# Patient Record
Sex: Male | Born: 1950 | Race: Black or African American | Hispanic: No | Marital: Married | Smoking: Never smoker
Health system: Southern US, Community
[De-identification: ages and names within clinical notes are randomized; demographics above are authoritative.]

## PROBLEM LIST (undated history)

## (undated) DIAGNOSIS — M47812 Spondylosis without myelopathy or radiculopathy, cervical region: Secondary | ICD-10-CM

## (undated) DIAGNOSIS — K648 Other hemorrhoids: Secondary | ICD-10-CM

## (undated) DIAGNOSIS — G587 Mononeuritis multiplex: Secondary | ICD-10-CM

## (undated) DIAGNOSIS — M5417 Radiculopathy, lumbosacral region: Secondary | ICD-10-CM

## (undated) DIAGNOSIS — M1990 Unspecified osteoarthritis, unspecified site: Secondary | ICD-10-CM

## (undated) DIAGNOSIS — N189 Chronic kidney disease, unspecified: Secondary | ICD-10-CM

## (undated) DIAGNOSIS — M961 Postlaminectomy syndrome, not elsewhere classified: Secondary | ICD-10-CM

## (undated) DIAGNOSIS — F329 Major depressive disorder, single episode, unspecified: Secondary | ICD-10-CM

## (undated) DIAGNOSIS — T7840XA Allergy, unspecified, initial encounter: Secondary | ICD-10-CM

## (undated) DIAGNOSIS — F419 Anxiety disorder, unspecified: Secondary | ICD-10-CM

## (undated) DIAGNOSIS — F32A Depression, unspecified: Secondary | ICD-10-CM

## (undated) DIAGNOSIS — M4802 Spinal stenosis, cervical region: Secondary | ICD-10-CM

## (undated) DIAGNOSIS — J189 Pneumonia, unspecified organism: Secondary | ICD-10-CM

## (undated) DIAGNOSIS — I10 Essential (primary) hypertension: Secondary | ICD-10-CM

## (undated) HISTORY — DX: Unspecified osteoarthritis, unspecified site: M19.90

## (undated) HISTORY — DX: Essential (primary) hypertension: I10

## (undated) HISTORY — DX: Depression, unspecified: F32.A

## (undated) HISTORY — DX: Spondylosis without myelopathy or radiculopathy, cervical region: M47.812

## (undated) HISTORY — DX: Postlaminectomy syndrome, not elsewhere classified: M96.1

## (undated) HISTORY — PX: LUMBAR FUSION: SHX111

## (undated) HISTORY — DX: Anxiety disorder, unspecified: F41.9

## (undated) HISTORY — DX: Mononeuritis multiplex: G58.7

## (undated) HISTORY — DX: Major depressive disorder, single episode, unspecified: F32.9

## (undated) HISTORY — DX: Other hemorrhoids: K64.8

## (undated) HISTORY — DX: Spinal stenosis, cervical region: M48.02

## (undated) HISTORY — DX: Chronic kidney disease, unspecified: N18.9

## (undated) HISTORY — DX: Radiculopathy, lumbosacral region: M54.17

## (undated) HISTORY — DX: Allergy, unspecified, initial encounter: T78.40XA

---

## 1998-04-27 ENCOUNTER — Other Ambulatory Visit: Admission: RE | Admit: 1998-04-27 | Discharge: 1998-04-27 | Payer: Self-pay | Admitting: Gastroenterology

## 1999-03-10 ENCOUNTER — Ambulatory Visit (HOSPITAL_COMMUNITY): Admission: RE | Admit: 1999-03-10 | Discharge: 1999-03-10 | Payer: Self-pay | Admitting: Neurosurgery

## 1999-03-24 ENCOUNTER — Ambulatory Visit (HOSPITAL_COMMUNITY): Admission: RE | Admit: 1999-03-24 | Discharge: 1999-03-24 | Payer: Self-pay | Admitting: Neurosurgery

## 1999-04-07 ENCOUNTER — Ambulatory Visit (HOSPITAL_COMMUNITY): Admission: RE | Admit: 1999-04-07 | Discharge: 1999-04-07 | Payer: Self-pay | Admitting: Neurosurgery

## 1999-08-03 ENCOUNTER — Inpatient Hospital Stay (HOSPITAL_COMMUNITY): Admission: RE | Admit: 1999-08-03 | Discharge: 1999-08-10 | Payer: Self-pay | Admitting: Neurosurgery

## 1999-08-04 ENCOUNTER — Encounter: Payer: Self-pay | Admitting: Neurosurgery

## 1999-09-06 ENCOUNTER — Encounter: Admission: RE | Admit: 1999-09-06 | Discharge: 1999-09-06 | Payer: Self-pay | Admitting: Neurosurgery

## 1999-11-28 ENCOUNTER — Encounter: Admission: RE | Admit: 1999-11-28 | Discharge: 1999-11-28 | Payer: Self-pay | Admitting: Neurosurgery

## 1999-12-04 ENCOUNTER — Encounter: Admission: RE | Admit: 1999-12-04 | Discharge: 2000-02-08 | Payer: Self-pay | Admitting: Neurosurgery

## 2000-02-06 ENCOUNTER — Encounter: Admission: RE | Admit: 2000-02-06 | Discharge: 2000-02-06 | Payer: Self-pay | Admitting: Neurosurgery

## 2000-07-19 ENCOUNTER — Encounter: Admission: RE | Admit: 2000-07-19 | Discharge: 2000-07-19 | Payer: Self-pay | Admitting: Neurosurgery

## 2000-07-30 ENCOUNTER — Ambulatory Visit (HOSPITAL_COMMUNITY): Admission: RE | Admit: 2000-07-30 | Discharge: 2000-07-30 | Payer: Self-pay | Admitting: Neurosurgery

## 2008-01-21 ENCOUNTER — Emergency Department (HOSPITAL_COMMUNITY): Admission: EM | Admit: 2008-01-21 | Discharge: 2008-01-21 | Payer: Self-pay | Admitting: *Deleted

## 2008-04-15 ENCOUNTER — Encounter: Admission: RE | Admit: 2008-04-15 | Discharge: 2008-04-15 | Payer: Self-pay | Admitting: Nephrology

## 2008-08-04 ENCOUNTER — Encounter: Admission: RE | Admit: 2008-08-04 | Discharge: 2008-08-04 | Payer: Self-pay | Admitting: Neurosurgery

## 2008-09-30 ENCOUNTER — Encounter: Admission: RE | Admit: 2008-09-30 | Discharge: 2008-09-30 | Payer: Self-pay | Admitting: Neurosurgery

## 2008-10-28 ENCOUNTER — Encounter: Admission: RE | Admit: 2008-10-28 | Discharge: 2008-12-14 | Payer: Self-pay | Admitting: Neurosurgery

## 2009-01-25 ENCOUNTER — Encounter
Admission: RE | Admit: 2009-01-25 | Discharge: 2009-04-25 | Payer: Self-pay | Admitting: Physical Medicine & Rehabilitation

## 2009-01-31 ENCOUNTER — Ambulatory Visit: Payer: Self-pay | Admitting: Physical Medicine & Rehabilitation

## 2009-02-28 ENCOUNTER — Ambulatory Visit: Payer: Self-pay | Admitting: Physical Medicine & Rehabilitation

## 2009-03-15 ENCOUNTER — Ambulatory Visit: Payer: Self-pay | Admitting: Physical Medicine & Rehabilitation

## 2009-04-12 ENCOUNTER — Ambulatory Visit: Payer: Self-pay | Admitting: Physical Medicine & Rehabilitation

## 2009-04-12 ENCOUNTER — Ambulatory Visit (HOSPITAL_COMMUNITY)
Admission: RE | Admit: 2009-04-12 | Discharge: 2009-04-12 | Payer: Self-pay | Admitting: Physical Medicine & Rehabilitation

## 2009-04-27 ENCOUNTER — Encounter: Admission: RE | Admit: 2009-04-27 | Discharge: 2009-04-27 | Payer: Self-pay | Admitting: Nephrology

## 2009-05-09 ENCOUNTER — Encounter
Admission: RE | Admit: 2009-05-09 | Discharge: 2009-06-09 | Payer: Self-pay | Admitting: Physical Medicine & Rehabilitation

## 2009-05-10 ENCOUNTER — Ambulatory Visit: Payer: Self-pay | Admitting: Physical Medicine & Rehabilitation

## 2009-06-09 ENCOUNTER — Ambulatory Visit: Payer: Self-pay | Admitting: Physical Medicine & Rehabilitation

## 2009-06-15 ENCOUNTER — Ambulatory Visit: Payer: Self-pay | Admitting: Vascular Surgery

## 2009-07-06 ENCOUNTER — Encounter
Admission: RE | Admit: 2009-07-06 | Discharge: 2009-10-04 | Payer: Self-pay | Admitting: Physical Medicine & Rehabilitation

## 2009-07-07 ENCOUNTER — Ambulatory Visit: Payer: Self-pay | Admitting: Physical Medicine & Rehabilitation

## 2009-08-03 ENCOUNTER — Ambulatory Visit: Payer: Self-pay | Admitting: Physical Medicine & Rehabilitation

## 2009-09-05 ENCOUNTER — Ambulatory Visit: Payer: Self-pay | Admitting: Physical Medicine & Rehabilitation

## 2009-09-27 ENCOUNTER — Encounter
Admission: RE | Admit: 2009-09-27 | Discharge: 2009-12-26 | Payer: Self-pay | Admitting: Physical Medicine & Rehabilitation

## 2009-10-04 ENCOUNTER — Ambulatory Visit: Payer: Self-pay | Admitting: Physical Medicine & Rehabilitation

## 2009-11-02 ENCOUNTER — Ambulatory Visit: Payer: Self-pay | Admitting: Physical Medicine & Rehabilitation

## 2009-11-30 ENCOUNTER — Ambulatory Visit: Payer: Self-pay | Admitting: Physical Medicine & Rehabilitation

## 2009-12-19 ENCOUNTER — Encounter
Admission: RE | Admit: 2009-12-19 | Discharge: 2010-03-19 | Payer: Self-pay | Admitting: Physical Medicine & Rehabilitation

## 2009-12-27 ENCOUNTER — Ambulatory Visit: Payer: Self-pay | Admitting: Physical Medicine & Rehabilitation

## 2010-01-03 ENCOUNTER — Ambulatory Visit (HOSPITAL_COMMUNITY)
Admission: RE | Admit: 2010-01-03 | Discharge: 2010-01-03 | Payer: Self-pay | Admitting: Physical Medicine & Rehabilitation

## 2010-01-27 ENCOUNTER — Ambulatory Visit: Payer: Self-pay | Admitting: Physical Medicine & Rehabilitation

## 2010-02-28 ENCOUNTER — Ambulatory Visit: Payer: Self-pay | Admitting: Physical Medicine & Rehabilitation

## 2010-03-22 ENCOUNTER — Encounter
Admission: RE | Admit: 2010-03-22 | Discharge: 2010-06-20 | Payer: Self-pay | Source: Home / Self Care | Attending: Physical Medicine & Rehabilitation | Admitting: Physical Medicine & Rehabilitation

## 2010-03-28 ENCOUNTER — Ambulatory Visit: Payer: Self-pay | Admitting: Physical Medicine & Rehabilitation

## 2010-04-28 ENCOUNTER — Ambulatory Visit: Payer: Self-pay | Admitting: Physical Medicine & Rehabilitation

## 2010-05-31 ENCOUNTER — Ambulatory Visit: Payer: Self-pay | Admitting: Physical Medicine & Rehabilitation

## 2010-06-28 ENCOUNTER — Encounter
Admission: RE | Admit: 2010-06-28 | Discharge: 2010-07-11 | Payer: Self-pay | Source: Home / Self Care | Attending: Physical Medicine & Rehabilitation | Admitting: Physical Medicine & Rehabilitation

## 2010-07-04 ENCOUNTER — Ambulatory Visit
Admission: RE | Admit: 2010-07-04 | Discharge: 2010-07-04 | Payer: Self-pay | Source: Home / Self Care | Attending: Physical Medicine & Rehabilitation | Admitting: Physical Medicine & Rehabilitation

## 2010-08-02 ENCOUNTER — Encounter: Payer: Medicaid Other | Attending: Physical Medicine & Rehabilitation

## 2010-08-02 ENCOUNTER — Ambulatory Visit (HOSPITAL_BASED_OUTPATIENT_CLINIC_OR_DEPARTMENT_OTHER): Payer: Medicaid Other

## 2010-08-02 DIAGNOSIS — M47812 Spondylosis without myelopathy or radiculopathy, cervical region: Secondary | ICD-10-CM

## 2010-08-02 DIAGNOSIS — M961 Postlaminectomy syndrome, not elsewhere classified: Secondary | ICD-10-CM

## 2010-08-02 DIAGNOSIS — E1149 Type 2 diabetes mellitus with other diabetic neurological complication: Secondary | ICD-10-CM | POA: Insufficient documentation

## 2010-08-02 DIAGNOSIS — IMO0002 Reserved for concepts with insufficient information to code with codable children: Secondary | ICD-10-CM

## 2010-08-02 DIAGNOSIS — M129 Arthropathy, unspecified: Secondary | ICD-10-CM | POA: Insufficient documentation

## 2010-08-02 DIAGNOSIS — M4802 Spinal stenosis, cervical region: Secondary | ICD-10-CM

## 2010-08-02 DIAGNOSIS — I10 Essential (primary) hypertension: Secondary | ICD-10-CM | POA: Insufficient documentation

## 2010-08-02 DIAGNOSIS — M359 Systemic involvement of connective tissue, unspecified: Secondary | ICD-10-CM | POA: Insufficient documentation

## 2010-09-01 ENCOUNTER — Encounter: Payer: Medicaid Other | Attending: Physical Medicine & Rehabilitation

## 2010-09-01 ENCOUNTER — Ambulatory Visit (HOSPITAL_BASED_OUTPATIENT_CLINIC_OR_DEPARTMENT_OTHER): Payer: Medicaid Other | Admitting: Physical Medicine & Rehabilitation

## 2010-09-01 DIAGNOSIS — I10 Essential (primary) hypertension: Secondary | ICD-10-CM | POA: Insufficient documentation

## 2010-09-01 DIAGNOSIS — M961 Postlaminectomy syndrome, not elsewhere classified: Secondary | ICD-10-CM

## 2010-09-01 DIAGNOSIS — R209 Unspecified disturbances of skin sensation: Secondary | ICD-10-CM

## 2010-09-01 DIAGNOSIS — E1149 Type 2 diabetes mellitus with other diabetic neurological complication: Secondary | ICD-10-CM | POA: Insufficient documentation

## 2010-09-01 DIAGNOSIS — M359 Systemic involvement of connective tissue, unspecified: Secondary | ICD-10-CM | POA: Insufficient documentation

## 2010-09-01 DIAGNOSIS — M47812 Spondylosis without myelopathy or radiculopathy, cervical region: Secondary | ICD-10-CM | POA: Insufficient documentation

## 2010-09-01 DIAGNOSIS — G587 Mononeuritis multiplex: Secondary | ICD-10-CM

## 2010-09-01 DIAGNOSIS — M129 Arthropathy, unspecified: Secondary | ICD-10-CM | POA: Insufficient documentation

## 2010-09-29 ENCOUNTER — Encounter: Payer: Medicaid Other | Attending: Neurosurgery | Admitting: Neurosurgery

## 2010-09-29 DIAGNOSIS — G8929 Other chronic pain: Secondary | ICD-10-CM | POA: Insufficient documentation

## 2010-09-29 DIAGNOSIS — M48061 Spinal stenosis, lumbar region without neurogenic claudication: Secondary | ICD-10-CM

## 2010-09-29 DIAGNOSIS — E119 Type 2 diabetes mellitus without complications: Secondary | ICD-10-CM | POA: Insufficient documentation

## 2010-09-29 DIAGNOSIS — M5430 Sciatica, unspecified side: Secondary | ICD-10-CM

## 2010-09-29 DIAGNOSIS — M961 Postlaminectomy syndrome, not elsewhere classified: Secondary | ICD-10-CM | POA: Insufficient documentation

## 2010-09-29 DIAGNOSIS — G5790 Unspecified mononeuropathy of unspecified lower limb: Secondary | ICD-10-CM | POA: Insufficient documentation

## 2010-09-29 DIAGNOSIS — M79609 Pain in unspecified limb: Secondary | ICD-10-CM | POA: Insufficient documentation

## 2010-09-29 DIAGNOSIS — M545 Low back pain, unspecified: Secondary | ICD-10-CM | POA: Insufficient documentation

## 2010-09-30 NOTE — Assessment & Plan Note (Signed)
The patient follows up today with chronic low back pain, lower extremity pain which is probably neuropathic.  CHIEF COMPLAINT:  Pain in lower extremities with numbness.  HISTORY:  This is a 60 year old man with diabetes.  He has been followed here for sometime for the above diagnoses.  He has a mononeuropathy multiplex pattern.  His rates his pain today at about 8.  General activity level is 3-4. His pain is worse in the morning.  He sleeps poor.  Walking, bending, and most activities increases pain.  Heat, rest, and medications help. With mobility, he walks without assistance.  He does have a slightly limp.  He does not climb steps and does not drive.  He transfers alone. He is not employed and he does most of his own ADLs and shopping. Neuropsych, he does complain of numbness, tingling, trouble walking, spasms, dizziness, confusion, depression, anxiety, and loss of taste and smell and some weakness.  REVIEW OF SYSTEMS:  Notable for some weight loss, high and low blood sugar fluctuations, constipation, poor appetite, sleep apnea, and wheezing.  PAST MEDICAL HISTORY:  Unchanged.  SOCIAL HISTORY:  Unchanged.  Pill counts today were correct.  No signs of aberrant behavior.  PHYSICAL EXAMINATION:  Vital Signs:  His blood pressure is 90/60, pulse 62, respirations 18, and O2 sats 100 on room air.  Neurologic:  Motor strength is about 4+/5 in the left lower extremity.  Right lower extremity is 5/5.  He can only flex to 45 degrees and extend about 5 degrees.  Loss of motion score today was 70.  He has mild tenderness to palpation across the lumbar spine, paraspinous musculature. Sensation appears to be diminished in lower extremities.  He has no new complaints.  He is stable as he is.  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  He will continue his Cymbalta and Neurontin as scheduled.  They did gave him refills today  of oxycodone 7.5/325 one p.o. t.i.d. 90 with no refill, and Opana ER 20 mg day 1 p.o. b.i.d. 60 with no refill.  We will see him back in a month.     Anthony Moran    RLW/MedQ D:  09/29/2010 14:03:16  T:  09/30/2010 03:31:49  Job #:  [id number]

## 2010-10-24 NOTE — Assessment & Plan Note (Signed)
OFFICE VISIT   Anthony Moran, Anthony Moran  DOB:  1950/10/10                                       06/15/2009  BJYNW#:[id number]   CHIEF COMPLAINT:  Bilateral lower extremity pain.   HISTORY OF PRESENT ILLNESS:  The patient is a 60 year old male referred  by Dr. Wynn Banker for bilateral lower extremity pain and occasional  swelling.  Chronic problems include chronic back pain, hypertension and  diabetes.  These are all currently controlled.  The patient denies  history of smoking.  The patient states that both legs hurt, with pain  starting in his back and then radiating down the back of his legs all  the way into his feet.  He has intermittent to constant numbness and  tingling in his right foot.  He also states that the muscles of his  right foot tightened up.  He states that this occurs to the point that  he cannot walk.  It can occur sometimes at rest, sometimes with walking.  The problems in both lower extremities have become slowly worse over the  last several years.  He has been followed for this by Dr. Wynn Banker  since October.  He had a lumbar fusion 10 years ago.  This was done by  Dr. Lovell Sheehan.  He apparently saw Dr. Lovell Sheehan recently, and they are  considering whether to do a redo lumbar operation.  He has now been  diagnosed with diabetes for 1 year.  He denies tobacco abuse.  He denies  any prior trauma to his lower extremities or prior history of DVT.  He  does not really complain of considerable swelling at this point.  He  does not describe classic claudication or rest pain type symptoms.   PAST MEDICAL HISTORY:  As mentioned above.   PAST SURGICAL HISTORY:  As mentioned above.   FAMILY HISTORY:  Negative.   SOCIAL HISTORY:  He is married and has 2 children.  He is not working  because of his overall disability from his back and legs.  He is a  nonsmoker, nonconsumer of alcohol.   REVIEW OF SYSTEMS:  A full 12-point review of systems was performed  with  the patient.  Please see intake referral form for details regarding  this.   MEDICATIONS:  Gabapentin, Opana, oxycodone, metformin, Hyzaar,  clonidine, Flomax, amlodipine and sulindac.   LABORATORY DATA:  He has no known drug allergies.   PHYSICAL EXAM:  Today, blood pressure is 140/78 in the left arm,  temperature is 97.6, heart rate 70 and regular.  HEENT:  Unremarkable.  Neck:  Has 2+ carotid pulses without lymphadenopathy or bruit.  He has  no JVD.  Chest:  Clear to auscultation.  Cardiac:  Regular rate rhythm  without murmur.  Abdomen:  Soft, nontender, nondistended.  No masses.  Extremities:  He has 2+ radial, femoral and dorsalis pedis pulses  bilaterally.  Neurologic:  Upper extremity and lower extremity motor  strength is 4/5 and symmetric bilaterally.  He has slight decreased  sensation in the right foot, and this extends up to the midportion of  the right leg.  He does have some decreased sensation in the left but  the right is worse.  Skin:  Has no ulcers or rashes.   He had bilateral ABIs performed today which had triphasic waveforms and  an ABI greater than  1 bilaterally, 1.14 on the left and 1.24 on the  right.   I had a lengthy discussion with the patient today.  I believe that the  problems in his lower extremities are probably multifactorial in nature  but most likely related to his back.  He has a normal arterial physical  exam and normal ABIs.  I do not believe this is related to a venous or  arterial problem.  He may have some mild neuropathy from his diabetes as  well.  This was diagnosed a year ago but could have been present for  some time prior to that, as the patient states he had not seen a doctor  in several years before this.  I believe the best option is tight  control of his diabetes, and he will consider whether or not he wishes  further intervention per Dr. Lovell Sheehan on his back.  As far as his blood  vessels are concerned, he has a normal  circulatory exam at this point  and he would be able to follow up with me at any time in the future if  you thought necessary.  Otherwise, I have not scheduled him for a  followup appointment.     Janetta Hora. Fields, MD  Electronically Signed   CEF/MEDQ  D:  06/15/2009  T:  06/16/2009  Job:  [id number]   cc:   [name], M.D.  [name], M.D.  Jarome Matin, M.D.

## 2010-10-24 NOTE — Group Therapy Note (Signed)
Anthony Moran is a 60 year old male with back pain greater than neck pain.  He has history of lumbar fusion at L3-4 and L4-5.  He has pain in the  buttocks area but in lesser amounts in the mid back area.  He also has  neck pain and has had MRI of the cervical spine for further evaluation  and has undergone neurosurgical evaluation per Dr. Lovell Sheehan.  He was  found to have moderate spondylosis but also has moderate foraminal  stenosis on the left and severe foraminal stenosis on the right at C5  and severe right C6 foraminal stenosis on the right side, and moderate  foraminal stenosis C6-7 bilaterally and mild right C8 foraminal  stenosis.  He has elected not to undergo surgery for his cervical  spondylosis.   The patient is looking for nonsurgical alternatives to his pain  management.  He has seen his primary physician, Dr. Leretha Dykes, and has been  treated with oxycodone 5/500 t.i.d., sulindac 200 mg b.i.d., and  gabapentin 100 mg a day.   His pain level is 9-10/10, interferes with activity at 10/10, sleep is  poor.  He has some lower extremity pain as well as right hand and left  wrist pain.  He has some problems doing certain dressing, bathing,  household duties, and shopping.   His review of systems, weakness, numbness, tremor, tingling, trouble  walking, spasms, dizziness, confusion, depression, anxiety,  constipation, urine retention, respiratory problems, wheezing.  He does  see Dr. Leretha Dykes for most of these issues and has seen Neurosurgery as  well.   PAST MEDICAL HISTORY:  Diabetes, high blood pressure, arthritis.   SOCIAL HISTORY:  Married, lives with his wife.  He is applying for  disability.   FAMILY HISTORY:  Diabetes and high blood pressure.   PHYSICAL EXAMINATION:  VITAL SIGNS:  Blood pressure 137/77, pulse 68,  respirations 18, sat 100% on room air.  GENERAL:  No acute distress.  Orientation x3.  Affect is alert.  Gait is  normal.  EXTREMITIES:  Without edema.   Coordination normal.  Sensation is reduced  right S1 dermatome.  He has a decreased right S1 reflex, other reflexes  are normal.  Strength is normal in bilateral lower extremities with the  exception of plantar flexor strength on the right side.  He has more  difficulty doing toe raises with the right lower extremity.  Range of  motion in the hips, knees, and ankles as well as shoulders, elbows,  wrists are normal.  MUSCULOSKELETAL:  Motor strength is full in the upper extremities.  Neck  range of motion is about 50% forward flexion, extension, lateral  rotation, and bending.  Lumbar range of motion 25%, range of motion for  flexion and extension.   IMPRESSION:  1. 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  2. Cervical spondylosis with cervical stenosis.  Some radicular      symptoms in the upper extremities as well.   PLAN:  1. We will recommend increasing his gabapentin to 300 mg t.i.d.  2. Trial of tramadol, his pain is not as bad in the mid day.  He      should take the OxyContin in the morning, take the tramadol at      noon, and oxycodone in p.m., I will see him back.  3. Right S1 transforaminal injection under fluoroscopic guidance.  I      will see him back in about 2-3  weeks with injection.  Follow up on      how he is doing with his other medications.  We will likely need to      start some physical therapy after injection.   We also discussed his goals.  He would like to get back to driving and  really has not driven.  I do not see why he could not drive based on his  impairments.      [name], M.D.  Electronically Signed     AEK/MedQ  D:  01/31/2009 15:28:23  T:  02/01/2009 06:35:28  Job #:  [id number]   cc:   [name], M.D.  Fax: [phone]   Dr. Leretha Dykes

## 2010-10-25 ENCOUNTER — Encounter: Payer: Medicaid Other | Attending: Neurosurgery | Admitting: Neurosurgery

## 2010-10-25 DIAGNOSIS — G8929 Other chronic pain: Secondary | ICD-10-CM | POA: Insufficient documentation

## 2010-10-25 DIAGNOSIS — M961 Postlaminectomy syndrome, not elsewhere classified: Secondary | ICD-10-CM | POA: Insufficient documentation

## 2010-10-25 DIAGNOSIS — G894 Chronic pain syndrome: Secondary | ICD-10-CM

## 2010-10-25 DIAGNOSIS — M79609 Pain in unspecified limb: Secondary | ICD-10-CM | POA: Insufficient documentation

## 2010-10-25 DIAGNOSIS — E119 Type 2 diabetes mellitus without complications: Secondary | ICD-10-CM | POA: Insufficient documentation

## 2010-10-25 DIAGNOSIS — M545 Low back pain, unspecified: Secondary | ICD-10-CM | POA: Insufficient documentation

## 2010-10-25 DIAGNOSIS — G5790 Unspecified mononeuropathy of unspecified lower limb: Secondary | ICD-10-CM | POA: Insufficient documentation

## 2010-10-25 DIAGNOSIS — M542 Cervicalgia: Secondary | ICD-10-CM

## 2010-10-26 NOTE — Assessment & Plan Note (Signed)
This is a patient who sees Dr. Wynn Banker for chronic low back pain and lower extremity pain which he considers to be neuropathic.  He rates his pain as 8 or 9 sharp, stabbing, tingling and aching and constant. Activity level is 8-10.  Pain is worse in the morning.  Sleep patterns are poor.  All activities are aggravated at rest and heat, medication tend to help.  He walks without assistance, only can walk 1-3 minutes. He does not climb steps or drive.  REVIEW OF SYSTEMS:  Notable for those difficulties as well as weight loss or gain, night sweats, high and low blood sugar, diarrhea, constipation, limb swelling, wheezing, trouble with ambulation, confusion, depression, anxiety, loss of taste, smell, numbness, tingling and paresthesias.  Review of systems is otherwise unchanged.  PAST MEDICAL HISTORY:  Unchanged.  SOCIAL HISTORY:  Unchanged.  There are no signs of aberrant behavior. His pill counts are correct.  PHYSICAL EXAMINATION:  Shows his blood pressure to be 148/83, pulse 80, respirations 20 and O2 sats 100 on room air.  His motor strength is 5/5 in lower extremities.  Positive sensation.  He is alert and oriented x3. Constitutionally, he was within normal limits.  DIAGNOSES: 1. Post laminectomy syndrome. 2. Chronic pain with neuropathy in lower extremities.  PLAN:  We will continue his Cymbalta.  He got a refill on that today 60 mg one p.o. q.a.m. 30 with 3 refills, oxycodone 7.5/325 one p.o. t.i.d. 90 with no refill, Opana ER 20 mg one p.o. b.i.d. 60 with no refill. His plan will be followed up here in the office in 1 month.  His questions were encouraged and answered.  He did mention by the way that Dr. Lovell Sheehan and Delma Officer did want to operate on cervical spine and lumbar spine, but he is declining at this time.     Tamekia Rotter L. Blima Dessert Electronically Signed    RLW/MedQ D:  10/25/2010 15:33:07  T:  10/26/2010 02:06:38  Job #:  [id number]

## 2010-10-27 NOTE — H&P (Signed)
[place]. [facility]  Patient:    Anthony Moran, Anthony Moran                      MRN: [id number] Adm. Date:  [id number] Attending:  Tressie Stalker D                         History and Physical  CHIEF COMPLAINT:  Back pain.  HISTORY OF PRESENT ILLNESS:  The patient is a 60 year old black male who was in his usual state of good health until May 26, 1998. At that time, he was at work; and he was moving a desk through a door; and he felt a pop in his back. He has een incapacitated by back pain since then. He has been seen by several physicians for this, and the injury was turned into workmans compensation. He has been treated  with multiple modalities, including physical therapy, rest, work restrictions, steroid injections, etc. Eventually, he failed all modalities and went on to have a lumbar MRI. It demonstrated degenerative disease, disk herniation, etc. at L4-5 and L5-S1.  I first saw the patient September 2000. I have treated him extensively with medications. He has not improved significantly; and I, therefore, discussed the  surgical options, including a two-level lumbar decompression, stabilization, and fusion. The patient weighed the risks, benefits, alternatives to surgery and decided to proceed with the operation.  PAST MEDICAL HISTORY:  Negative.  PAST SURGICAL HISTORY:  None.  ALLERGIES:  No known drug allergies.  MEDICATIONS PRIOR TO ADMISSION:  Vioxx p.r.n., Flexeril t.i.d., p.r.n. analgesics.  FAMILY MEDICAL HISTORY:  The patients mother is age 56. She has diabetes mellitus and high blood pressure. The patients father is 18 and in good health.  SOCIAL HISTORY:  Lives in [place]. The patient is married. He has two children. Denies tobacco, ethanol, drug use. He is employed at [employer].  REVIEW OF SYSTEMS:  Negative except as above.  PHYSICAL EXAMINATION:  GENERAL:  A pleasant, moderately obese 60 year old black male in no  apparent distress.  VITAL SIGNS:  Height 5 feet 10 inches, weight 225 pounds.  HEENT:  Normocephalic, atraumatic. Pupils are equal, round, and reactive to light. Extraocular muscles intact. Sclerae white. Conjunctivae pink. Oropharynx benign. Uvula midline.  NECK:  Supple. There are no masses, meningismus, deformities, tracheal deviation, jugular venous distension, carotid bruits. He has normal cervical range of motion. His Spurling test is negative. Lhermitte sign was not present.  THORAX:  Symmetric.  LUNGS:  Clear to auscultation without rales, rhonchi, or wheezes.  HEART:  Regular rate and rhythm.  ABDOMEN:  Soft and nontender. No guarding or rebound.  BACK:  No point tenderness. He has some mild tenderness to palpation in the right paraspinous muscular region. There are deformities. Straight-leg raise test and  fabere testing are negative bilaterally.  EXTREMITIES:  No obvious deformities.  NEUROLOGICAL:  The patient is alert and oriented x 3. Cranial nerves II through XII are grossly intact bilaterally. Vision and hearing grossly normal bilaterally.  MOTOR AND SENSORY:  Motor strength is 5/5 in bilateral deltoid, bicep, tricep, and grip, wrist extensor, ______, iliopsoas, quadriceps, gastrocnemius, extensor hallucis longus. Cerebellar exam is intact throughout to rapid alternating movements of the upper extremities bilaterally. Sensory exam is normal to light  touch and pinprick sensation. All toes are downgoing bilaterally. Deep tendon reflexes are 2/4 in his bilateral biceps, triceps, brachioradialis, quadriceps, gastrocnemius. He has bilateral flexor plantar reflexes. No ankle clonus.  LABORATORY DATA:  I have reviewed the patients lumbar MRI performed at [facility] on September 20, 1998. The sagittal image demonstrates no fractures or subluxation. He has degenerative disk disease and central disk herniation at L4-5 and L5-S1.  ASSESSMENT AND PLAN:  L4-5  and L5-S1 degenerative disk disease. Spondylosis. Herniated nucleus pulposus. I have discussed the situation extensively with the  patient and his wife. I have discussed the various treatment options, including  doing nothing, continuing medical management, and I have discussed surgical options. I discussed a simple microdiskectomy versus a L4-5 and L5-S1 posterior  lumbar interbody fusion with pedicle screw stabilization and interbody cages. I  showed him surgical models and discussed the risks of surgery extensively. The patient has failed medical management thus far and has, therefore, weighed the risks, benefits, alternatives to surgery and wants to proceed with the lumbar decompression, stabilization, and fusion on August 03, 1999. I will make the necessary arrangements. DD:  08/03/99 TD:  08/03/99 Job: [id number]
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
## 2010-10-27 NOTE — Procedures (Signed)
NAME:  Anthony Moran, Anthony Moran             ACCOUNT NO.:  [id number]   MEDICAL RECORD NO.:  [id number]          PATIENT TYPE:  REC   LOCATION:  [facility]                          FACILITY:  [facility]   PHYSICIAN:  [name], M.D.DATE OF BIRTH:  [birth year]   DATE OF PROCEDURE:  DATE OF DISCHARGE:                               OPERATIVE REPORT   A 59 year old male with back pain, neck pain, history of lumbar fusion  at L3-4, L4-5, persistent right S1 radiculitis.   PROCEDURE:  Right S1 transforaminal lumbar epidural steroid injection  under fluoroscopic guidance.   INDICATIONS:  Right lower extremity radiculitis interfering with  mobility and only partially responsive to medication management.  Informed consent was obtained after describing the risks and benefits of  the procedure with the patient.  These include bleeding, bruising,  infection.  He elects to proceed and has given written consent.  The  patient was placed prone on fluoroscopy table.  Betadine prep, sterile  draped.  A 25-gauge 1-1/2-inch needle was used to anesthetize the skin  and subcutaneous tissue with 1% lidocaine x2 mL.  Then, a 22-gauge 3-1/2-  inch spinal needle was inserted to the right S1 foramen under AP,  lateral and oblique imaging.  Omnipaque 180 under live fluoro  demonstrated good epidural spread followed by injection of 1 mL of 1%  MPF lidocaine, 1 mL of 10 mg/mL dexamethasone.  The patient tolerated  the procedure well.  Pre- and Post-injection vitals stable.  Post-  injection instructions given.  UDS reviewed consistent.  No relief from  tramadol.  We will trial hydrocodone.  Discussed with the patient,  agrees with plan.      [name], M.D.  Electronically Signed     AEK/MEDQ  D:  02/28/2009 12:10:26  T:  03/01/2009 04:39:27  Job:  [id number]

## 2010-10-27 NOTE — Op Note (Signed)
. [facility]  Patient:    Anthony Moran, Anthony Moran                      MRN: [id number] Proc. Date: 08/03/99 Adm. Date:  [id number] Attending:  Tressie Stalker D                           Operative Report  ADDENDUM:  DESCRIPTION OF PROCEDURE:  I then used the Midas Rex high speed drill to perform a L4/5 laminotomy and foraminotomy, i.e. I drilled the caudal edge of L4 and L5 until I encountered associated ligamentum flavum.  I then removed the ligamentum flavum and with Kerrison punch widened the laminotomy at L4 and L5.  I then removed the medial aspect of the L4/5 and L5-S1 facet joint.  I hit the inferior L4 and L5 facet and part of the superior facet of L5 and S1.  I saved his bone for later morcellized and use in the fusion.  I got a good exposure of the  thecal sac, L5, and extraforaminal L4 nerve root at L4/5 and the thecal sac, S1  nerve root, and the extraforaminal L5 nerve root at L5-S1.  I did this bilaterally. I repeated this procedure on the right side.  At this point, the thecal sac and nerve roots were well decompressed.  I then carefully retracted the thecal sac at L4/5 on the left medially and incised it o the left L4/5 intervertebral disc and performed an aggressive discectomy using he forceps, Ebstein, and Scoville curets.  I repeated this procedure on the right ide scraping the vertebral endplates with the Scoville curet.  I then repeated this procedure at L5-S1.  Of note, this was quite collapsed.  I  could barely even get a pencil of #4 into the disc space but I was able to incise into the disc and then remove the disc partially with pituitary forceps.  I then was able to get a vertebral body spreader into this space and distract L5-S1 and get a better disc on the left side and then a more aggressive discectomy on the  right side.  I then removed the vertebral body spreader and put on the right side and performed an  aggressive discectomy on the left side.  At this point, I had the thecal sac, the bilateral L4, L5, and S1 nerve roots were well decompressed.  I now turned my attention to the posterior lumbar interbody  fusion.  I brought the fluoroscope into the field and under its guidance I placed the instrumentation.  I carefully retracted thecal sac and the left S1 nerve root medially at L5-S1 and the L5 nerve root laterally and then inserted the 12 mm tang retractor under fluoroscopic guidance.  Of note, in order to get a tang retractor in there, I did have to draw off some of the L5 pedicle and the upper part of the S1 pedicle.  I then drilled out the interspace, tapped it, inserted a 12 by 26 mm interbody cage.  I removed the tang retractor and then inspected the thecal sac and the left S1 and L5 nerve root for any damage and there was none.  I then repeated this procedure identically on the right side.  I placed a 12 by 26 mm cage on the right.  I then repeated this procedure at L4/5.  I retracted the left L4 thecal sac and L5 nerve root medially,  the L4 nerve root laterally, inserted the 16 mm tang retractor into the left L4/5 interspace under fluoroscopic guidance.  I then drilled out he interspace, tapped it, and inserted a 16 by 26 mm interbody cage on the left and then removed the tang retractor and inspected the thecal sac and the left L5 and L4 nerve root for any damage.  There was none.  I then repeated this procedure at 4/5 on the right, i.e. I inserted a 16 by 26 mm cage on the right side.  Having completed the insertion of the titanium interbody cages, I now completed the posterior lumbar interbody fusion by filling the cages with the local morcellized autograft bone which was mixed with allograft bone and packed the cages tightly  with them and then placed the plastic caps over the cages.  I now turned by attention to the posterior segmental spinal  instrumentation.  I  used electrocautery to expose the bilateral L4, L5 transverse processes.  I then used the Midas Rex high speed drill to decorticate just posterior to the bilateral L4, L5, and S1 pedicles.  I then cannulated the pedicles with the pedicle finder under fluoroscopic guidance.  I then tapped the pedicles and then felt about the tap site and made sure I was well within cortical bone and then inserted 6.75 by 50 mm pedicle screws bilaterally at L4, L5, and 6.75 by 40 mm pedicle screws at S1. Once again, this was done under fluoroscopic guidance.  I then palpated about the pedicles with the long nerve hooks and noted that the  screws were well within the bone and there was no compression on the nerves.  I then obtained a 7 cm rod and then connected the left screws and secured them ith the locking caps and then connected the right screws with a another 7 cm rod and secured them with locking caps.  I then incised the L4/5 interspinous ligament and removed the caudal edge of the L4 spinous process so that I could place a cross connector.  I then placed the cross connector to connect the left and right rods and secured them appropriately.  I then copiously irrigated the wound with bacitracin solution, removed the solution, and then stringent hemostasis with bipolar electrocautery.  I then used the Midas Rex high speed drill to decorticate some of the remaining lateral masses and the S1, L5, and L4 lamina.  I then laid a combination of local morcellized autograft bone and cancellous allograft bone and Osteoblast along the lateral recess, i.e. I performed a posterolateral arthrodesis.  I then removed reinspected the L4/5 thecal sac and the bilateral L5 and extraforaminal L4 nerve roots as well as the bilateral L5 and S1 and bilateral 5 and S1 nerve roots and noted they were well decompressed.  I then removed the retractor and reapproximated the bases of the  thoracolumbar fascia with interrupted #1 Vicryl, subcutaneous tissue with interrupted 2-0 Vicryl, skin with Steri-Strips and benzoin.  The wound was then coated with bacitracin ointment.  Sterile dressing was applied.  The drains were removed.   The patient was subsequently extubated by the anesthesia team and transported to the postanesthesia care unit in stable condition.  All sponge, needle, and instrument counts were correct at the end of this case. DD:  08/03/99 TD:  08/04/99 Job: [id number]

## 2010-11-22 ENCOUNTER — Ambulatory Visit: Payer: Medicaid Other

## 2010-11-22 ENCOUNTER — Encounter: Payer: Medicaid Other | Attending: Neurosurgery | Admitting: Neurosurgery

## 2010-11-22 DIAGNOSIS — M545 Low back pain, unspecified: Secondary | ICD-10-CM | POA: Insufficient documentation

## 2010-11-22 DIAGNOSIS — G8929 Other chronic pain: Secondary | ICD-10-CM | POA: Insufficient documentation

## 2010-11-22 DIAGNOSIS — M79609 Pain in unspecified limb: Secondary | ICD-10-CM | POA: Insufficient documentation

## 2010-11-22 DIAGNOSIS — M961 Postlaminectomy syndrome, not elsewhere classified: Secondary | ICD-10-CM

## 2010-11-22 DIAGNOSIS — E1142 Type 2 diabetes mellitus with diabetic polyneuropathy: Secondary | ICD-10-CM

## 2010-11-22 DIAGNOSIS — G5790 Unspecified mononeuropathy of unspecified lower limb: Secondary | ICD-10-CM | POA: Insufficient documentation

## 2010-11-22 DIAGNOSIS — E119 Type 2 diabetes mellitus without complications: Secondary | ICD-10-CM | POA: Insufficient documentation

## 2010-11-23 NOTE — Assessment & Plan Note (Signed)
HISTORY OF PRESENT ILLNESS:  [name] follows up here today for his chronic low back pain and lower extremity pain which is probably neuropathic due to his diabetes.  He rates his pain today at about an 8 to sharp, stabbing, tingling, and aching type pain.  His general activity level is about 3.  His pain is worse in the morning.  Sleep patterns are poor.  All activities aggravate this pain.  Rest and heat tend to help.  REVIEW OF SYSTEMS:  Notable for those difficulties as well as some weight loss fluctuations, night sweats, blood sugar fluctuations, nausea, vomiting, constipation, abdominal pain, poor appetite, respiratory infection, and sleep apnea.  He has trouble paresthesias, confusion, depression, and anxiety.  No suicidal thoughts and no apparent aberrant behaviors.  PAST MEDICAL HISTORY:  Unchanged.  SOCIAL HISTORY:  He is married, lives with his wife.  FAMILY HISTORY:  Unchanged.  PHYSICAL EXAMINATION:  VITAL SIGNS:  Blood pressure is 103/59, pulse 50, respirations 18, and O2 sats 98 on room air. CONSTITUTIONAL:  He is within normal limits.  He is alert and oriented x3 and appears somewhat depressed. NEUROLOGIC:  Gait is normal.  Sensation is somewhat diminished in the soles and arches of his feet, otherwise intact.  Motor strength is good in lower extremities.  ASSESSMENT: 1. Post-laminectomy syndrome. 2. Chronic pain. 3. 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  PLAN: 1. He will continue his current medication regimen as scheduled. 2. We will refill his oxycodone 7.5/325 one p.o. t.i.d. 90 with no     refill. 3. Opana ER 20 mg 1 p.o. b.i.d., 60 with no refill. 4. He will follow up here in the clinic in 1 month with Dr. Wynn Banker.     His questions were encouraged and answered.     Augustine Leverette L. Blima Dessert Electronically Signed    RLW/MedQ D:  11/22/2010 09:42:02  T:  11/23/2010 01:47:06  Job #:  [id number]

## 2010-12-21 ENCOUNTER — Ambulatory Visit (HOSPITAL_BASED_OUTPATIENT_CLINIC_OR_DEPARTMENT_OTHER): Payer: Medicaid Other | Admitting: Physical Medicine & Rehabilitation

## 2010-12-21 ENCOUNTER — Encounter: Payer: Medicaid Other | Attending: Neurosurgery

## 2010-12-21 DIAGNOSIS — M961 Postlaminectomy syndrome, not elsewhere classified: Secondary | ICD-10-CM

## 2010-12-21 DIAGNOSIS — G5790 Unspecified mononeuropathy of unspecified lower limb: Secondary | ICD-10-CM | POA: Insufficient documentation

## 2010-12-21 DIAGNOSIS — M545 Low back pain, unspecified: Secondary | ICD-10-CM | POA: Insufficient documentation

## 2010-12-21 DIAGNOSIS — M4802 Spinal stenosis, cervical region: Secondary | ICD-10-CM

## 2010-12-21 DIAGNOSIS — M79609 Pain in unspecified limb: Secondary | ICD-10-CM | POA: Insufficient documentation

## 2010-12-21 DIAGNOSIS — E119 Type 2 diabetes mellitus without complications: Secondary | ICD-10-CM | POA: Insufficient documentation

## 2010-12-21 DIAGNOSIS — G8929 Other chronic pain: Secondary | ICD-10-CM | POA: Insufficient documentation

## 2010-12-21 NOTE — Assessment & Plan Note (Signed)
CHIEF COMPLAINT:  Bilateral foot pain.  A 60 year old male with history of lumbar post laminectomy syndrome.  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 Lovell Sheehan, back a couple of months ago who recommended surgery.  Once again, the patient did not want to undergo surgery.  He does have diabetic foot pain as well.  He has numbness and pain in his feet which is worse in the morning.  Pain is 8-9, this is mainly in his feet.  He does have also some pain in his back, but overall pretty good results with his Opana pain and oxycodone.  He needs assist with dressing, bathing, meal prep, household duties, shopping.  REVIEW OF SYSTEMS:  Positive for weakness, numbness, tingling, trouble walking, spasms, depression, confusion, anxiety, poor appetite, constipation.  PHYSICAL EXAMINATION:  VITAL SIGNS:  Blood pressure 129/80, pulse 73, respirations 18, O2 sat 100% on room air. GENERAL:  No acute distress.  Mood and affect appropriate. MUSCULOSKELETAL:  His back has mild tenderness to palpation in lumbosacral junction.  His feet have decreased sensation in toes compared to the knees, but he is able to identify sharp touch in bilateral lower extremities with his eyes closed.  Deep tendon reflex reduced at the ankles, normal at the left knee, and reduced at the right knee.  Lower extremity strength is normal.  IMPRESSION: 1. Lumbar post laminectomy syndrome. 2. 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  PLAN: 1. Continue oxycodone 7.5/325 t.i.d. 2. Opana ER 1 p.o. b.i.d. 3. Increase Lyrica to 50 t.i.d.  Discussed with patient, agrees with plan, [facility]-Level in 1 month.  I will see him back in 5 months.     [name], M.D. Electronically Signed    AEK/MedQ D:  12/21/2010 13:59:12  T:  12/21/2010 21:53:59  Job #:  [id number]

## 2011-01-18 ENCOUNTER — Encounter: Payer: Medicaid Other | Attending: Neurosurgery | Admitting: Neurosurgery

## 2011-01-18 DIAGNOSIS — G894 Chronic pain syndrome: Secondary | ICD-10-CM

## 2011-01-18 DIAGNOSIS — M961 Postlaminectomy syndrome, not elsewhere classified: Secondary | ICD-10-CM

## 2011-01-18 DIAGNOSIS — M79609 Pain in unspecified limb: Secondary | ICD-10-CM | POA: Insufficient documentation

## 2011-01-18 DIAGNOSIS — E119 Type 2 diabetes mellitus without complications: Secondary | ICD-10-CM | POA: Insufficient documentation

## 2011-01-18 DIAGNOSIS — G5790 Unspecified mononeuropathy of unspecified lower limb: Secondary | ICD-10-CM | POA: Insufficient documentation

## 2011-01-18 DIAGNOSIS — M545 Low back pain, unspecified: Secondary | ICD-10-CM | POA: Insufficient documentation

## 2011-01-18 DIAGNOSIS — G8929 Other chronic pain: Secondary | ICD-10-CM | POA: Insufficient documentation

## 2011-01-19 NOTE — Assessment & Plan Note (Signed)
Patient of Dr. Wynn Banker, seen for post laminectomy syndrome.  He has been operated by Dr. Lovell Sheehan at [facility] who recommended to repeat surgery with the patient tells me today that as he told Dr. Wynn Banker, last month, he does not want any further surgery.  He also has diabetic foot pain neuropathy which seems to be worse.  He complaints today that his back is worse and his radicular symptoms worse bilaterally, but again he will not have surgery and rates his average pain at  9.  It is a sharp pain that is stabbing with paresthesias. General activity level is 1-3.  Pain is worse in the morning, it is worse with walking, bending, sitting, inactivity, and standing.  The patient uses rest, ice, medication for relief.  He walks without assistance.  He can only walk about 3 minutes at a time.  He does not climb steps or drive.  He is unemployed.  REVIEW OF SYSTEMS:  Notable for those difficulties as described above as well as some weakness, dizziness, spasms, confusion, depression, anxiety, loss of taste, smell, fever, chills, night sweats, high and low blood sugar fluctuations, constipation, poor appetite, limb swelling, sleep apnea, and wheezing.  He has no suicidal thoughts or aberrant behaviors.  His Oswestry score is 74.  PAST MEDICAL HISTORY:  Unchanged.  SOCIAL HISTORY:  Married, lives with his wife.  FAMILY HISTORY:  Unchanged.  PHYSICAL EXAMINATION:  VITAL SIGNS:  His blood pressure is 99/67, pulse 60, respirations 18, O2 sats 100 on room air. MUSCULOSKELETAL:  His iliopsoas strength is 5/5 bilaterally with quads. Sensation is intact. GENERAL:  He is constitutionally within normal limits.  He is alert and oriented x3.  Gait normal.  IMPRESSION: 1. Post laminectomy syndrome. 2. Lower extremity neuropathy, diabetic.  PLAN:  Refill oxycodone 7.5/325 one p.o. t.i.d. 90 with no refill, Opana ER 20 mg one p.o. b.i.d. 60 with no refill.  He will follow up in  the clinic in 1 month.     Seneca Gadbois L. Blima Dessert Electronically Signed    RLW/MedQ D:  01/18/2011 14:15:48  T:  01/19/2011 00:05:03  Job #:  [id number]

## 2011-02-15 ENCOUNTER — Encounter: Payer: Medicaid Other | Attending: Neurosurgery | Admitting: Neurosurgery

## 2011-02-15 DIAGNOSIS — M79609 Pain in unspecified limb: Secondary | ICD-10-CM | POA: Insufficient documentation

## 2011-02-15 DIAGNOSIS — G609 Hereditary and idiopathic neuropathy, unspecified: Secondary | ICD-10-CM

## 2011-02-15 DIAGNOSIS — M542 Cervicalgia: Secondary | ICD-10-CM | POA: Insufficient documentation

## 2011-02-15 DIAGNOSIS — E1149 Type 2 diabetes mellitus with other diabetic neurological complication: Secondary | ICD-10-CM | POA: Insufficient documentation

## 2011-02-15 DIAGNOSIS — E1142 Type 2 diabetes mellitus with diabetic polyneuropathy: Secondary | ICD-10-CM | POA: Insufficient documentation

## 2011-02-15 DIAGNOSIS — Z79899 Other long term (current) drug therapy: Secondary | ICD-10-CM | POA: Insufficient documentation

## 2011-02-15 DIAGNOSIS — M961 Postlaminectomy syndrome, not elsewhere classified: Secondary | ICD-10-CM | POA: Insufficient documentation

## 2011-02-15 DIAGNOSIS — G8929 Other chronic pain: Secondary | ICD-10-CM | POA: Insufficient documentation

## 2011-02-15 DIAGNOSIS — M25559 Pain in unspecified hip: Secondary | ICD-10-CM | POA: Insufficient documentation

## 2011-02-17 NOTE — Assessment & Plan Note (Signed)
Account [id number].  This is a patient who is followed by Dr. Wynn Banker for post laminectomy syndrome.  The patient does have some occasional neck pain, greater trochanter, sciatic pain as well as bilateral lower extremity pain.  He has peripheral neuropathy that is related to his diabetes.  He does state he feels like his feet are swelling from the Lyrica.  I asked him how he determined Lyrica and he states that he feels like none of the other medicines have ever done that even though he has been on the Lyrica for sometime.  He is on 50 mg t.i.d.  He rates his average pain at 8-9.  It is a sharp burning, stabbing, tingling pain.  General activity level is 4-10.  The pain is worse in the morning.  Sleep patterns are poor.  All activities aggravate.  Rest, ice, medication, tend to help.  He walks without assistance.  He can only walk about 5 minutes at a time.  He does not drive or climb steps.  He is not employed.  REVIEW OF SYSTEMS:  Notable for the difficulties described above as well as some weakness, paresthesias, spasms, dizziness, trouble walking, confusion, anxiety, loss of taste or smell, night sweats, high and low blood sugar fluctuations, nausea, constipation, abdominal pain, poor appetite, respiratory infections, limb swelling, shortness of breath. Sleep apnea and wheezing.  No suicidal thoughts or aberrant behaviors. Oswestry score is 74.  PAST MEDICAL HISTORY:  Unchanged.  SOCIAL HISTORY:  He is married, lives with his wife.  FAMILY HISTORY:  Unchanged.  PHYSICAL EXAMINATION:  VITAL SIGNS:  His blood pressure is 144/70, pulse 74, respirations 18, O2 sats 100 on room air.  His motor strength 5/5 in the lower extremities to confrontational testing.  His sensation is diminished in the lower extremities. Constitutionally, he is within normal limits.  He is alert and oriented x3.  ASSESSMENT: 1. Post laminectomy syndrome. 2. Chronic pain. 3. Lower extremity neuropathy due  to his diabetes.  PLAN: 1. Refill his Opana ER 20 mg one p.o. b.i.d. #60 with no refill. 2. Oxycodone 7.5/325 one p.o. t.i.d., #90 with no refill. 3. He has refills on his Lyrica.  We talked about going down to b.i.d.     He states he really does not want to do that because he thinks the     t.i.d. dosing helps, but if he continues to swell he may drop it     down to b.i.d. and see if that helps.  Otherwise, his questions     were encouraged and answered.  I will see him back here in 1 month.     Adell Panek L. Blima Dessert Electronically Signed    RLW/MedQ D:  02/16/2011 15:06:29  T:  02/17/2011 00:35:37  Job #:  [id number]

## 2011-03-23 ENCOUNTER — Ambulatory Visit (HOSPITAL_BASED_OUTPATIENT_CLINIC_OR_DEPARTMENT_OTHER): Payer: Medicaid Other | Admitting: Physical Medicine & Rehabilitation

## 2011-03-23 ENCOUNTER — Encounter: Payer: Medicaid Other | Attending: Neurosurgery

## 2011-03-23 DIAGNOSIS — E119 Type 2 diabetes mellitus without complications: Secondary | ICD-10-CM | POA: Insufficient documentation

## 2011-03-23 DIAGNOSIS — M961 Postlaminectomy syndrome, not elsewhere classified: Secondary | ICD-10-CM | POA: Insufficient documentation

## 2011-03-23 DIAGNOSIS — M79609 Pain in unspecified limb: Secondary | ICD-10-CM | POA: Insufficient documentation

## 2011-03-23 DIAGNOSIS — R209 Unspecified disturbances of skin sensation: Secondary | ICD-10-CM

## 2011-03-23 DIAGNOSIS — M545 Low back pain, unspecified: Secondary | ICD-10-CM | POA: Insufficient documentation

## 2011-03-23 DIAGNOSIS — G8929 Other chronic pain: Secondary | ICD-10-CM | POA: Insufficient documentation

## 2011-03-23 DIAGNOSIS — G5790 Unspecified mononeuropathy of unspecified lower limb: Secondary | ICD-10-CM | POA: Insufficient documentation

## 2011-03-23 DIAGNOSIS — M25549 Pain in joints of unspecified hand: Secondary | ICD-10-CM

## 2011-03-23 NOTE — Assessment & Plan Note (Signed)
HISTORY:  A 60 year old male who has had a history of chronic post laminectomy syndrome causing low back pain radiating into the lower extremities.  In addition, he has diabetic neuropathy and has had chronically numb feet.  This was diagnosed by EMG.  More recently, he has been complaining of right hand pain.  We trialed him on some Voltaren gel, but this unfortunately has not been very helpful for him.  He plans to follow up with Dr. Lovell Sheehan in regards to his low back.  His pain is described as sharp, dull, stabbing, burning, tingling and aching 8-9/10.  Sleep is poor.  Pain is worse with walking, bending, sitting, inactivity and standing; improves with rest, ice and medication. Relief from meds is poor.  He can walk 1-3 minutes at a time.  He does not drive or climb steps.  His walking tolerance is 1-3 minutes.  He can feed himself, toilet himself, has some difficulty with certain dressing, bathing, meal prep, household duties and shopping. His household duties are mainly like cooking.  His last Oswestry score was 74%.  REVIEW OF SYSTEMS:  Basically positive on the health and history form. Please refer to form.  Pertinent negatives include negative for bowel or bladder dysfunction other than some constipation.  PAST HISTORY:  Significant for hypertension, arthritis and diabetes.  SOCIAL HISTORY:  Married, lives with his wife.  Nonsmoker, nondrinker.  FAMILY HISTORY:  Diabetes and hypertension.  PHYSICAL EXAMINATION:  VITAL SIGNS:  Blood pressure 136/85, pulse 88, respiratory rate 16 and O2 sat 100% on room air.  GENERAL:  No acute distress.  Mood and affect appropriate. BACK:  His back has tenderness around L3-4 area as well as the right PSIS area.  Some residual tenderness around the surgical site L4-5, L5- S1 as well. EXTREMITIES:  His lower extremity strength is normal.  Lower extremity range of motion is normal.  He has decreased right L3 sensory as well as right S1  sensory and bilateral L4-5 decreased sensory. Gait shows no evidence of toe drag or knee instability.  IMPRESSION: 1. Lumbar postlaminectomy syndrome, status post fusion L4-5, L5-S1.     He does have some adjacent level degeneration L3-4. 2. Right hip and buttock pain.  He has some exam findings consistent     with sacroiliac disorder and certainly this can happen after     fusion.  We discussed injection.  He would like to think about this 3. Hand pain.  Examination today shows no evidence of wasting.  He has     good strength.  No clear-cut sensory     deficits.  However, symptoms do suggest carpal tunnel versus some     extension of his diabetic neuropathy to his hands.  We will check     EMG of the right upper.  Discussed with the patient, agrees with     plan.     [name], M.D. Electronically Signed    AEK/MedQ D:  03/23/2011 13:53:15  T:  03/23/2011 14:17:29  Job #:  [id number]  cc:   Tanya D. Daphine Deutscher, M.D. Fax: [phone]  [name], M.D. Fax: [phone]

## 2011-04-24 ENCOUNTER — Ambulatory Visit (HOSPITAL_BASED_OUTPATIENT_CLINIC_OR_DEPARTMENT_OTHER): Payer: Medicaid Other | Admitting: Physical Medicine & Rehabilitation

## 2011-04-24 ENCOUNTER — Encounter: Payer: Medicaid Other | Attending: Neurosurgery

## 2011-04-24 DIAGNOSIS — G609 Hereditary and idiopathic neuropathy, unspecified: Secondary | ICD-10-CM

## 2011-04-24 DIAGNOSIS — M79609 Pain in unspecified limb: Secondary | ICD-10-CM | POA: Insufficient documentation

## 2011-04-24 DIAGNOSIS — G8929 Other chronic pain: Secondary | ICD-10-CM | POA: Insufficient documentation

## 2011-04-24 DIAGNOSIS — M545 Low back pain, unspecified: Secondary | ICD-10-CM | POA: Insufficient documentation

## 2011-04-24 DIAGNOSIS — G5790 Unspecified mononeuropathy of unspecified lower limb: Secondary | ICD-10-CM | POA: Insufficient documentation

## 2011-04-24 DIAGNOSIS — M25549 Pain in joints of unspecified hand: Secondary | ICD-10-CM

## 2011-04-24 DIAGNOSIS — E119 Type 2 diabetes mellitus without complications: Secondary | ICD-10-CM | POA: Insufficient documentation

## 2011-04-24 DIAGNOSIS — M961 Postlaminectomy syndrome, not elsewhere classified: Secondary | ICD-10-CM | POA: Insufficient documentation

## 2011-05-22 ENCOUNTER — Ambulatory Visit (HOSPITAL_BASED_OUTPATIENT_CLINIC_OR_DEPARTMENT_OTHER): Payer: Medicaid Other | Admitting: Physical Medicine & Rehabilitation

## 2011-05-22 ENCOUNTER — Encounter: Payer: Medicaid Other | Attending: Neurosurgery

## 2011-05-22 DIAGNOSIS — M545 Low back pain, unspecified: Secondary | ICD-10-CM | POA: Insufficient documentation

## 2011-05-22 DIAGNOSIS — E119 Type 2 diabetes mellitus without complications: Secondary | ICD-10-CM | POA: Insufficient documentation

## 2011-05-22 DIAGNOSIS — G8929 Other chronic pain: Secondary | ICD-10-CM | POA: Insufficient documentation

## 2011-05-22 DIAGNOSIS — M961 Postlaminectomy syndrome, not elsewhere classified: Secondary | ICD-10-CM | POA: Insufficient documentation

## 2011-05-22 DIAGNOSIS — G5790 Unspecified mononeuropathy of unspecified lower limb: Secondary | ICD-10-CM | POA: Insufficient documentation

## 2011-05-22 DIAGNOSIS — M79609 Pain in unspecified limb: Secondary | ICD-10-CM | POA: Insufficient documentation

## 2011-05-22 NOTE — Procedures (Signed)
NAME:  Anthony Moran, Anthony Moran             ACCOUNT NO.:  [id number]  MEDICAL RECORD NO.:  [id number]           PATIENT TYPE:  O  LOCATION:  [facility]                          FACILITY:  [facility]  PHYSICIAN:  [name], M.D.DATE OF BIRTH:  [birth year]  DATE OF PROCEDURE:  05/22/2011 DATE OF DISCHARGE:                              OPERATIVE REPORT  This is a first carpometacarpal ultrasound-guided injection.  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  Pain is only partially responsive to medication management including narcotic analgesics.  Did not respond to Voltaren gel.  Area was pre-scanned with 12 Hz transducer, joint irregularity noted, final palpation positive over the carpometacarpal joint.  Area was marked and prepped with Betadine under sterile conditions draped using a sterile probe cover, sterile technique, 27-gauge 5 ACE inch needle was introduced to numb the skin and subcu tissue 1 mL of 1% lidocaine, followed by insertion of a 40-mm, 22-gauge echo block needle inserted under direct ultrasound visualization into the right carpometacarpal joint, capsule penetrated with 1 mL of 1% lidocaine plus 1.5 mL of 40 mg/mL Depo-Medrol injected.  The patient tolerated procedure well.  Postprocedure instructions given.  Prescriptions for the month written. He will see me in 1 month.     [name], M.D. Electronically Signed    AEK/MEDQ  D:  05/22/2011 14:19:55  T:  05/22/2011 22:26:00  Job:  [id number]

## 2011-05-22 NOTE — Assessment & Plan Note (Signed)
This is a limit ultrasound of the wrist to evaluate carpal tunnel-type symptoms.  The patient states that he continues to have some pain radiating into his first his thumb and index finger of the right hand. He did have EMG which showed slowing of ulnar sensory conduction as well as mild slowing of the both ulnar and median forearm segments.  It was borderline at the median sensory now.  He does have diabetes.  Diagnostic ultrasound performed to evaluate for carpal tunnel syndrome.  The patient placed in a seated position, 12 Hz transducer utilized.  His cross-sectional diameter of the median nerve was measured at 0.17-cm squared at the distal wrist crease, 10-cm proximal to this in the distal forearm area was 0.14 cm squared.  The ratio was 1.2.  IMPRESSION:  No ultrasonic evidence of median neuropathy at the wrist.     [name], M.D. Electronically Signed    AEK/MedQ D:  05/22/2011 15:14:34  T:  05/22/2011 23:01:27  Job #:  [id number]

## 2011-05-30 ENCOUNTER — Encounter: Payer: Self-pay | Admitting: Gastroenterology

## 2011-06-12 DIAGNOSIS — K602 Anal fissure, unspecified: Secondary | ICD-10-CM

## 2011-06-12 HISTORY — DX: Anal fissure, unspecified: K60.2

## 2011-06-22 ENCOUNTER — Encounter: Payer: Self-pay | Admitting: Gastroenterology

## 2011-06-22 ENCOUNTER — Other Ambulatory Visit (INDEPENDENT_AMBULATORY_CARE_PROVIDER_SITE_OTHER): Payer: Medicaid Other

## 2011-06-22 ENCOUNTER — Ambulatory Visit (INDEPENDENT_AMBULATORY_CARE_PROVIDER_SITE_OTHER): Payer: Medicaid Other | Admitting: Gastroenterology

## 2011-06-22 VITALS — BP 112/78 | HR 69 | Ht 70.0 in | Wt 244.8 lb

## 2011-06-22 DIAGNOSIS — E1159 Type 2 diabetes mellitus with other circulatory complications: Secondary | ICD-10-CM | POA: Insufficient documentation

## 2011-06-22 DIAGNOSIS — K5900 Constipation, unspecified: Secondary | ICD-10-CM

## 2011-06-22 DIAGNOSIS — K602 Anal fissure, unspecified: Secondary | ICD-10-CM

## 2011-06-22 DIAGNOSIS — I10 Essential (primary) hypertension: Secondary | ICD-10-CM

## 2011-06-22 LAB — CBC WITH DIFFERENTIAL/PLATELET
Basophils Absolute: 0 10*3/uL (ref 0.0–0.1)
Basophils Relative: 0.5 % (ref 0.0–3.0)
Eosinophils Absolute: 0.1 10*3/uL (ref 0.0–0.7)
HCT: 26.6 % — ABNORMAL LOW (ref 39.0–52.0)
Lymphs Abs: 1.8 10*3/uL (ref 0.7–4.0)
MCHC: 33.4 g/dL (ref 30.0–36.0)
Monocytes Relative: 6.4 % (ref 3.0–12.0)
Neutro Abs: 2.6 10*3/uL (ref 1.4–7.7)
Neutrophils Relative %: 52.6 % (ref 43.0–77.0)
Platelets: 171 10*3/uL (ref 150.0–400.0)
RBC: 3.34 Mil/uL — ABNORMAL LOW (ref 4.22–5.81)
WBC: 4.9 10*3/uL (ref 4.5–10.5)

## 2011-06-22 MED ORDER — PEG-KCL-NACL-NASULF-NA ASC-C 100 G PO SOLR: 1.0000 | ORAL | Status: DC

## 2011-06-22 NOTE — Patient Instructions (Signed)
Please start taking citrucel (orange flavored) powder fiber supplement.  This may cause some bloating at first but that usually goes away. Begin with a small spoonful and work your way up to a large, heaping spoonful daily over a week. You will be set up for a colonoscopy. You will have labs checked today in the basement lab.  Please head down after you check out with the front desk  ( [facility])

## 2011-06-22 NOTE — Progress Notes (Signed)
HPI: This is a   very pleasant 61 year old man whom I am meeting for the first time today.  He had a colonoscopy more than 10 years ago.  Polyps were found.h .   He has noticed rectal bleeding after new consitpation and a lot of straining (began gabapentin).  Has been going on for 6 months at least.  Can even drip into the toilette.   Anus can get irritated, uses prep h occastionally.  Feels a tear at anus sometimes.    Overall weight gain lately  Takes miralax about once daily usuasly, this helps his bowels move easier pretty well.    Review of systems: Pertinent positive and negative review of systems were noted in the above HPI section. Complete review of systems was performed and was otherwise normal.    Past Medical History  Diagnosis Date  . Hypertension   . Diabetes mellitus   . Anxiety   . Depression   . Arthritis   . Asthma     Past Surgical History  Procedure Date  . Lumbar fusion     L 4-5, L5 si    Current Outpatient Prescriptions  Medication Sig Dispense Refill  . amLODipine (NORVASC) 10 MG tablet Take 10 mg by mouth daily.        . cetirizine (ZYRTEC) 10 MG tablet Take 10 mg by mouth daily.        . cloNIDine (CATAPRES - DOSED IN MG/24 HR) 0.2 mg/24hr patch Place 1 patch onto the skin once a week.        . losartan-hydrochlorothiazide (HYZAAR) 100-25 MG per tablet Take 1 tablet by mouth daily.        . metFORMIN (GLUCOPHAGE) 500 MG tablet Take 500 mg by mouth daily with breakfast.        . oxyCODONE-acetaminophen (PERCOCET) 7.5-325 MG per tablet Take 1 tablet by mouth every 6 (six) hours as needed.        . Pseudoeph-Chlorphen-Hydrocod (ZUTRIPRO) 60-4-5 MG/5ML SOLN Take 5 mLs by mouth every 6 (six) hours.          Allergies as of 06/22/2011  . (Not on File)    Family History  Problem Relation Age of Onset  . Breast cancer Maternal Aunt   . Prostate cancer Father   . Hypertension Sister     History   Social History  . Marital Status: Married   Spouse Name: N/A    Number of Children: 3  . Years of Education: N/A   Occupational History  .     Social History Main Topics  . Smoking status: Never Smoker   . Smokeless tobacco: Never Used  . Alcohol Use: No     rare  . Drug Use: No  . Sexually Active: Not on file   Other Topics Concern  . Not on file   Social History Narrative  . No narrative on file       Physical Exam: BP 112/78  Pulse 69  Ht 5\' 10"  (1.778 m)  Wt 244 lb 12.8 oz (111.041 kg)  BMI 35.13 kg/m2  SpO2 97% Constitutional: generally well-appearing Psychiatric: alert and oriented x3 Eyes: extraocular movements intact Mouth: oral pharynx moist, no lesions Neck: supple no lymphadenopathy Cardiovascular: heart regular rate and rhythm Lungs: clear to auscultation bilaterally Abdomen: soft, nontender, nondistended, no obvious ascites, no peritoneal signs, normal bowel sounds Extremities: no lower extremity edema bilaterally Skin: no lesions on visible extremities Rectal examination found shallow, midline posterior anal fissure otherwise was  normal   Assessment and plan: 61 y.o. male with  typical Anal fissure, intermittent constipation  He will add fiber supplements to his daily MiraLax to see if this will help him keep from straining, help resolve the anal fissure. CBC today to see if he is anemic. Colonoscopy in his soonest convenience. He has not had one in at least 10 years he tells.

## 2011-06-25 ENCOUNTER — Telehealth: Payer: Self-pay

## 2011-06-25 DIAGNOSIS — D649 Anemia, unspecified: Secondary | ICD-10-CM

## 2011-06-25 NOTE — Telephone Encounter (Signed)
[phone]  Pt aware and will have labs done this week  Waiting on records from Dr Daphine Deutscher regarding labs

## 2011-06-26 ENCOUNTER — Ambulatory Visit (HOSPITAL_BASED_OUTPATIENT_CLINIC_OR_DEPARTMENT_OTHER): Payer: Medicaid Other | Admitting: Physical Medicine & Rehabilitation

## 2011-06-26 ENCOUNTER — Other Ambulatory Visit (INDEPENDENT_AMBULATORY_CARE_PROVIDER_SITE_OTHER): Payer: Medicaid Other

## 2011-06-26 ENCOUNTER — Encounter: Payer: Medicaid Other | Attending: Neurosurgery

## 2011-06-26 DIAGNOSIS — D649 Anemia, unspecified: Secondary | ICD-10-CM

## 2011-06-26 DIAGNOSIS — M79609 Pain in unspecified limb: Secondary | ICD-10-CM | POA: Insufficient documentation

## 2011-06-26 DIAGNOSIS — M961 Postlaminectomy syndrome, not elsewhere classified: Secondary | ICD-10-CM | POA: Insufficient documentation

## 2011-06-26 DIAGNOSIS — E119 Type 2 diabetes mellitus without complications: Secondary | ICD-10-CM | POA: Insufficient documentation

## 2011-06-26 DIAGNOSIS — E1142 Type 2 diabetes mellitus with diabetic polyneuropathy: Secondary | ICD-10-CM

## 2011-06-26 DIAGNOSIS — G8929 Other chronic pain: Secondary | ICD-10-CM | POA: Insufficient documentation

## 2011-06-26 DIAGNOSIS — M545 Low back pain, unspecified: Secondary | ICD-10-CM | POA: Insufficient documentation

## 2011-06-26 DIAGNOSIS — G5790 Unspecified mononeuropathy of unspecified lower limb: Secondary | ICD-10-CM | POA: Insufficient documentation

## 2011-06-26 LAB — IBC PANEL
Iron: 50 ug/dL (ref 42–165)
Saturation Ratios: 16.5 % — ABNORMAL LOW (ref 20.0–50.0)

## 2011-06-26 LAB — VITAMIN B12: Vitamin B-12: 627 pg/mL (ref 211–911)

## 2011-06-26 LAB — FOLATE: Folate: 12.7 ng/mL (ref >5.9)

## 2011-06-26 NOTE — Assessment & Plan Note (Signed)
HISTORY:  A 61 year old male with lumbar post laminectomy syndrome, chronic lumbar radiculitis.  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  He has a history of cervical stenosis.  CURRENT MEDICATIONS: 1. Voltaren gel to his right hand q.i.d. p.r.n. 2. Q.i.d. Cymbalta 60 mg per day. 3. Lyrica 50 t.i.d. was b.i.d. before in October. 4. Oxycodone 7.5/325 t.i.d. 5. Opana ER 20 mg b.i.d.  Pain level 8-9/10.  He is stable compared to prior.  He has pain both in his back and in his legs.  His feet feel numb, but are not particularly painful.  Pain is worse with walking, bending, sitting, inactivity, standing; improves with rest and medication.  He can walk for 3 minutes at a time.  He does not climb steps.  He does not drive.  He needs help with dressing, bathing, meal prep, household duties and shopping.  He can toilet himself and feed himself.  With this numbness, tremor, tingling, trouble walking, spasms, dizziness, confusion, depression, anxiety, constipation, appetite, poor abdominal pain, limb swelling, shortness of breath, sleep apnea and wheezing are all positive.  PRIMARY CARE PHYSICIAN:  Cephas Darby. Daphine Deutscher, M.D. who follows him medically and his neurosurgeon, [name], M.D.  PAST MEDICAL HISTORY:  Diabetes, hypertension and arthritis.  SOCIAL HISTORY:  Married, lives with his wife.  FAMILY HISTORY:  Diabetes and hypertension.  PHYSICAL EXAMINATION:  VITAL SIGNS:  Weight 240 pounds, height 5 feet 10 inches, blood pressure 133/75, pulse 76, respiratory rate is 18 and O2 sat 94% on room air. GENERAL:  No acute distress.  Mood and affect appropriate. EXTREMITIES:  His lower extremity strength is normal.  His sensation is reduced in his feet to pinprick.  Deep tendon reflex 1+ bilateral knees and ankles.  Gait slightly widened base support.  Small step length. Slow gait pattern.  Forward flexed at hips using walker.  IMPRESSION: 1. Lumbar post laminectomy  syndrome.  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  He is status post L4-5, L5-S1 fusion. 2. Diabetes with neuropathy.  Continue Cymbalta.  We increased his     Lyrica.  This resulted in some twitching as well as some increased     swelling in his feet only trace pedal.  We will reduce back to 50     b.i.d.  Discussed with the patient, agrees with plan.  Nursing     visit in 1 month, and I will see him back in 2 months.     [name], M.D. Electronically Signed    AEK/MedQ D:  06/26/2011 16:21:58  T:  06/26/2011 21:42:40  Job #:  [id number]

## 2011-07-03 ENCOUNTER — Encounter: Payer: Self-pay | Admitting: Gastroenterology

## 2011-07-03 ENCOUNTER — Ambulatory Visit (AMBULATORY_SURGERY_CENTER): Payer: Medicaid Other | Admitting: Gastroenterology

## 2011-07-03 DIAGNOSIS — K5730 Diverticulosis of large intestine without perforation or abscess without bleeding: Secondary | ICD-10-CM

## 2011-07-03 DIAGNOSIS — K602 Anal fissure, unspecified: Secondary | ICD-10-CM

## 2011-07-03 DIAGNOSIS — D126 Benign neoplasm of colon, unspecified: Secondary | ICD-10-CM

## 2011-07-03 DIAGNOSIS — K5900 Constipation, unspecified: Secondary | ICD-10-CM

## 2011-07-03 LAB — GLUCOSE, CAPILLARY

## 2011-07-03 MED ORDER — SODIUM CHLORIDE 0.9 % IV SOLN: 500.0000 mL | INTRAVENOUS | Status: DC

## 2011-07-03 NOTE — Patient Instructions (Signed)
Discharge instructions given with verbal understanding.  Handouts on polyps and diverticulosis given.  Resume previous medications. 

## 2011-07-03 NOTE — Op Note (Signed)
[facility] [address] [place], [place]  [address]  COLONOSCOPY PROCEDURE REPORT  PATIENT:  Anthony, Moran  MR#:  [id number] BIRTHDATE:  May 26, 1951, 60 yrs. old  GENDER:  male ENDOSCOPIST:  Rachael Fee, MD REF. BY:  Alwyn Pea, M.D. PROCEDURE DATE:  07/03/2011 PROCEDURE:  Colonoscopy with snare polypectomy ASA CLASS:  Class II INDICATIONS:  minor rectal bleeding, known anal fissure MEDICATIONS:   Fentanyl 75 mcg IV, These medications were titrated to patient response per physician's verbal order, Versed 7 mg IV DESCRIPTION OF PROCEDURE:   After the risks benefits and alternatives of the procedure were thoroughly explained, informed consent was obtained.  Digital rectal exam was performed and revealed no rectal masses.   The LB 180AL [id number] endoscope was introduced through the anus and advanced to the cecum, which was identified by both the appendix and ileocecal valve, without limitations.  The quality of the prep was good..  The instrument was then slowly withdrawn as the colon was fully examined. <<PROCEDUREIMAGES>> FINDINGS:  Mild diverticulosis was found in the sigmoid to descending colon segments (see image1).  A diminutive polyp was found in the sigmoid colon. This was removed with cold snare and sent to pathology (jar 1) (see image5).  This was otherwise a normal examination of the colon (see image7, image4, and image3). Retroflexed views in the rectum revealed no abnormalities. COMPLICATIONS:  None  ENDOSCOPIC IMPRESSION: 1) Mild diverticulosis in the sigmoid to descending colon segments 2) Diminutive polyp in the sigmoid colon, this was removed and sent to pathology 3) Otherwise normal examination  RECOMMENDATIONS: 1) If the polyp(s) removed today are proven to be adenomatous (pre-cancerous) polyps, you will need a repeat colonoscopy in 5 years. Otherwise you should continue to follow colorectal cancer screening guidelines for "routine risk"  patients with colonoscopy in 10 years. 2) You will receive a letter within 1-2 weeks with the results of your biopsy as well as final recommendations. Please call my office if you have not received a letter after 3 weeks. 3) Please resume daily fiber and miralax (both, every day) for your constipation. This should help your anal fissure heal.  ______________________________ Rachael Fee, MD  n. eSIGNED:   Rachael Fee at 07/03/2011 10:52 AM  Latina Craver, [phone]

## 2011-07-03 NOTE — Progress Notes (Signed)
Patient did not experience any of the following events: a burn prior to discharge; a fall within the facility; wrong site/side/patient/procedure/implant event; or a hospital transfer or hospital admission upon discharge from the facility. (G8907) Patient did not have preoperative order for IV antibiotic SSI prophylaxis. (G8918)  

## 2011-07-04 ENCOUNTER — Telehealth: Payer: Self-pay | Admitting: *Deleted

## 2011-07-04 NOTE — Telephone Encounter (Signed)

## 2011-07-10 ENCOUNTER — Encounter: Payer: Self-pay | Admitting: Gastroenterology

## 2011-07-26 ENCOUNTER — Ambulatory Visit: Payer: Medicaid Other

## 2011-07-30 ENCOUNTER — Encounter: Payer: Medicaid Other | Attending: Neurosurgery

## 2011-07-30 DIAGNOSIS — IMO0002 Reserved for concepts with insufficient information to code with codable children: Secondary | ICD-10-CM | POA: Insufficient documentation

## 2011-07-30 DIAGNOSIS — E1149 Type 2 diabetes mellitus with other diabetic neurological complication: Secondary | ICD-10-CM | POA: Insufficient documentation

## 2011-07-30 DIAGNOSIS — G587 Mononeuritis multiplex: Secondary | ICD-10-CM

## 2011-07-30 DIAGNOSIS — E1142 Type 2 diabetes mellitus with diabetic polyneuropathy: Secondary | ICD-10-CM | POA: Insufficient documentation

## 2011-07-30 DIAGNOSIS — I10 Essential (primary) hypertension: Secondary | ICD-10-CM | POA: Insufficient documentation

## 2011-07-30 DIAGNOSIS — Z79899 Other long term (current) drug therapy: Secondary | ICD-10-CM | POA: Insufficient documentation

## 2011-07-30 DIAGNOSIS — M4802 Spinal stenosis, cervical region: Secondary | ICD-10-CM | POA: Insufficient documentation

## 2011-07-30 DIAGNOSIS — M47812 Spondylosis without myelopathy or radiculopathy, cervical region: Secondary | ICD-10-CM

## 2011-07-30 DIAGNOSIS — M129 Arthropathy, unspecified: Secondary | ICD-10-CM | POA: Insufficient documentation

## 2011-07-30 DIAGNOSIS — M961 Postlaminectomy syndrome, not elsewhere classified: Secondary | ICD-10-CM

## 2011-08-27 ENCOUNTER — Encounter: Payer: Medicaid Other | Attending: Physical Medicine & Rehabilitation | Admitting: *Deleted

## 2011-08-27 ENCOUNTER — Other Ambulatory Visit: Payer: Self-pay | Admitting: *Deleted

## 2011-08-27 ENCOUNTER — Encounter: Payer: Self-pay | Admitting: *Deleted

## 2011-08-27 VITALS — BP 143/79 | HR 83 | Resp 18 | Ht 70.0 in | Wt 244.0 lb

## 2011-08-27 DIAGNOSIS — I10 Essential (primary) hypertension: Secondary | ICD-10-CM | POA: Insufficient documentation

## 2011-08-27 DIAGNOSIS — E1142 Type 2 diabetes mellitus with diabetic polyneuropathy: Secondary | ICD-10-CM | POA: Insufficient documentation

## 2011-08-27 DIAGNOSIS — Z79899 Other long term (current) drug therapy: Secondary | ICD-10-CM | POA: Insufficient documentation

## 2011-08-27 DIAGNOSIS — G587 Mononeuritis multiplex: Secondary | ICD-10-CM

## 2011-08-27 DIAGNOSIS — M129 Arthropathy, unspecified: Secondary | ICD-10-CM | POA: Insufficient documentation

## 2011-08-27 DIAGNOSIS — T4275XA Adverse effect of unspecified antiepileptic and sedative-hypnotic drugs, initial encounter: Secondary | ICD-10-CM | POA: Insufficient documentation

## 2011-08-27 DIAGNOSIS — R259 Unspecified abnormal involuntary movements: Secondary | ICD-10-CM | POA: Insufficient documentation

## 2011-08-27 DIAGNOSIS — M47812 Spondylosis without myelopathy or radiculopathy, cervical region: Secondary | ICD-10-CM

## 2011-08-27 DIAGNOSIS — IMO0002 Reserved for concepts with insufficient information to code with codable children: Secondary | ICD-10-CM

## 2011-08-27 DIAGNOSIS — E1149 Type 2 diabetes mellitus with other diabetic neurological complication: Secondary | ICD-10-CM | POA: Insufficient documentation

## 2011-08-27 DIAGNOSIS — M7989 Other specified soft tissue disorders: Secondary | ICD-10-CM | POA: Insufficient documentation

## 2011-08-27 DIAGNOSIS — M961 Postlaminectomy syndrome, not elsewhere classified: Secondary | ICD-10-CM | POA: Insufficient documentation

## 2011-08-27 MED ORDER — OXYCODONE-ACETAMINOPHEN 7.5-325 MG PO TABS: 1.0000 | ORAL_TABLET | Freq: Three times a day (TID) | ORAL | Status: DC

## 2011-08-27 MED ORDER — OXYMORPHONE HCL ER 20 MG PO TB12: 20.0000 mg | ORAL_TABLET | Freq: Two times a day (BID) | ORAL | Status: DC

## 2011-08-27 NOTE — Telephone Encounter (Signed)
Lyrica dose 75mg  tid was reduced to bid on 06-27-11, but due to an entry error it was transcribed as 50mg . Anthony Moran can tell the difference and would prefer 75mg  bid. Please advise or order lyrica 75mg  bid for him. Thanks

## 2011-08-27 NOTE — Progress Notes (Signed)
Reports having tripped at home and having increased left hip pain. Using ice to area. Discussed again his lyrica dose which was brought up last month but not resolved. Will check w/ Dr Wynn Banker about this. UDS collected today.

## 2011-08-28 MED ORDER — PREGABALIN 75 MG PO CAPS: 75.0000 mg | ORAL_CAPSULE | Freq: Two times a day (BID) | ORAL | Status: DC

## 2011-08-28 NOTE — Telephone Encounter (Signed)
Okay for 75 mg Lyrica twice a day

## 2011-09-14 ENCOUNTER — Other Ambulatory Visit: Payer: Self-pay | Admitting: *Deleted

## 2011-09-14 MED ORDER — DULOXETINE HCL 60 MG PO CPEP: 60.0000 mg | ORAL_CAPSULE | Freq: Every day | ORAL | Status: DC

## 2011-09-24 ENCOUNTER — Encounter: Payer: Medicaid Other | Attending: Physical Medicine & Rehabilitation | Admitting: *Deleted

## 2011-09-24 ENCOUNTER — Encounter: Payer: Self-pay | Admitting: *Deleted

## 2011-09-24 VITALS — BP 155/73 | HR 74 | Ht 70.5 in | Wt 242.0 lb

## 2011-09-24 DIAGNOSIS — M129 Arthropathy, unspecified: Secondary | ICD-10-CM | POA: Insufficient documentation

## 2011-09-24 DIAGNOSIS — M4802 Spinal stenosis, cervical region: Secondary | ICD-10-CM

## 2011-09-24 DIAGNOSIS — M47812 Spondylosis without myelopathy or radiculopathy, cervical region: Secondary | ICD-10-CM

## 2011-09-24 DIAGNOSIS — Z79899 Other long term (current) drug therapy: Secondary | ICD-10-CM | POA: Insufficient documentation

## 2011-09-24 DIAGNOSIS — I10 Essential (primary) hypertension: Secondary | ICD-10-CM | POA: Insufficient documentation

## 2011-09-24 DIAGNOSIS — IMO0002 Reserved for concepts with insufficient information to code with codable children: Secondary | ICD-10-CM

## 2011-09-24 DIAGNOSIS — E1149 Type 2 diabetes mellitus with other diabetic neurological complication: Secondary | ICD-10-CM | POA: Insufficient documentation

## 2011-09-24 DIAGNOSIS — M961 Postlaminectomy syndrome, not elsewhere classified: Secondary | ICD-10-CM | POA: Insufficient documentation

## 2011-09-24 DIAGNOSIS — G587 Mononeuritis multiplex: Secondary | ICD-10-CM | POA: Insufficient documentation

## 2011-09-24 MED ORDER — OXYCODONE-ACETAMINOPHEN 7.5-325 MG PO TABS: 1.0000 | ORAL_TABLET | Freq: Three times a day (TID) | ORAL | Status: DC

## 2011-09-24 MED ORDER — OXYMORPHONE HCL ER 20 MG PO TB12: 20.0000 mg | ORAL_TABLET | Freq: Two times a day (BID) | ORAL | Status: DC

## 2011-09-24 NOTE — Progress Notes (Signed)
No changes noted. Advised to keep medication in a safe place.

## 2011-09-25 ENCOUNTER — Ambulatory Visit: Payer: Medicaid Other | Admitting: Physical Medicine & Rehabilitation

## 2011-09-29 ENCOUNTER — Encounter (HOSPITAL_COMMUNITY): Payer: Self-pay | Admitting: *Deleted

## 2011-09-29 ENCOUNTER — Emergency Department (HOSPITAL_COMMUNITY)
Admission: EM | Admit: 2011-09-29 | Discharge: 2011-09-29 | Disposition: A | Discharge status: Home / Self Care | Payer: Medicaid Other | Source: Home / Self Care

## 2011-09-29 DIAGNOSIS — K089 Disorder of teeth and supporting structures, unspecified: Secondary | ICD-10-CM

## 2011-09-29 DIAGNOSIS — K0889 Other specified disorders of teeth and supporting structures: Secondary | ICD-10-CM

## 2011-09-29 MED ORDER — PENICILLIN V POTASSIUM 500 MG PO TABS: 500.0000 mg | ORAL_TABLET | Freq: Three times a day (TID) | ORAL | Status: AC

## 2011-09-29 NOTE — Discharge Instructions (Signed)
Continue your current pain medications. You may use cool compresses or ice packs to your face to help with swelling and discomfort. Teething gel, such as Oragel as needed. Keep your appt with your dentist next week as planned. If your symptoms change or worsen, go to the ER for further evaluation.    Dental Pain Toothache is pain in or around a tooth. It may get worse with chewing or with cold or heat.  HOME CARE  Your dentist may use a numbing medicine during treatment. If so, you may need to avoid eating until the medicine wears off. Ask your dentist about this.   Only take medicine as told by your dentist or doctor.   Avoid chewing food near the painful tooth until after all treatment is done. Ask your dentist about this.  GET HELP RIGHT AWAY IF:   The problem gets worse or new problems appear.   You have a fever.   There is redness and puffiness (swelling) of the face, jaw, or neck.   You cannot open your mouth.   There is pain in the jaw.   There is very bad pain that is not helped by medicine.  MAKE SURE YOU:   Understand these instructions.   Will watch your condition.   Will get help right away if you are not doing well or get worse.  Document Released: 11/14/2007 Document Revised: 05/17/2011 Document Reviewed: 11/14/2007 [facility] Patient Information 2012 [place], [place].

## 2011-09-29 NOTE — ED Provider Notes (Signed)
History     CSN: [id number]  Arrival date & time 09/29/11  1033   None     Chief Complaint  Patient presents with  . Dental Pain    (Consider location/radiation/quality/duration/timing/severity/associated sxs/prior treatment) HPI Comments: Patient presents today with complaints of pain Rt upper central incisor, onset 3 days ago. He reports pain and swelling extending up into his upper lip greater on the right side. He has had no dental injury. He has scheduled an appointment with his dentist for Tuesday of this coming week. He reports that he also has pain in bilateral lower gum "at the corners" of his teeth.    Past Medical History  Diagnosis Date  . Hypertension   . Diabetes mellitus   . Anxiety   . Depression   . Arthritis   . Asthma   . Cervical spondylosis without myelopathy   . Spinal stenosis in cervical region   . Lumbar post-laminectomy syndrome   . Lumbosacral neuritis   . Mononeuritis multiplex     Past Surgical History  Procedure Date  . Lumbar fusion     L 4-5, L5 si    Family History  Problem Relation Age of Onset  . Breast cancer Maternal Aunt   . Prostate cancer Father   . Hypertension Sister     History  Substance Use Topics  . Smoking status: Never Smoker   . Smokeless tobacco: Never Used  . Alcohol Use: No     rare      Review of Systems  Constitutional: Negative for fever and chills.  HENT: Negative for ear pain, congestion, sore throat, mouth sores and sinus pressure.     Allergies  Bidil  Home Medications   Current Outpatient Rx  Name Route Sig Dispense Refill  . AMLODIPINE BESYLATE 10 MG PO TABS Oral Take 10 mg by mouth daily.      Marland Kitchen CETIRIZINE HCL 10 MG PO TABS Oral Take 10 mg by mouth daily.      Marland Kitchen CLONIDINE HCL 0.3 MG/24HR TD PTWK Transdermal Place 1 patch onto the skin once a week.    . DULOXETINE HCL 60 MG PO CPEP Oral Take 1 capsule (60 mg total) by mouth daily. 30 capsule 3  . LOSARTAN POTASSIUM-HCTZ 100-12.5 MG PO  TABS Oral Take 1 tablet by mouth daily.    Marland Kitchen METFORMIN HCL 500 MG PO TABS Oral Take 500 mg by mouth daily with breakfast.      . METHYLCELLULOSE (LAXATIVE) PO PACK Oral Take 1 each by mouth at bedtime.    . OXYCODONE-ACETAMINOPHEN 7.5-325 MG PO TABS Oral Take 1 tablet by mouth 3 (three) times daily. 90 tablet 0  . OXYMORPHONE HCL ER 20 MG PO TB12 Oral Take 1 tablet (20 mg total) by mouth every 12 (twelve) hours. 69 tablet 0  . POLYETHYLENE GLYCOL 3350 PO PACK Oral Take 17 g by mouth daily as needed.    Marland Kitchen PREGABALIN 75 MG PO CAPS Oral Take 1 capsule (75 mg total) by mouth 2 (two) times daily. 60 capsule 5  . SULINDAC 200 MG PO TABS Oral Take 200 mg by mouth 2 (two) times daily.    Marland Kitchen PENICILLIN V POTASSIUM 500 MG PO TABS Oral Take 1 tablet (500 mg total) by mouth 3 (three) times daily. 30 tablet 0  . TAMSULOSIN HCL 0.4 MG PO CAPS Oral Take 0.4 mg by mouth at bedtime.      BP 144/89  Pulse 86  Temp(Src) 99.5 F (37.5  C) (Oral)  Resp 18  SpO2 96%  Physical Exam  Nursing note and vitals reviewed. Constitutional: He appears well-developed and well-nourished. No distress.  HENT:  Head: Normocephalic and atraumatic.  Right Ear: Tympanic membrane, external ear and ear canal normal.  Left Ear: Tympanic membrane, external ear and ear canal normal.  Nose: Nose normal.  Mouth/Throat: Uvula is midline, oropharynx is clear and moist and mucous membranes are normal. No oral lesions. Abnormal dentition (bilat upper & lower molars and premolars are absent). No dental abscesses. No oropharyngeal exudate, posterior oropharyngeal edema or posterior oropharyngeal erythema.  Neck: Neck supple.  Cardiovascular: Normal rate, regular rhythm and normal heart sounds.   Pulmonary/Chest: Effort normal and breath sounds normal. No respiratory distress.  Lymphadenopathy:    He has no cervical adenopathy.  Neurological: He is alert.  Skin: Skin is warm and dry.  Psychiatric: He has a normal mood and affect.     ED Course  Procedures (including critical care time)  Labs Reviewed - No data to display No results found.   1. Pain, dental       MDM  Dental pain. No visible sign of abscess. Pt on chronic pain meds. Rx today of Pen VK. To f/u with dentist as planned.         Melody Comas, [place] 09/29/11 1145

## 2011-09-29 NOTE — ED Provider Notes (Signed)
Medical screening examination/treatment/procedure(s) were performed by non-physician practitioner and as supervising physician I was immediately available for consultation/collaboration.  Leslee Home, M.D.   Reuben Likes, MD 09/29/11 [phone]

## 2011-09-29 NOTE — ED Notes (Signed)
Right upper tooth pain onset Wednesday now spreading across upper gums - pt also with c/o pain bilateral lower gums

## 2011-10-25 ENCOUNTER — Ambulatory Visit: Payer: Medicaid Other | Admitting: Physical Medicine & Rehabilitation

## 2011-10-29 ENCOUNTER — Ambulatory Visit (HOSPITAL_BASED_OUTPATIENT_CLINIC_OR_DEPARTMENT_OTHER): Payer: Medicaid Other | Admitting: Physical Medicine & Rehabilitation

## 2011-10-29 ENCOUNTER — Encounter: Payer: Medicaid Other | Attending: Neurosurgery

## 2011-10-29 ENCOUNTER — Encounter: Payer: Self-pay | Admitting: Physical Medicine & Rehabilitation

## 2011-10-29 VITALS — BP 131/81 | HR 83 | Resp 16 | Ht 70.5 in | Wt 244.2 lb

## 2011-10-29 DIAGNOSIS — I10 Essential (primary) hypertension: Secondary | ICD-10-CM | POA: Insufficient documentation

## 2011-10-29 DIAGNOSIS — M4802 Spinal stenosis, cervical region: Secondary | ICD-10-CM | POA: Insufficient documentation

## 2011-10-29 DIAGNOSIS — M129 Arthropathy, unspecified: Secondary | ICD-10-CM | POA: Insufficient documentation

## 2011-10-29 DIAGNOSIS — E1141 Type 2 diabetes mellitus with diabetic mononeuropathy: Secondary | ICD-10-CM

## 2011-10-29 DIAGNOSIS — M961 Postlaminectomy syndrome, not elsewhere classified: Secondary | ICD-10-CM | POA: Insufficient documentation

## 2011-10-29 DIAGNOSIS — E1142 Type 2 diabetes mellitus with diabetic polyneuropathy: Secondary | ICD-10-CM

## 2011-10-29 DIAGNOSIS — IMO0002 Reserved for concepts with insufficient information to code with codable children: Secondary | ICD-10-CM | POA: Insufficient documentation

## 2011-10-29 DIAGNOSIS — G587 Mononeuritis multiplex: Secondary | ICD-10-CM

## 2011-10-29 DIAGNOSIS — E1149 Type 2 diabetes mellitus with other diabetic neurological complication: Secondary | ICD-10-CM

## 2011-10-29 DIAGNOSIS — Z79899 Other long term (current) drug therapy: Secondary | ICD-10-CM | POA: Insufficient documentation

## 2011-10-29 HISTORY — DX: Type 2 diabetes mellitus with diabetic mononeuropathy: E11.41

## 2011-10-29 MED ORDER — OXYCODONE-ACETAMINOPHEN 7.5-325 MG PO TABS: 1.0000 | ORAL_TABLET | Freq: Three times a day (TID) | ORAL | Status: DC

## 2011-10-29 MED ORDER — OXYMORPHONE HCL ER 20 MG PO TB12: 20.0000 mg | ORAL_TABLET | Freq: Two times a day (BID) | ORAL | Status: DC

## 2011-10-29 NOTE — Progress Notes (Signed)
Subjective:    Patient ID: Anthony Moran, male    DOB: [birth year], 61 y.o.   MRN: [id number]  HPI  A 61 year old male with lumbar post laminectomy syndrome,  chronic lumbar radiculitis. He also has mononeuropathy multiplex with  his diabetes. He has a history of cervical stenosis.  The patient is reluctant to have any type of further operative procedures even though he feels like his pain is not getting better and maybe slowly is getting worse. He has no falls. He's had no new weakness. CURRENT MEDICATIONS:  1. Voltaren gel to his right hand q.i.d. p.r.n.  2. Q.i.d. Cymbalta 60 mg per day.  3. Lyrica 75 t.i.d. was b.i.d. before in October.  4. Oxycodone 7.5/325 t.i.d.  5. Opana ER 20 mg b.i.d.  Pain Inventory Average Pain 9 Pain Right Now 8 My pain is intermittent, sharp, burning, stabbing, tingling and aching  In the last 24 hours, has pain interfered with the following? General activity 2 Relation with others 4 Enjoyment of life 1 What TIME of day is your pain at its worst? morning Sleep (in general) Poor  Pain is worse with: walking, bending, sitting, inactivity and standing Pain improves with: rest, heat/ice and medication Relief from Meds: 3  Mobility walk without assistance how many minutes can you walk? 3-4 ability to climb steps?  no do you drive?  no  Function disabled: date disabled - I need assistance with the following:  dressing, bathing, meal prep, household duties and shopping  Neuro/Psych weakness numbness tremor tingling trouble walking spasms dizziness confusion depression anxiety loss of taste or smell  Prior Studies Any changes since last visit?  no  Physicians involved in your care Any changes since last visit?  no     Review of Systems  Constitutional: Positive for diaphoresis and unexpected weight change.       Night sweats, weight gain/loss, high blood sugar, poor appetite, loss of sense of taste or  smell  Respiratory:  Positive for apnea and wheezing.        Sleep apnea  Cardiovascular: Positive for leg swelling.  Gastrointestinal: Positive for nausea, abdominal pain and constipation.  All other systems reviewed and are negative.       Objective:   Physical Exam  Constitutional: He is oriented to person, place, and time. He appears well-developed and well-nourished.  Neurological: He is alert and oriented to person, place, and time. He has normal strength. A sensory deficit is present.  Reflex Scores:      Tricep reflexes are 2+ on the right side and 2+ on the left side.      Bicep reflexes are 2+ on the right side and 2+ on the left side.      Brachioradialis reflexes are 2+ on the right side and 2+ on the left side.      Patellar reflexes are 2+ on the right side and 2+ on the left side.      Achilles reflexes are 1+ on the right side and 1+ on the left side.      Decreased sensation in bilateral feet to pinprick  Skin: Skin is warm and dry.       Feet have no signs of any type of lesions. Skin is well maintained  Psychiatric: He has a normal mood and affect. His behavior is normal. Judgment and thought content normal.          Assessment & Plan:  1. Lumbar postlaminectomy syndrome with L4-5 and L5-S1 fusion. He  has moderate to severe adjacent level disease at L3-L4 and chronic pain as a result.  2. Cervical stenosis with right C6 foraminal stenosis and chronic intermittent right upper extremity radiculopathy.  3. Mononeuritis multiplex associated with diabetes he has pain associated with this condition as well. Continue Lyrica at the current dosage. He does not tolerate higher dosages.  4. Chronic pain syndrome functioning reasonably well on oxymorphone 20 mg extended release twice a day oxycodone 7.5 mg 3 times per day in addition to Cymbalta 60 mg a day and Lyrica 75 mg twice a day                                       PA visit in one month, M.D. Visit as needed.

## 2011-10-29 NOTE — Patient Instructions (Signed)
Pa visit next time Cont current meds Walk as much as possible

## 2011-11-23 ENCOUNTER — Encounter: Payer: Self-pay | Admitting: Physical Medicine and Rehabilitation

## 2011-11-23 ENCOUNTER — Encounter: Payer: Medicaid Other | Attending: Physical Medicine & Rehabilitation | Admitting: Physical Medicine and Rehabilitation

## 2011-11-23 VITALS — BP 130/82 | HR 76 | Resp 16 | Ht 70.5 in | Wt 240.0 lb

## 2011-11-23 DIAGNOSIS — G8929 Other chronic pain: Secondary | ICD-10-CM | POA: Insufficient documentation

## 2011-11-23 DIAGNOSIS — Z981 Arthrodesis status: Secondary | ICD-10-CM | POA: Insufficient documentation

## 2011-11-23 DIAGNOSIS — M542 Cervicalgia: Secondary | ICD-10-CM

## 2011-11-23 DIAGNOSIS — M79609 Pain in unspecified limb: Secondary | ICD-10-CM | POA: Insufficient documentation

## 2011-11-23 DIAGNOSIS — M79671 Pain in right foot: Secondary | ICD-10-CM

## 2011-11-23 DIAGNOSIS — M545 Low back pain, unspecified: Secondary | ICD-10-CM | POA: Insufficient documentation

## 2011-11-23 DIAGNOSIS — I10 Essential (primary) hypertension: Secondary | ICD-10-CM | POA: Insufficient documentation

## 2011-11-23 DIAGNOSIS — E1149 Type 2 diabetes mellitus with other diabetic neurological complication: Secondary | ICD-10-CM | POA: Insufficient documentation

## 2011-11-23 DIAGNOSIS — G894 Chronic pain syndrome: Secondary | ICD-10-CM | POA: Insufficient documentation

## 2011-11-23 DIAGNOSIS — M961 Postlaminectomy syndrome, not elsewhere classified: Secondary | ICD-10-CM | POA: Insufficient documentation

## 2011-11-23 DIAGNOSIS — G587 Mononeuritis multiplex: Secondary | ICD-10-CM | POA: Insufficient documentation

## 2011-11-23 DIAGNOSIS — M4802 Spinal stenosis, cervical region: Secondary | ICD-10-CM | POA: Insufficient documentation

## 2011-11-23 MED ORDER — OXYMORPHONE HCL ER 20 MG PO TB12: 20.0000 mg | ORAL_TABLET | Freq: Two times a day (BID) | ORAL | Status: DC

## 2011-11-23 MED ORDER — OXYCODONE-ACETAMINOPHEN 7.5-325 MG PO TABS: 1.0000 | ORAL_TABLET | Freq: Three times a day (TID) | ORAL | Status: DC

## 2011-11-23 NOTE — Progress Notes (Signed)
Subjective:    Patient ID: Anthony Moran, male    DOB: 1950-06-22, 61 y.o.   MRN: [id number]  HPI The patient complains about chronic pain in his feet bilateral. The patient also complains about numbness and tingling in feet and lower legs bilateral.He also complains about LBP, radiating into his LE bilateral. The problem has been stable.  Pain Inventory Average Pain 9 Pain Right Now 7 My pain is sharp, burning, stabbing, tingling and aching  In the last 24 hours, has pain interfered with the following? General activity 8 Relation with others 4 Enjoyment of life 9 What TIME of day is your pain at its worst? morning Sleep (in general) Poor  Pain is worse with: walking, bending, sitting, inactivity and standing Pain improves with: heat/ice and medication Relief from Meds: 3  Mobility walk without assistance how many minutes can you walk? 3-4 ability to climb steps?  no do you drive?  no transfers alone  Function not employed: date last employed  I need assistance with the following:  dressing, bathing, meal prep, household duties and shopping  Neuro/Psych weakness numbness tremor tingling trouble walking spasms dizziness confusion depression anxiety loss of taste or smell  Prior Studies Any changes since last visit?  no  Physicians involved in your care Any changes since last visit?  no   Family History  Problem Relation Age of Onset  . Breast cancer Maternal Aunt   . Prostate cancer Father   . Hypertension Sister    History   Social History  . Marital Status: Married    Spouse Name: N/A    Number of Children: 3  . Years of Education: N/A   Occupational History  .     Social History Main Topics  . Smoking status: Never Smoker   . Smokeless tobacco: Never Used  . Alcohol Use: No     rare  . Drug Use: No  . Sexually Active: None   Other Topics Concern  . None   Social History Narrative  . None   Past Surgical History  Procedure Date    . Lumbar fusion     L 4-5, L5 si   Past Medical History  Diagnosis Date  . Hypertension   . Diabetes mellitus   . Anxiety   . Depression   . Arthritis   . Asthma   . Cervical spondylosis without myelopathy   . Spinal stenosis in cervical region   . Lumbar post-laminectomy syndrome   . Lumbosacral neuritis   . Mononeuritis multiplex    BP 130/82  Pulse 76  Resp 16  Ht 5' 10.5" (1.791 m)  Wt 240 lb (108.863 kg)  BMI 33.95 kg/m2  SpO2 95%      Review of Systems  Constitutional: Positive for diaphoresis, appetite change and unexpected weight change.  HENT: Negative.   Eyes: Negative.   Respiratory: Negative.   Cardiovascular: Positive for leg swelling.  Gastrointestinal: Positive for nausea, abdominal pain and constipation.  Genitourinary: Negative.   Musculoskeletal: Positive for back pain.  Skin: Negative.   Neurological: Positive for tremors, weakness and numbness.  Hematological: Negative.   Psychiatric/Behavioral: Negative.        Objective:   Physical Exam  Symmetric normal motor tone is noted throughout. Normal muscle bulk. Muscle testing reveals 5/5 muscle strength of the upper extremity, and 5/5 of the lower extremity. Full range of motion in upper and lower extremities. ROM of spine is not restricted. Fine motor movements are normal in both  hands. Sensory is decreased  to light touch and pinprick in both feet, right worse than left,  Proprioception intact. DTR in the upper and lower extremity are present and symmetric 2+. No clonus is noted.  Patient arises from chair without difficulty. Narrow based gait with normal arm swing bilateral .      Assessment & Plan:  1. Lumbar postlaminectomy syndrome with L4-5 and L5-S1 fusion. He has moderate to severe adjacent level disease at L3-L4 and chronic pain as a result.  2. Cervical stenosis with right C6 foraminal stenosis and chronic intermittent right upper extremity radiculopathy.  3. Mononeuritis  multiplex associated with diabetes he has pain associated with this condition as well. Continue Lyrica at the current dosage. He does not tolerate higher dosages.  4. Chronic pain syndrome functioning reasonably well on oxymorphone 20 mg extended release twice a day oxycodone 7.5 mg 3 times per day in addition to Cymbalta 60 mg a day. He had some increased pain and went back to Lyrica 75 mg 3 times a day . Plan Patient to continue with walking program and try to do some exercises he learned from PT.

## 2011-11-23 NOTE — Patient Instructions (Signed)
Continue with medication, increase Lyrica to tid, try to get back into exercising.

## 2011-11-26 ENCOUNTER — Ambulatory Visit: Payer: Medicaid Other | Admitting: Physical Medicine and Rehabilitation

## 2011-12-20 ENCOUNTER — Encounter: Payer: Medicaid Other | Attending: Physical Medicine & Rehabilitation | Admitting: Physical Medicine and Rehabilitation

## 2011-12-20 ENCOUNTER — Encounter: Payer: Self-pay | Admitting: Physical Medicine and Rehabilitation

## 2011-12-20 VITALS — BP 129/78 | HR 91 | Resp 16 | Ht 70.5 in | Wt 239.4 lb

## 2011-12-20 DIAGNOSIS — M545 Low back pain, unspecified: Secondary | ICD-10-CM | POA: Insufficient documentation

## 2011-12-20 DIAGNOSIS — G894 Chronic pain syndrome: Secondary | ICD-10-CM | POA: Insufficient documentation

## 2011-12-20 DIAGNOSIS — M79609 Pain in unspecified limb: Secondary | ICD-10-CM | POA: Insufficient documentation

## 2011-12-20 DIAGNOSIS — R209 Unspecified disturbances of skin sensation: Secondary | ICD-10-CM | POA: Insufficient documentation

## 2011-12-20 DIAGNOSIS — M961 Postlaminectomy syndrome, not elsewhere classified: Secondary | ICD-10-CM | POA: Insufficient documentation

## 2011-12-20 DIAGNOSIS — I10 Essential (primary) hypertension: Secondary | ICD-10-CM | POA: Insufficient documentation

## 2011-12-20 DIAGNOSIS — M4802 Spinal stenosis, cervical region: Secondary | ICD-10-CM | POA: Insufficient documentation

## 2011-12-20 DIAGNOSIS — G587 Mononeuritis multiplex: Secondary | ICD-10-CM | POA: Insufficient documentation

## 2011-12-20 DIAGNOSIS — E119 Type 2 diabetes mellitus without complications: Secondary | ICD-10-CM | POA: Insufficient documentation

## 2011-12-20 MED ORDER — PREGABALIN 75 MG PO CAPS: 75.0000 mg | ORAL_CAPSULE | Freq: Three times a day (TID) | ORAL | Status: DC

## 2011-12-20 NOTE — Patient Instructions (Signed)
Try to stay as active as possible 

## 2011-12-20 NOTE — Progress Notes (Signed)
Subjective:    Patient ID: Anthony Moran, male    DOB: 27-Jun-1950, 61 y.o.   MRN: [id number]  HPI The patient complains about chronic pain in his feet bilateral. The patient also complains about numbness and tingling in feet and lower legs bilateral.He also complains about LBP, radiating into his LE bilateral.  The problem has gotten worse, since he had a stomach virus and could not keep his pain medication down. Because of that he states, that he does not need a refill of his pain medication today.   Pain Inventory Average Pain 7 Pain Right Now 7 My pain is constant, sharp, burning, stabbing, tingling and aching  In the last 24 hours, has pain interfered with the following? General activity 2 Relation with others 3 Enjoyment of life 2 What TIME of day is your pain at its worst? morning Sleep (in general) Poor  Pain is worse with: walking, bending, sitting, inactivity and standing Pain improves with: heat/ice and medication Relief from Meds: 4  Mobility walk without assistance how many minutes can you walk? 3-4 ability to climb steps?  no do you drive?  no  Function disabled: date disabled  I need assistance with the following:  dressing, meal prep, household duties and shopping  Neuro/Psych weakness numbness tremor tingling trouble walking spasms dizziness confusion depression anxiety loss of taste or smell  Prior Studies Any changes since last visit?  no  Physicians involved in your care Any changes since last visit?  no   Family History  Problem Relation Age of Onset  . Breast cancer Maternal Aunt   . Prostate cancer Father   . Hypertension Sister    History   Social History  . Marital Status: Married    Spouse Name: N/A    Number of Children: 3  . Years of Education: N/A   Occupational History  .     Social History Main Topics  . Smoking status: Never Smoker   . Smokeless tobacco: Never Used  . Alcohol Use: No     rare  . Drug Use: No    . Sexually Active: None   Other Topics Concern  . None   Social History Narrative  . None   Past Surgical History  Procedure Date  . Lumbar fusion     L 4-5, L5 si   Past Medical History  Diagnosis Date  . Hypertension   . Diabetes mellitus   . Anxiety   . Depression   . Arthritis   . Asthma   . Cervical spondylosis without myelopathy   . Spinal stenosis in cervical region   . Lumbar post-laminectomy syndrome   . Lumbosacral neuritis   . Mononeuritis multiplex    BP 129/78  Pulse 91  Resp 16  Ht 5' 10.5" (1.791 m)  Wt 239 lb 6.4 oz (108.591 kg)  BMI 33.86 kg/m2  SpO2 96%    Review of Systems  Constitutional: Positive for diaphoresis, appetite change and unexpected weight change.       Poor appetite, night sweats, weight gain, loss of sense taste at times  Respiratory: Positive for apnea, shortness of breath and wheezing.   Cardiovascular: Positive for leg swelling.  Gastrointestinal: Positive for nausea, vomiting, abdominal pain and constipation.  Musculoskeletal: Positive for back pain.       Neck pain  Neurological: Positive for dizziness and numbness.  Psychiatric/Behavioral: Positive for confusion and dysphoric mood. The patient is nervous/anxious.   All other systems reviewed and are negative.  Objective:   Physical Exam Symmetric normal motor tone is noted throughout. Normal muscle bulk. Muscle testing reveals 5/5 muscle strength of the upper extremity, and 5/5 of the lower extremity. Full range of motion in upper and lower extremities. ROM of spine is not restricted. Fine motor movements are normal in both hands.  Sensory is decreased to light touch and pinprick in both feet, right worse than left, Proprioception intact.  DTR in the upper and lower extremity are present and symmetric 2+. No clonus is noted.  Patient arises from chair without difficulty. Narrow based gait with normal arm swing bilateral .        Assessment & Plan:  1.  Lumbar postlaminectomy syndrome with L4-5 and L5-S1 fusion. He has moderate to severe adjacent level disease at L3-L4 and chronic pain as a result.  2. Cervical stenosis with right C6 foraminal stenosis and chronic intermittent right upper extremity radiculopathy.  3. Mononeuritis multiplex associated with diabetes he has pain associated with this condition as well.  4. Chronic pain syndrome functioning reasonably well on oxymorphone 20 mg extended release twice a day oxycodone 7.5 mg 3 times per day in addition to Cymbalta 60 mg a day. He had some increased pain and went back to Lyrica 75 mg 3 times a day, refilled his Lyrica .  Plan  Patient to continue with walking program and try to do some exercises he learned from PT, as soon as he is able to restart.Might consider ordering aquatic PT at next visit, which has helped him in the past.

## 2011-12-28 ENCOUNTER — Telehealth: Payer: Self-pay | Admitting: Physical Medicine & Rehabilitation

## 2011-12-28 NOTE — Telephone Encounter (Signed)
Having problems getting refill

## 2011-12-28 NOTE — Telephone Encounter (Signed)
Pt aware that we had to do a prior auth on Lyrica. I advised him that if he can't get his medication by 01/01/12 to give [place] a call.

## 2012-01-01 ENCOUNTER — Telehealth: Payer: Self-pay | Admitting: *Deleted

## 2012-01-01 NOTE — Telephone Encounter (Signed)
Status of Lyrica being filled. Please call

## 2012-01-01 NOTE — Telephone Encounter (Signed)
Refaxed prior auth. Pt aware.

## 2012-01-08 ENCOUNTER — Telehealth: Payer: Self-pay | Admitting: Physical Medicine & Rehabilitation

## 2012-01-08 MED ORDER — GABAPENTIN 300 MG PO CAPS: 300.0000 mg | ORAL_CAPSULE | Freq: Three times a day (TID) | ORAL | Status: DC

## 2012-01-08 NOTE — Telephone Encounter (Signed)
Lyrica still not filled.

## 2012-01-08 NOTE — Telephone Encounter (Signed)
We have done several prior auths on this and haven't heard anything back. Per Dr. Wynn Banker for now he can take Gabapentin 300mg  TID. Pt is aware that this has been sent in for him.

## 2012-01-10 ENCOUNTER — Other Ambulatory Visit: Payer: Self-pay | Admitting: Physical Medicine & Rehabilitation

## 2012-01-18 ENCOUNTER — Encounter: Payer: Self-pay | Admitting: Physical Medicine and Rehabilitation

## 2012-01-18 ENCOUNTER — Encounter
Payer: Medicaid Other | Attending: Physical Medicine and Rehabilitation | Admitting: Physical Medicine and Rehabilitation

## 2012-01-18 VITALS — BP 155/86 | HR 90 | Resp 14 | Ht 70.0 in | Wt 237.0 lb

## 2012-01-18 DIAGNOSIS — M5412 Radiculopathy, cervical region: Secondary | ICD-10-CM | POA: Insufficient documentation

## 2012-01-18 DIAGNOSIS — M542 Cervicalgia: Secondary | ICD-10-CM

## 2012-01-18 DIAGNOSIS — G587 Mononeuritis multiplex: Secondary | ICD-10-CM | POA: Insufficient documentation

## 2012-01-18 DIAGNOSIS — M545 Low back pain, unspecified: Secondary | ICD-10-CM | POA: Insufficient documentation

## 2012-01-18 DIAGNOSIS — M4802 Spinal stenosis, cervical region: Secondary | ICD-10-CM | POA: Insufficient documentation

## 2012-01-18 DIAGNOSIS — R209 Unspecified disturbances of skin sensation: Secondary | ICD-10-CM | POA: Insufficient documentation

## 2012-01-18 DIAGNOSIS — M79609 Pain in unspecified limb: Secondary | ICD-10-CM | POA: Insufficient documentation

## 2012-01-18 DIAGNOSIS — G894 Chronic pain syndrome: Secondary | ICD-10-CM | POA: Insufficient documentation

## 2012-01-18 DIAGNOSIS — M961 Postlaminectomy syndrome, not elsewhere classified: Secondary | ICD-10-CM | POA: Insufficient documentation

## 2012-01-18 MED ORDER — OXYCODONE-ACETAMINOPHEN 7.5-325 MG PO TABS: 1.0000 | ORAL_TABLET | Freq: Three times a day (TID) | ORAL | Status: DC

## 2012-01-18 MED ORDER — OXYMORPHONE HCL ER 20 MG PO TB12: 20.0000 mg | ORAL_TABLET | Freq: Two times a day (BID) | ORAL | Status: DC

## 2012-01-18 NOTE — Patient Instructions (Signed)
Discuss with your wife whether you would like to talk to a pain psychologist. Try to walk as much as possible.

## 2012-01-18 NOTE — Progress Notes (Signed)
Subjective:    Patient ID: Anthony Moran, male    DOB: 16-Jul-1950, 61 y.o.   MRN: [id number]  HPI The patient complains about chronic pain in his feet bilateral. The patient also complains about numbness and tingling in feet and lower legs bilateral.He also complains about LBP, radiating into his right LE.  Pain Inventory Average Pain 8 Pain Right Now 8 My pain is sharp, burning, dull, stabbing, tingling and aching  In the last 24 hours, has pain interfered with the following? General activity 9 Relation with others 5 Enjoyment of life 7 What TIME of day is your pain at its worst? morning Sleep (in general) Poor  Pain is worse with: walking, bending, sitting, inactivity and standing Pain improves with: rest and heat/ice Relief from Meds: 3  Mobility walk without assistance how many minutes can you walk? 3 ability to climb steps?  no do you drive?  no transfers alone Do you have any goals in this area?  yes  Function disabled: date disabled  I need assistance with the following:  dressing, bathing, meal prep, household duties and shopping Do you have any goals in this area?  yes  Neuro/Psych weakness numbness tremor tingling trouble walking spasms dizziness confusion depression anxiety loss of taste or smell  Prior Studies Any changes since last visit?  no  Physicians involved in your care Any changes since last visit?  no   Family History  Problem Relation Age of Onset  . Breast cancer Maternal Aunt   . Prostate cancer Father   . Hypertension Sister    History   Social History  . Marital Status: Married    Spouse Name: N/A    Number of Children: 3  . Years of Education: N/A   Occupational History  .     Social History Main Topics  . Smoking status: Never Smoker   . Smokeless tobacco: Never Used  . Alcohol Use: No     rare  . Drug Use: No  . Sexually Active: None   Other Topics Concern  . None   Social History Narrative  . None    Past Surgical History  Procedure Date  . Lumbar fusion     L 4-5, L5 si   Past Medical History  Diagnosis Date  . Hypertension   . Diabetes mellitus   . Anxiety   . Depression   . Arthritis   . Asthma   . Cervical spondylosis without myelopathy   . Spinal stenosis in cervical region   . Lumbar post-laminectomy syndrome   . Lumbosacral neuritis   . Mononeuritis multiplex    BP 155/86  Pulse 90  Resp 14  Ht 5\' 10"  (1.778 m)  Wt 237 lb (107.502 kg)  BMI 34.01 kg/m2  SpO2 100%     Review of Systems  Constitutional: Positive for diaphoresis and unexpected weight change.  HENT: Positive for neck pain.   Respiratory: Positive for apnea, shortness of breath and wheezing.   Gastrointestinal: Positive for nausea, vomiting, abdominal pain and constipation.  Musculoskeletal: Positive for myalgias, back pain, joint swelling, arthralgias and gait problem.  Neurological: Positive for dizziness, weakness and numbness.  Psychiatric/Behavioral: Positive for confusion and dysphoric mood. The patient is nervous/anxious.   All other systems reviewed and are negative.       Objective:   Physical Exam Symmetric normal motor tone is noted throughout. Normal muscle bulk. Muscle testing reveals 5/5 muscle strength of the upper extremity, and 5/5 of the lower extremity.  Full range of motion in upper and lower extremities. ROM of spine is not restricted. Fine motor movements are normal in both hands.  Sensory is decreased to light touch and pinprick in both feet, right worse than left, Proprioception intact.  DTR in the upper and lower extremity are present and symmetric 2+. No clonus is noted.  Patient arises from chair without difficulty. Narrow based gait with normal arm swing bilateral .        Assessment & Plan:  1. Lumbar postlaminectomy syndrome with L4-5 and L5-S1 fusion. He has moderate to severe adjacent level disease at L3-L4 and chronic pain as a result.  2. Cervical  stenosis with right C6 foraminal stenosis and chronic intermittent right upper extremity radiculopathy.  3. Mononeuritis multiplex associated with diabetes he has pain associated with this condition as well.  4. Chronic pain syndrome functioning reasonably well on oxymorphone 20 mg extended release twice a day oxycodone 7.5 mg 3 times per day in addition to Cymbalta 60 mg a day. He had some increased pain and went back to Lyrica 75 mg 3 times a day .  Plan  Consider referring to pain psychologist , because patient gets very irritable because of his chronic pain. Patient does not want to do aquatic therapy at this point. Advised patient to walk as much as tolerated. Follow up in 1 month.

## 2012-02-15 ENCOUNTER — Encounter: Payer: Self-pay | Admitting: Physical Medicine and Rehabilitation

## 2012-02-15 ENCOUNTER — Encounter
Payer: Medicaid Other | Attending: Physical Medicine and Rehabilitation | Admitting: Physical Medicine and Rehabilitation

## 2012-02-15 ENCOUNTER — Other Ambulatory Visit: Payer: Self-pay | Admitting: Physical Medicine and Rehabilitation

## 2012-02-15 VITALS — BP 157/80 | HR 76 | Resp 16 | Ht 70.0 in | Wt 241.0 lb

## 2012-02-15 DIAGNOSIS — G587 Mononeuritis multiplex: Secondary | ICD-10-CM | POA: Insufficient documentation

## 2012-02-15 DIAGNOSIS — M4802 Spinal stenosis, cervical region: Secondary | ICD-10-CM | POA: Insufficient documentation

## 2012-02-15 DIAGNOSIS — E119 Type 2 diabetes mellitus without complications: Secondary | ICD-10-CM | POA: Insufficient documentation

## 2012-02-15 DIAGNOSIS — R209 Unspecified disturbances of skin sensation: Secondary | ICD-10-CM | POA: Insufficient documentation

## 2012-02-15 DIAGNOSIS — M549 Dorsalgia, unspecified: Secondary | ICD-10-CM

## 2012-02-15 DIAGNOSIS — M545 Low back pain, unspecified: Secondary | ICD-10-CM | POA: Insufficient documentation

## 2012-02-15 DIAGNOSIS — M542 Cervicalgia: Secondary | ICD-10-CM

## 2012-02-15 DIAGNOSIS — Z5181 Encounter for therapeutic drug level monitoring: Secondary | ICD-10-CM

## 2012-02-15 DIAGNOSIS — I10 Essential (primary) hypertension: Secondary | ICD-10-CM | POA: Insufficient documentation

## 2012-02-15 DIAGNOSIS — G894 Chronic pain syndrome: Secondary | ICD-10-CM | POA: Insufficient documentation

## 2012-02-15 DIAGNOSIS — M79609 Pain in unspecified limb: Secondary | ICD-10-CM | POA: Insufficient documentation

## 2012-02-15 DIAGNOSIS — M961 Postlaminectomy syndrome, not elsewhere classified: Secondary | ICD-10-CM | POA: Insufficient documentation

## 2012-02-15 MED ORDER — OXYCODONE-ACETAMINOPHEN 7.5-325 MG PO TABS: 1.0000 | ORAL_TABLET | Freq: Three times a day (TID) | ORAL | Status: DC

## 2012-02-15 MED ORDER — OXYMORPHONE HCL ER 20 MG PO TB12: 20.0000 mg | ORAL_TABLET | Freq: Two times a day (BID) | ORAL | Status: DC

## 2012-02-15 NOTE — Patient Instructions (Signed)
Continue with your walking program. 

## 2012-02-15 NOTE — Progress Notes (Signed)
Subjective:    Patient ID: Anthony Moran, male    DOB: 21-Mar-1951, 61 y.o.   MRN: [id number]  HPI The patient complains about chronic pain in his feet bilateral. The patient also complains about numbness and tingling in feet and lower legs bilateral.He also complains about LBP, radiating into his right LE. Patient has a Hx of PSF L4-5, L5-S1 in early 2000s. The patient states, that he talked to his wife , whether she thinks he should see somebody for his irritability, but she stated, that he usually is coping well with his pain. He also states, that at the last visit he was out of his Cymbalta for several days, and that this could also have been the reason why he was a little more irritable at his last visit.  Pain Inventory Average Pain 9 Pain Right Now 7 My pain is sharp, burning, dull, stabbing, tingling and aching  In the last 24 hours, has pain interfered with the following? General activity 7 Relation with others 8 Enjoyment of life 6 What TIME of day is your pain at its worst? morning Sleep (in general) Fair  Pain is worse with: walking, bending, sitting, inactivity and standing Pain improves with: rest and heat/ice Relief from Meds: 3  Mobility walk without assistance ability to climb steps?  no do you drive?  no  Function disabled: date disabled  I need assistance with the following:  dressing, bathing, meal prep and shopping  Neuro/Psych weakness numbness tremor tingling trouble walking spasms dizziness confusion depression anxiety loss of taste or smell  Prior Studies Any changes since last visit?  no  Physicians involved in your care Any changes since last visit?  no   Family History  Problem Relation Age of Onset  . Breast cancer Maternal Aunt   . Prostate cancer Father   . Hypertension Sister    History   Social History  . Marital Status: Married    Spouse Name: N/A    Number of Children: 3  . Years of Education: N/A   Occupational  History  .     Social History Main Topics  . Smoking status: Never Smoker   . Smokeless tobacco: Never Used  . Alcohol Use: No     rare  . Drug Use: No  . Sexually Active: None   Other Topics Concern  . None   Social History Narrative  . None   Past Surgical History  Procedure Date  . Lumbar fusion     L 4-5, L5 si   Past Medical History  Diagnosis Date  . Hypertension   . Diabetes mellitus   . Anxiety   . Depression   . Arthritis   . Asthma   . Cervical spondylosis without myelopathy   . Spinal stenosis in cervical region   . Lumbar post-laminectomy syndrome   . Lumbosacral neuritis   . Mononeuritis multiplex    BP 157/80  Pulse 76  Resp 16  Ht 5\' 10"  (1.778 m)  Wt 241 lb (109.317 kg)  BMI 34.58 kg/m2  SpO2 94%      Review of Systems  Constitutional: Positive for diaphoresis and unexpected weight change.  HENT: Negative.   Eyes: Negative.   Respiratory: Positive for apnea and wheezing.   Cardiovascular: Positive for leg swelling.  Gastrointestinal: Positive for nausea, vomiting and constipation.  Genitourinary: Negative.   Musculoskeletal: Positive for back pain.  Skin: Negative.   Neurological: Negative.   Hematological: Negative.   Psychiatric/Behavioral: Negative.  Objective:   Physical Exam  Constitutional: He is oriented to person, place, and time. He appears well-developed and well-nourished.  HENT:  Head: Normocephalic.  Musculoskeletal: He exhibits tenderness.  Neurological: He is alert and oriented to person, place, and time.  Skin: Skin is warm and dry.  Psychiatric: He has a normal mood and affect.    Symmetric normal motor tone is noted throughout. Normal muscle bulk. Muscle testing reveals 5/5 muscle strength of the upper extremity, and 5/5 of the lower extremity. Full range of motion in upper and lower extremities. ROM of spine is not restricted. Fine motor movements are normal in both hands.  Sensory is decreased to  light touch and pinprick in both feet, right worse than left, Proprioception intact.  DTR in the upper and lower extremity are present and symmetric 2+. No clonus is noted.  Patient arises from chair without difficulty. Narrow based gait with normal arm swing bilateral .        Assessment & Plan:  1. Lumbar postlaminectomy syndrome with L4-5 and L5-S1 fusion. He has moderate to severe adjacent level disease at L3-L4 and chronic pain as a result.  2. Cervical stenosis with right C6 foraminal stenosis and chronic intermittent right upper extremity radiculopathy.  3. Mononeuritis multiplex associated with diabetes he has pain associated with this condition as well.  4. Chronic pain syndrome functioning reasonably well on oxymorphone 20 mg extended release twice a day oxycodone 7.5 mg 3 times per day in addition to Cymbalta 60 mg a day. He had some increased pain and went back to Lyrica 75 mg 3 times a day .  Plan   Advised patient to walk as much as tolerated.  Follow up in 1 month.

## 2012-03-13 ENCOUNTER — Telehealth: Payer: Self-pay | Admitting: *Deleted

## 2012-03-13 NOTE — Telephone Encounter (Signed)
[facility] prior authorization. He is completely out of medication and he is in a lot of pain. Please call.

## 2012-03-13 NOTE — Telephone Encounter (Signed)
Pt aware that the prior Berkley Harvey has been started and we are waiting on his insurance company.

## 2012-03-17 ENCOUNTER — Encounter: Payer: Self-pay | Admitting: Physical Medicine and Rehabilitation

## 2012-03-17 ENCOUNTER — Encounter
Payer: Medicaid Other | Attending: Physical Medicine and Rehabilitation | Admitting: Physical Medicine and Rehabilitation

## 2012-03-17 VITALS — BP 131/58 | HR 78 | Resp 14 | Ht 70.0 in | Wt 236.0 lb

## 2012-03-17 DIAGNOSIS — M47812 Spondylosis without myelopathy or radiculopathy, cervical region: Secondary | ICD-10-CM

## 2012-03-17 DIAGNOSIS — E1142 Type 2 diabetes mellitus with diabetic polyneuropathy: Secondary | ICD-10-CM | POA: Insufficient documentation

## 2012-03-17 DIAGNOSIS — G8929 Other chronic pain: Secondary | ICD-10-CM | POA: Insufficient documentation

## 2012-03-17 DIAGNOSIS — I10 Essential (primary) hypertension: Secondary | ICD-10-CM | POA: Insufficient documentation

## 2012-03-17 DIAGNOSIS — E1149 Type 2 diabetes mellitus with other diabetic neurological complication: Secondary | ICD-10-CM | POA: Insufficient documentation

## 2012-03-17 DIAGNOSIS — M79609 Pain in unspecified limb: Secondary | ICD-10-CM | POA: Insufficient documentation

## 2012-03-17 DIAGNOSIS — M961 Postlaminectomy syndrome, not elsewhere classified: Secondary | ICD-10-CM | POA: Insufficient documentation

## 2012-03-17 DIAGNOSIS — Z981 Arthrodesis status: Secondary | ICD-10-CM | POA: Insufficient documentation

## 2012-03-17 DIAGNOSIS — R209 Unspecified disturbances of skin sensation: Secondary | ICD-10-CM | POA: Insufficient documentation

## 2012-03-17 DIAGNOSIS — M4802 Spinal stenosis, cervical region: Secondary | ICD-10-CM | POA: Insufficient documentation

## 2012-03-17 DIAGNOSIS — G894 Chronic pain syndrome: Secondary | ICD-10-CM | POA: Insufficient documentation

## 2012-03-17 MED ORDER — OXYMORPHONE HCL ER 20 MG PO TB12: 20.0000 mg | ORAL_TABLET | Freq: Two times a day (BID) | ORAL | Status: DC

## 2012-03-17 MED ORDER — OXYCODONE-ACETAMINOPHEN 7.5-325 MG PO TABS: 1.0000 | ORAL_TABLET | Freq: Three times a day (TID) | ORAL | Status: DC

## 2012-03-17 NOTE — Progress Notes (Signed)
Subjective:    Patient ID: Anthony Moran, male    DOB: 12-22-1950, 61 y.o.   MRN: [id number]  HPI The patient complains about chronic pain in his feet bilateral. The patient also complains about numbness and tingling in feet and lower legs bilateral.He also complains about LBP, radiating into his right LE. Patient has a Hx of PSF L4-5, L5-S1 in early 2000s. The patient states, that he talked to his wife , whether she thinks he should see somebody for his irritability, but she stated, that he usually is coping well with his pain. He also states, that at the last visit he was out of his Cymbalta for several days, and that this could also have been the reason why he was a little more irritable at his last visit. The patient reports, that his insurance is not paying for the Opana, and that he is out of it for 3 weeks now, he states, that he is in constant pain, and was not able to exercise because of this pain.  Pain Inventory Average Pain 8 Pain Right Now 9 My pain is constant  In the last 24 hours, has pain interfered with the following? General activity 9 Relation with others 7 Enjoyment of life 9 What TIME of day is your pain at its worst? morning Sleep (in general) Poor  Pain is worse with: walking, bending, sitting, inactivity and standing Pain improves with: rest, heat/ice and medication Relief from Meds: 2  Mobility walk without assistance how many minutes can you walk? 1-3 ability to climb steps?  no do you drive?  no transfers alone Do you have any goals in this area?  yes  Function disabled: date disabled  I need assistance with the following:  dressing, bathing, meal prep, household duties and shopping Do you have any goals in this area?  yes  Neuro/Psych weakness numbness tremor tingling trouble walking spasms dizziness confusion depression anxiety loss of taste or smell  Prior Studies Any changes since last visit?  no  Physicians involved in your  care Any changes since last visit?  no   Family History  Problem Relation Age of Onset  . Breast cancer Maternal Aunt   . Prostate cancer Father   . Hypertension Sister    History   Social History  . Marital Status: Married    Spouse Name: N/A    Number of Children: 3  . Years of Education: N/A   Occupational History  .     Social History Main Topics  . Smoking status: Never Smoker   . Smokeless tobacco: Never Used  . Alcohol Use: No     rare  . Drug Use: No  . Sexually Active: None   Other Topics Concern  . None   Social History Narrative  . None   Past Surgical History  Procedure Date  . Lumbar fusion     L 4-5, L5 si   Past Medical History  Diagnosis Date  . Hypertension   . Diabetes mellitus   . Anxiety   . Depression   . Arthritis   . Asthma   . Cervical spondylosis without myelopathy   . Spinal stenosis in cervical region   . Lumbar post-laminectomy syndrome   . Lumbosacral neuritis   . Mononeuritis multiplex    BP 131/58  Pulse 78  Resp 14  Ht 5\' 10"  (1.778 m)  Wt 236 lb (107.049 kg)  BMI 33.86 kg/m2  SpO2 99%     Review of Systems  Constitutional: Positive for fever and unexpected weight change.  Respiratory: Positive for apnea and wheezing.   Gastrointestinal: Positive for nausea and constipation.  Musculoskeletal: Positive for myalgias, back pain, joint swelling and arthralgias.  All other systems reviewed and are negative.       Objective:   Physical Exam Constitutional: He is oriented to person, place, and time. He appears well-developed and well-nourished.  HENT:  Head: Normocephalic.  Musculoskeletal: He exhibits tenderness.  Neurological: He is alert and oriented to person, place, and time.  Skin: Skin is warm and dry.  Psychiatric: He has a normal mood and affect.   Symmetric normal motor tone is noted throughout. Normal muscle bulk. Muscle testing reveals 5/5 muscle strength of the upper extremity, and 5/5 of the  lower extremity. Full range of motion in upper and lower extremities. ROM of spine is not restricted. Fine motor movements are normal in both hands.  Sensory is decreased to light touch and pinprick in both feet, right worse than left, Proprioception intact.  DTR in the upper and lower extremity are present and symmetric 2+. No clonus is noted.  Patient arises from chair without difficulty. Narrow based gait with normal arm swing bilateral .        Assessment & Plan:  1. Lumbar postlaminectomy syndrome with L4-5 and L5-S1 fusion. He has moderate to severe adjacent level disease at L3-L4 and chronic pain as a result.  2. Cervical stenosis with right C6 foraminal stenosis and chronic intermittent right upper extremity radiculopathy.  3. Mononeuritis multiplex associated with diabetes he has pain associated with this condition as well.  4. Chronic pain syndrome functioning reasonably well on oxymorphone 20 mg extended release twice a day , prescribed Opana ER, as written today, because this is preferred from his insurance, hopefully he will not have a problem to get this filled.Also refilled oxycodone 7.5 mg 3 times per day in addition to Cymbalta 60 mg a day. He had some increased pain and went back to Lyrica 75 mg 3 times a day .  Plan  Advised patient to walk as much as tolerated.  Follow up in 1 month.

## 2012-03-17 NOTE — Patient Instructions (Signed)
Restart your exercising and walking program, as pain permits

## 2012-03-27 ENCOUNTER — Telehealth: Payer: Self-pay | Admitting: *Deleted

## 2012-03-27 NOTE — Telephone Encounter (Signed)
Left message for Mr Edu to call back and let me know if he ever got his Opana ER approved.

## 2012-04-15 ENCOUNTER — Encounter
Payer: Medicaid Other | Attending: Physical Medicine and Rehabilitation | Admitting: Physical Medicine and Rehabilitation

## 2012-04-15 ENCOUNTER — Encounter: Payer: Self-pay | Admitting: Physical Medicine and Rehabilitation

## 2012-04-15 VITALS — BP 159/97 | HR 93 | Resp 14 | Ht 70.0 in | Wt 238.0 lb

## 2012-04-15 DIAGNOSIS — G587 Mononeuritis multiplex: Secondary | ICD-10-CM | POA: Insufficient documentation

## 2012-04-15 DIAGNOSIS — R209 Unspecified disturbances of skin sensation: Secondary | ICD-10-CM | POA: Insufficient documentation

## 2012-04-15 DIAGNOSIS — M4802 Spinal stenosis, cervical region: Secondary | ICD-10-CM | POA: Insufficient documentation

## 2012-04-15 DIAGNOSIS — G8929 Other chronic pain: Secondary | ICD-10-CM | POA: Insufficient documentation

## 2012-04-15 DIAGNOSIS — E119 Type 2 diabetes mellitus without complications: Secondary | ICD-10-CM | POA: Insufficient documentation

## 2012-04-15 DIAGNOSIS — M5412 Radiculopathy, cervical region: Secondary | ICD-10-CM | POA: Insufficient documentation

## 2012-04-15 DIAGNOSIS — M961 Postlaminectomy syndrome, not elsewhere classified: Secondary | ICD-10-CM | POA: Insufficient documentation

## 2012-04-15 DIAGNOSIS — M79609 Pain in unspecified limb: Secondary | ICD-10-CM | POA: Insufficient documentation

## 2012-04-15 DIAGNOSIS — M545 Low back pain: Secondary | ICD-10-CM

## 2012-04-15 MED ORDER — OXYCODONE-ACETAMINOPHEN 10-325 MG PO TABS: 1.0000 | ORAL_TABLET | Freq: Four times a day (QID) | ORAL | Status: DC | PRN

## 2012-04-15 NOTE — Patient Instructions (Signed)
Try to stay as active as tolerated, try to exercise as pain allows

## 2012-04-15 NOTE — Progress Notes (Signed)
Subjective:    Patient ID: Anthony Moran, male    DOB: 04/06/1951, 61 y.o.   MRN: [id number]  HPI The patient complains about chronic pain in his feet bilateral. The patient also complains about numbness and tingling in feet and lower legs bilateral.He also complains about LBP, radiating into his right LE. Patient has a Hx of PSF L4-5, L5-S1 in early 2000s. The patient states, that he talked to his wife , whether she thinks he should see somebody for his irritability, but she stated, that he usually is coping well with his pain. He also states, that at the last visit he was out of his Cymbalta for several days, and that this could also have been the reason why he was a little more irritable at his last visit. The patient reports, that his insurance is not paying for the Opana, and that he is out of it for 6 weeks now, he states, that he is in constant pain, and was not able to exercise because of this pain.  Pain Inventory Average Pain 9 Pain Right Now 8 My pain is constant, sharp, burning, dull, stabbing, tingling and aching  In the last 24 hours, has pain interfered with the following? General activity 6 Relation with others 6 Enjoyment of life 6 What TIME of day is your pain at its worst? morning Sleep (in general) Fair  Pain is worse with: walking, bending and some activites Pain improves with: rest, pacing activities and medication Relief from Meds: 3  Mobility walk without assistance how many minutes can you walk? 10 ability to climb steps?  yes do you drive?  no Do you have any goals in this area?  yes  Function disabled: date disabled 1999 Do you have any goals in this area?  yes  Neuro/Psych weakness numbness tremor tingling trouble walking spasms dizziness confusion depression anxiety  Prior Studies Any changes since last visit?  no  Physicians involved in your care Any changes since last visit?  no   Family History  Problem Relation Age of Onset  .  Breast cancer Maternal Aunt   . Prostate cancer Father   . Hypertension Sister    History   Social History  . Marital Status: Married    Spouse Name: N/A    Number of Children: 3  . Years of Education: N/A   Occupational History  .     Social History Main Topics  . Smoking status: Never Smoker   . Smokeless tobacco: Never Used  . Alcohol Use: No     Comment: rare  . Drug Use: No  . Sexually Active: None   Other Topics Concern  . None   Social History Narrative  . None   Past Surgical History  Procedure Date  . Lumbar fusion     L 4-5, L5 si   Past Medical History  Diagnosis Date  . Hypertension   . Diabetes mellitus   . Anxiety   . Depression   . Arthritis   . Asthma   . Cervical spondylosis without myelopathy   . Spinal stenosis in cervical region   . Lumbar post-laminectomy syndrome   . Lumbosacral neuritis   . Mononeuritis multiplex    BP 159/97  Pulse 93  Resp 14  Ht 5\' 10"  (1.778 m)  Wt 238 lb (107.956 kg)  BMI 34.15 kg/m2  SpO2 97%     Review of Systems  Musculoskeletal: Positive for myalgias, back pain, arthralgias and gait problem.  Neurological: Positive  for dizziness, tremors, weakness and numbness.  Psychiatric/Behavioral: Positive for confusion and dysphoric mood. The patient is nervous/anxious.   All other systems reviewed and are negative.       Objective:   Physical Exam Constitutional: He is oriented to person, place, and time. He appears well-developed and well-nourished.  HENT:  Head: Normocephalic.  Musculoskeletal: He exhibits tenderness.  Neurological: He is alert and oriented to person, place, and time.  Skin: Skin is warm and dry.  Psychiatric: He has a normal mood and affect.  Symmetric normal motor tone is noted throughout. Normal muscle bulk. Muscle testing reveals 5/5 muscle strength of the upper extremity, and 5/5 of the lower extremity. Full range of motion in upper and lower extremities. ROM of spine is not  restricted. Fine motor movements are normal in both hands.  Sensory is decreased to light touch and pinprick in both feet, right worse than left, Proprioception intact.  DTR in the upper and lower extremity are present and symmetric 2+. No clonus is noted.  Patient arises from chair without difficulty. Narrow based gait with normal arm swing bilateral .        Assessment & Plan:  1. Lumbar postlaminectomy syndrome with L4-5 and L5-S1 fusion. He has moderate to severe adjacent level disease at L3-L4 and chronic pain as a result.  2. Cervical stenosis with right C6 foraminal stenosis and chronic intermittent right upper extremity radiculopathy.  3. Mononeuritis multiplex associated with diabetes he has pain associated with this condition as well.  4. Chronic pain syndrome, insurance is not paying for his opana, changed his meds to Oxycodone 10mg  qid, after speaking with Dr. Doroteo Bradford about the patient's situation. He had some increased pain and is  back on Lyrica 75 mg 3 times a day .  Plan  Advised patient to walk as much as tolerated.  Follow up in 1 month.

## 2012-04-16 ENCOUNTER — Telehealth: Payer: Self-pay | Admitting: Physical Medicine & Rehabilitation

## 2012-04-16 NOTE — Telephone Encounter (Signed)
L/m for patient to call back

## 2012-04-16 NOTE — Telephone Encounter (Signed)
Still having trouble filling Rx.

## 2012-04-17 NOTE — Telephone Encounter (Signed)
Lm returning patient call.

## 2012-04-23 ENCOUNTER — Telehealth: Payer: Self-pay

## 2012-04-23 MED ORDER — OXYCODONE-ACETAMINOPHEN 7.5-325 MG PO TABS: 1.0000 | ORAL_TABLET | Freq: Three times a day (TID) | ORAL | Status: DC

## 2012-04-23 NOTE — Telephone Encounter (Signed)
Patient got his opana filled 04/17/12.  Now he needs his oxycodone refilled.  Please call.

## 2012-04-23 NOTE — Telephone Encounter (Signed)
Patient no longer needs his percocet 10/325 since he got his opana filled.  New script for percocet 7.5/325 was ordered.  His wife will come to pick up and she will bring the old script with her to switch.

## 2012-05-14 ENCOUNTER — Other Ambulatory Visit: Payer: Self-pay | Admitting: Physical Medicine & Rehabilitation

## 2012-05-14 ENCOUNTER — Encounter: Payer: Self-pay | Admitting: Physical Medicine and Rehabilitation

## 2012-05-14 ENCOUNTER — Encounter
Payer: Medicaid Other | Attending: Physical Medicine and Rehabilitation | Admitting: Physical Medicine and Rehabilitation

## 2012-05-14 VITALS — BP 123/78 | HR 78 | Resp 14 | Ht 70.0 in | Wt 236.2 lb

## 2012-05-14 DIAGNOSIS — Z981 Arthrodesis status: Secondary | ICD-10-CM | POA: Insufficient documentation

## 2012-05-14 DIAGNOSIS — E119 Type 2 diabetes mellitus without complications: Secondary | ICD-10-CM | POA: Insufficient documentation

## 2012-05-14 DIAGNOSIS — G587 Mononeuritis multiplex: Secondary | ICD-10-CM | POA: Insufficient documentation

## 2012-05-14 DIAGNOSIS — R209 Unspecified disturbances of skin sensation: Secondary | ICD-10-CM | POA: Insufficient documentation

## 2012-05-14 DIAGNOSIS — M961 Postlaminectomy syndrome, not elsewhere classified: Secondary | ICD-10-CM | POA: Insufficient documentation

## 2012-05-14 DIAGNOSIS — M542 Cervicalgia: Secondary | ICD-10-CM

## 2012-05-14 DIAGNOSIS — M545 Low back pain, unspecified: Secondary | ICD-10-CM | POA: Insufficient documentation

## 2012-05-14 DIAGNOSIS — M4802 Spinal stenosis, cervical region: Secondary | ICD-10-CM | POA: Insufficient documentation

## 2012-05-14 DIAGNOSIS — I10 Essential (primary) hypertension: Secondary | ICD-10-CM | POA: Insufficient documentation

## 2012-05-14 DIAGNOSIS — M79609 Pain in unspecified limb: Secondary | ICD-10-CM | POA: Insufficient documentation

## 2012-05-14 DIAGNOSIS — G894 Chronic pain syndrome: Secondary | ICD-10-CM | POA: Insufficient documentation

## 2012-05-14 MED ORDER — OXYCODONE-ACETAMINOPHEN 7.5-325 MG PO TABS: 1.0000 | ORAL_TABLET | Freq: Three times a day (TID) | ORAL | Status: DC

## 2012-05-14 MED ORDER — OXYMORPHONE HCL ER 20 MG PO TB12: 20.0000 mg | ORAL_TABLET | Freq: Two times a day (BID) | ORAL | Status: DC

## 2012-05-14 NOTE — Progress Notes (Signed)
Subjective:    Patient ID: Anthony Moran, male    DOB: [birth year], 61 y.o.   MRN: [id number]  HPI The patient complains about chronic pain in his feet bilateral. The patient also complains about numbness and tingling in feet and lower legs bilateral.He also complains about LBP, radiating into his right LE. Patient has a Hx of PSF L4-5, L5-S1 in early 2000s. The patient states, that he talked to his wife , whether she thinks he should see somebody for his irritability, but she stated, that he usually is coping well with his pain. The patient reports, that his insurance is paying for the Opana again, and he has more relief since he is on  this medication again.   Pain Inventory Average Pain 8 Pain Right Now 7 My pain is sharp, dull, stabbing, tingling and aching  In the last 24 hours, has pain interfered with the following? General activity 3 Relation with others 6 Enjoyment of life 3 What TIME of day is your pain at its worst? morning Sleep (in general) Poor  Pain is worse with: walking, bending, sitting, inactivity and standing Pain improves with: rest, heat/ice and medication Relief from Meds: 5  Mobility walk without assistance how many minutes can you walk? 3-4 ability to climb steps?  no do you drive?  no transfers alone Do you have any goals in this area?  yes  Function disabled: date disabled  I need assistance with the following:  dressing, bathing, meal prep, household duties and shopping Do you have any goals in this area?  yes  Neuro/Psych weakness numbness tingling trouble walking spasms dizziness confusion depression loss of taste or smell  Prior Studies Any changes since last visit?  no  Physicians involved in your care Any changes since last visit?  no   Family History  Problem Relation Age of Onset  . Breast cancer Maternal Aunt   . Prostate cancer Father   . Hypertension Sister    History   Social History  . Marital Status: Married   Spouse Name: N/A    Number of Children: 3  . Years of Education: N/A   Occupational History  .     Social History Main Topics  . Smoking status: Never Smoker   . Smokeless tobacco: Never Used  . Alcohol Use: No     Comment: rare  . Drug Use: No  . Sexually Active: None   Other Topics Concern  . None   Social History Narrative  . None   Past Surgical History  Procedure Date  . Lumbar fusion     L 4-5, L5 si   Past Medical History  Diagnosis Date  . Hypertension   . Diabetes mellitus   . Anxiety   . Depression   . Arthritis   . Asthma   . Cervical spondylosis without myelopathy   . Spinal stenosis in cervical region   . Lumbar post-laminectomy syndrome   . Lumbosacral neuritis   . Mononeuritis multiplex    BP 123/78  Pulse 78  Resp 14  Ht 5\' 10"  (1.778 m)  Wt 236 lb 3.2 oz (107.14 kg)  BMI 33.89 kg/m2  SpO2 93%    Review of Systems  Constitutional: Positive for diaphoresis, appetite change and unexpected weight change.  HENT: Positive for neck pain.   Respiratory: Positive for apnea and wheezing.   Cardiovascular: Positive for leg swelling.  Gastrointestinal: Positive for constipation.  Musculoskeletal: Positive for back pain and gait problem.  Neurological: Positive  for dizziness, weakness and numbness.       Tingling, spasms   Psychiatric/Behavioral: Positive for confusion and dysphoric mood.  All other systems reviewed and are negative.       Objective:   Physical Exam Constitutional: He is oriented to person, place, and time. He appears well-developed and well-nourished.  HENT:  Head: Normocephalic.  Musculoskeletal: He exhibits tenderness.  Neurological: He is alert and oriented to person, place, and time.  Skin: Skin is warm and dry.  Psychiatric: He has a normal mood and affect.  Symmetric normal motor tone is noted throughout. Normal muscle bulk. Muscle testing reveals 5/5 muscle strength of the upper extremity, and 5/5 of the lower  extremity. Full range of motion in upper and lower extremities. ROM of spine is restricted. Fine motor movements are normal in both hands.  Sensory is decreased to light touch and pinprick in both feet, right worse than left, Proprioception intact.  DTR in the upper and lower extremity are present and symmetric 2+. No clonus is noted.  Patient arises from chair without difficulty. Narrow based gait with normal arm swing bilateral .        Assessment & Plan:  1. Lumbar postlaminectomy syndrome with L4-5 and L5-S1 fusion. He has moderate to severe adjacent level disease at L3-L4 and chronic pain as a result.  2. Cervical stenosis with right C6 foraminal stenosis and chronic intermittent right upper extremity radiculopathy.  3. Mononeuritis multiplex associated with diabetes he has pain associated with this condition as well.  4. Chronic pain syndrome, insurance is paying for his opana,again . He had some increased pain and is back on Lyrica 75 mg 3 times a day .  Plan  Advised patient to walk as much as tolerated.  Follow up in 1 month.

## 2012-05-14 NOTE — Patient Instructions (Signed)
Continue with your walking program, as pain permits.

## 2012-06-13 ENCOUNTER — Encounter: Payer: Self-pay | Admitting: Physical Medicine and Rehabilitation

## 2012-06-13 ENCOUNTER — Encounter
Payer: Medicaid Other | Attending: Physical Medicine and Rehabilitation | Admitting: Physical Medicine and Rehabilitation

## 2012-06-13 VITALS — BP 156/84 | HR 84 | Resp 14 | Ht 70.5 in | Wt 238.8 lb

## 2012-06-13 DIAGNOSIS — G587 Mononeuritis multiplex: Secondary | ICD-10-CM | POA: Insufficient documentation

## 2012-06-13 DIAGNOSIS — E119 Type 2 diabetes mellitus without complications: Secondary | ICD-10-CM | POA: Insufficient documentation

## 2012-06-13 DIAGNOSIS — M961 Postlaminectomy syndrome, not elsewhere classified: Secondary | ICD-10-CM

## 2012-06-13 DIAGNOSIS — M4802 Spinal stenosis, cervical region: Secondary | ICD-10-CM | POA: Insufficient documentation

## 2012-06-13 DIAGNOSIS — G894 Chronic pain syndrome: Secondary | ICD-10-CM | POA: Insufficient documentation

## 2012-06-13 MED ORDER — OXYMORPHONE HCL ER 20 MG PO TB12: 20.0000 mg | ORAL_TABLET | Freq: Two times a day (BID) | ORAL | Status: DC

## 2012-06-13 NOTE — Patient Instructions (Signed)
Try to stay as active as tolerated 

## 2012-06-13 NOTE — Progress Notes (Signed)
Subjective:    Patient ID: Anthony Moran, male    DOB: 05-20-1951, 62 y.o.   MRN: [id number]  HPI The patient complains about chronic pain in his feet bilateral. The patient also complains about numbness and tingling in feet and lower legs bilateral.He also complains about LBP, radiating into his right LE. Patient has a Hx of PSF L4-5, L5-S1 in early 2000s. The patient states, that he talked to his wife , whether she thinks he should see somebody for his irritability, but she stated, that he usually is coping well with his pain. The patient reports, that his insurance is paying for the Opana again, and he has more relief since he is on this medication again.    Pain Inventory Average Pain 7 Pain Right Now 6 My pain is sharp, stabbing, tingling and aching  In the last 24 hours, has pain interfered with the following? General activity 9 Relation with others 9 Enjoyment of life 8 What TIME of day is your pain at its worst? morning Sleep (in general) Poor  Pain is worse with: walking, bending, sitting, inactivity and standing Pain improves with: rest, heat/ice and medication Relief from Meds: 3  Mobility walk without assistance how many minutes can you walk? 3-5 min ability to climb steps?  no do you drive?  no  Function disabled: date disabled  I need assistance with the following:  dressing, bathing, meal prep, household duties and shopping  Neuro/Psych weakness numbness tremor tingling trouble walking spasms dizziness confusion depression anxiety  Prior Studies Any changes since last visit?  no  Physicians involved in your care Any changes since last visit?  no   Family History  Problem Relation Age of Onset  . Breast cancer Maternal Aunt   . Prostate cancer Father   . Hypertension Sister    History   Social History  . Marital Status: Married    Spouse Name: N/A    Number of Children: 3  . Years of Education: N/A   Occupational History  .      Social History Main Topics  . Smoking status: Never Smoker   . Smokeless tobacco: Never Used  . Alcohol Use: No     Comment: rare  . Drug Use: No  . Sexually Active: None   Other Topics Concern  . None   Social History Narrative  . None   Past Surgical History  Procedure Date  . Lumbar fusion     L 4-5, L5 si   Past Medical History  Diagnosis Date  . Hypertension   . Diabetes mellitus   . Anxiety   . Depression   . Arthritis   . Asthma   . Cervical spondylosis without myelopathy   . Spinal stenosis in cervical region   . Lumbar post-laminectomy syndrome   . Lumbosacral neuritis   . Mononeuritis multiplex    Resp 14  Ht 5' 10.5" (1.791 m)  Wt 238 lb 12.8 oz (108.319 kg)  BMI 33.78 kg/m2  SpO2 98% BP 156/84  P 84    Review of Systems  Constitutional: Positive for diaphoresis, appetite change and unexpected weight change.  Respiratory: Positive for shortness of breath and wheezing.   Cardiovascular: Positive for leg swelling.  Gastrointestinal: Positive for constipation.  Musculoskeletal: Positive for back pain and gait problem.       Spasms  Neurological: Positive for dizziness, tremors, weakness and numbness.       Tingling  Psychiatric/Behavioral: Positive for dysphoric mood. The patient is  nervous/anxious.   All other systems reviewed and are negative.       Objective:   Physical Exam Constitutional: He is oriented to person, place, and time. He appears well-developed and well-nourished.  HENT:  Head: Normocephalic.  Musculoskeletal: He exhibits tenderness.  Neurological: He is alert and oriented to person, place, and time.  Skin: Skin is warm and dry.  Psychiatric: He has a normal mood and affect.  Symmetric normal motor tone is noted throughout. Normal muscle bulk. Muscle testing reveals 5/5 muscle strength of the upper extremity, and 5/5 of the lower extremity. Full range of motion in upper and lower extremities. ROM of spine is restricted. Fine  motor movements are normal in both hands.  Sensory is decreased to light touch and pinprick in both feet, right worse than left, Proprioception intact.  DTR in the upper and lower extremity are present and symmetric 2+. No clonus is noted.  Patient arises from chair without difficulty. Narrow based gait with normal arm swing bilateral .        Assessment & Plan:  1. Lumbar postlaminectomy syndrome with L4-5 and L5-S1 fusion. He has moderate to severe adjacent level disease at L3-L4 and chronic pain as a result.  2. Cervical stenosis with right C6 foraminal stenosis and chronic intermittent right upper extremity radiculopathy.  3. Mononeuritis multiplex associated with diabetes he has pain associated with this condition as well.  4. Chronic pain syndrome, insurance is paying for his opana,again . He had some increased pain and is back on Lyrica 75 mg 3 times a day .  Plan  Advised patient to walk as much as tolerated.  Follow up in 1 month.

## 2012-06-16 ENCOUNTER — Ambulatory Visit: Payer: Medicaid Other | Admitting: Physical Medicine and Rehabilitation

## 2012-07-10 ENCOUNTER — Encounter
Payer: Medicaid Other | Attending: Physical Medicine and Rehabilitation | Admitting: Physical Medicine and Rehabilitation

## 2012-07-10 ENCOUNTER — Encounter: Payer: Self-pay | Admitting: Physical Medicine and Rehabilitation

## 2012-07-10 VITALS — BP 120/76 | HR 75 | Resp 14 | Ht 70.5 in | Wt 235.0 lb

## 2012-07-10 DIAGNOSIS — M4802 Spinal stenosis, cervical region: Secondary | ICD-10-CM | POA: Insufficient documentation

## 2012-07-10 DIAGNOSIS — G587 Mononeuritis multiplex: Secondary | ICD-10-CM | POA: Insufficient documentation

## 2012-07-10 DIAGNOSIS — M545 Low back pain, unspecified: Secondary | ICD-10-CM

## 2012-07-10 DIAGNOSIS — Z981 Arthrodesis status: Secondary | ICD-10-CM | POA: Insufficient documentation

## 2012-07-10 DIAGNOSIS — M79609 Pain in unspecified limb: Secondary | ICD-10-CM | POA: Insufficient documentation

## 2012-07-10 DIAGNOSIS — G8929 Other chronic pain: Secondary | ICD-10-CM

## 2012-07-10 DIAGNOSIS — M961 Postlaminectomy syndrome, not elsewhere classified: Secondary | ICD-10-CM

## 2012-07-10 DIAGNOSIS — G894 Chronic pain syndrome: Secondary | ICD-10-CM | POA: Insufficient documentation

## 2012-07-10 MED ORDER — MELOXICAM 15 MG PO TABS: 15.0000 mg | ORAL_TABLET | Freq: Every day | ORAL | Status: DC

## 2012-07-10 MED ORDER — OXYMORPHONE HCL ER 20 MG PO TB12: 20.0000 mg | ORAL_TABLET | Freq: Two times a day (BID) | ORAL | Status: DC

## 2012-07-10 MED ORDER — METHOCARBAMOL 500 MG PO TABS: 500.0000 mg | ORAL_TABLET | Freq: Three times a day (TID) | ORAL | Status: DC

## 2012-07-10 MED ORDER — OXYCODONE-ACETAMINOPHEN 7.5-325 MG PO TABS: 1.0000 | ORAL_TABLET | Freq: Three times a day (TID) | ORAL | Status: DC

## 2012-07-10 NOTE — Patient Instructions (Signed)
Try to restart your walking when the weather gets a little warmer, and as pain permits.

## 2012-07-10 NOTE — Progress Notes (Signed)
Subjective:    Patient ID: Anthony Moran, male    DOB: 09/16/1950, 62 y.o.   MRN: [id number]  HPI The patient complains about chronic pain in his feet bilateral. The patient also complains about numbness and tingling in feet and lower legs bilateral.He also complains about LBP, radiating into his right LE. Patient has a Hx of PSF L4-5, L5-S1 in early 2000s. The patient states, that he talked to his wife , whether she thinks he should see somebody for his irritability, but she stated, that he usually is coping well with his pain. The patient reports, that his insurance is paying for the Opana again.  He states, that his pain has increased some, he feels more achy in his spine and joints, he also complains about increased neck/muscle pain.  Pain Inventory Average Pain 7 Pain Right Now 6 My pain is burning, tingling and aching  In the last 24 hours, has pain interfered with the following? General activity 9 Relation with others 8 Enjoyment of life 9 What TIME of day is your pain at its worst? morning Sleep (in general) Poor  Pain is worse with: walking, bending, sitting, inactivity and standing Pain improves with: rest, heat/ice and medication Relief from Meds: 3  Mobility walk without assistance how many minutes can you walk? 3-5 ability to climb steps?  no do you drive?  no transfers alone Do you have any goals in this area?  yes  Function disabled: date disabled  I need assistance with the following:  bathing, meal prep, household duties and shopping Do you have any goals in this area?  yes  Neuro/Psych weakness numbness tingling trouble walking spasms dizziness confusion depression anxiety loss of taste or smell  Prior Studies Any changes since last visit?  no  Physicians involved in your care Any changes since last visit?  no   Family History  Problem Relation Age of Onset  . Breast cancer Maternal Aunt   . Prostate cancer Father   . Hypertension Sister     History   Social History  . Marital Status: Married    Spouse Name: N/A    Number of Children: 3  . Years of Education: N/A   Occupational History  .     Social History Main Topics  . Smoking status: Never Smoker   . Smokeless tobacco: Never Used  . Alcohol Use: No     Comment: rare  . Drug Use: No  . Sexually Active: None   Other Topics Concern  . None   Social History Narrative  . None   Past Surgical History  Procedure Date  . Lumbar fusion     L 4-5, L5 si   Past Medical History  Diagnosis Date  . Hypertension   . Diabetes mellitus   . Anxiety   . Depression   . Arthritis   . Asthma   . Cervical spondylosis without myelopathy   . Spinal stenosis in cervical region   . Lumbar post-laminectomy syndrome   . Lumbosacral neuritis   . Mononeuritis multiplex    BP 120/76  Pulse 75  Resp 14  Ht 5' 10.5" (1.791 m)  Wt 235 lb (106.595 kg)  BMI 33.24 kg/m2  SpO2 97%    Review of Systems  Constitutional: Positive for diaphoresis, appetite change and unexpected weight change.  Respiratory: Positive for apnea and wheezing.   Cardiovascular: Positive for leg swelling.  Gastrointestinal: Positive for abdominal pain and constipation.  Musculoskeletal: Positive for back pain and  gait problem.  Neurological: Positive for dizziness, weakness and numbness.       Spasms, tingling  Psychiatric/Behavioral: Positive for confusion and dysphoric mood. The patient is nervous/anxious.   All other systems reviewed and are negative.       Objective:   Physical Exam Constitutional: He is oriented to person, place, and time. He appears well-developed and well-nourished.  HENT:  Head: Normocephalic.  Musculoskeletal: He exhibits tenderness.  Neurological: He is alert and oriented to person, place, and time.  Skin: Skin is warm and dry.  Psychiatric: He has a normal mood and affect.  Symmetric normal motor tone is noted throughout. Normal muscle bulk. Muscle testing  reveals 5/5 muscle strength of the upper extremity, and 5/5 of the lower extremity. Full range of motion in upper and lower extremities. ROM of spine is restricted. Fine motor movements are normal in both hands.  Sensory is decreased to light touch and pinprick in both feet, right worse than left, Proprioception intact.  DTR in the upper and lower extremity are present and symmetric 2+. No clonus is noted.  Patient arises from chair without difficulty. Narrow based gait with normal arm swing bilateral .        Assessment & Plan:  1. Lumbar postlaminectomy syndrome with L4-5 and L5-S1 fusion. He has moderate to severe adjacent level disease at L3-L4 and chronic pain as a result. Pain has increased some because of the cold, he feels achy in all his joints, prescribed Mobic. Advised patient to measure his BP at home and d/c this medication if his BP increases. 2. Cervical stenosis with right C6 foraminal stenosis and chronic intermittent right upper extremity radiculopathy. More pain in his neck, with muscle spasms/pain. Prescribed Robaxin prn. 3. Mononeuritis multiplex associated with diabetes he has pain associated with this condition as well.  4. Chronic pain syndrome, insurance is paying for his opana,again . He had some increased pain and is back on Lyrica 75 mg 3 times a day .  Plan  Advised patient to walk as much as tolerated.  Follow up in 1 month.

## 2012-08-08 ENCOUNTER — Encounter
Payer: Medicaid Other | Attending: Physical Medicine and Rehabilitation | Admitting: Physical Medicine and Rehabilitation

## 2012-08-08 ENCOUNTER — Encounter: Payer: Self-pay | Admitting: Physical Medicine and Rehabilitation

## 2012-08-08 VITALS — BP 162/90 | HR 66 | Resp 14 | Ht 70.0 in | Wt 239.0 lb

## 2012-08-08 DIAGNOSIS — M5412 Radiculopathy, cervical region: Secondary | ICD-10-CM | POA: Insufficient documentation

## 2012-08-08 DIAGNOSIS — E1149 Type 2 diabetes mellitus with other diabetic neurological complication: Secondary | ICD-10-CM | POA: Insufficient documentation

## 2012-08-08 DIAGNOSIS — Z981 Arthrodesis status: Secondary | ICD-10-CM | POA: Insufficient documentation

## 2012-08-08 DIAGNOSIS — M62838 Other muscle spasm: Secondary | ICD-10-CM | POA: Insufficient documentation

## 2012-08-08 DIAGNOSIS — M4802 Spinal stenosis, cervical region: Secondary | ICD-10-CM | POA: Insufficient documentation

## 2012-08-08 DIAGNOSIS — Z5181 Encounter for therapeutic drug level monitoring: Secondary | ICD-10-CM

## 2012-08-08 DIAGNOSIS — G587 Mononeuritis multiplex: Secondary | ICD-10-CM | POA: Insufficient documentation

## 2012-08-08 DIAGNOSIS — G894 Chronic pain syndrome: Secondary | ICD-10-CM

## 2012-08-08 DIAGNOSIS — M961 Postlaminectomy syndrome, not elsewhere classified: Secondary | ICD-10-CM | POA: Insufficient documentation

## 2012-08-08 MED ORDER — MELOXICAM 15 MG PO TABS: 15.0000 mg | ORAL_TABLET | Freq: Every day | ORAL | Status: DC

## 2012-08-08 MED ORDER — OXYMORPHONE HCL ER 20 MG PO TB12: 20.0000 mg | ORAL_TABLET | Freq: Two times a day (BID) | ORAL | Status: DC

## 2012-08-08 MED ORDER — OXYCODONE-ACETAMINOPHEN 7.5-325 MG PO TABS: 1.0000 | ORAL_TABLET | Freq: Three times a day (TID) | ORAL | Status: DC

## 2012-08-08 NOTE — Progress Notes (Signed)
Subjective:    Patient ID: Anthony Moran, male    DOB: [birth year], 62 y.o.   MRN: [id number]  HPI The patient complains about chronic pain in his feet bilateral. The patient also complains about numbness and tingling in feet and lower legs bilateral.He also complains about LBP, radiating into his right LE. Patient has a Hx of PSF L4-5, L5-S1 in early 2000s. The patient states, that he talked to his wife , whether she thinks he should see somebody for his irritability, but she stated, that he usually is coping well with his pain. The patient reports, that his insurance is paying for the Opana again.  He states, that his pain has increased some, he feels more achy in his spine and joints, he also complains about increased neck/muscle pain. This has improved immensely with the Robaxin and Mobic I prescribed at his last visit.   Pain Inventory Average Pain 7 Pain Right Now 7 My pain is sharp, dull, stabbing, tingling and aching  In the last 24 hours, has pain interfered with the following? General activity 7 Relation with others 3 Enjoyment of life 2 What TIME of day is your pain at its worst? morning Sleep (in general) Poor  Pain is worse with: walking, bending, sitting, inactivity and standing Pain improves with: rest, heat/ice and medication Relief from Meds: 4  Mobility walk without assistance how many minutes can you walk? 3-4 ability to climb steps?  no do you drive?  no  Function disabled: date disabled see chart I need assistance with the following:  dressing, bathing, meal prep, household duties and shopping  Neuro/Psych weakness numbness tingling trouble walking spasms dizziness confusion depression anxiety  Prior Studies Any changes since last visit?  no  Physicians involved in your care Any changes since last visit?  no   Family History  Problem Relation Age of Onset  . Breast cancer Maternal Aunt   . Prostate cancer Father   . Hypertension Sister     History   Social History  . Marital Status: Married    Spouse Name: N/A    Number of Children: 3  . Years of Education: N/A   Occupational History  .     Social History Main Topics  . Smoking status: Never Smoker   . Smokeless tobacco: Never Used  . Alcohol Use: No     Comment: rare  . Drug Use: No  . Sexually Active: None   Other Topics Concern  . None   Social History Narrative  . None   Past Surgical History  Procedure Laterality Date  . Lumbar fusion      L 4-5, L5 si   Past Medical History  Diagnosis Date  . Hypertension   . Diabetes mellitus   . Anxiety   . Depression   . Arthritis   . Asthma   . Cervical spondylosis without myelopathy   . Spinal stenosis in cervical region   . Lumbar post-laminectomy syndrome   . Lumbosacral neuritis   . Mononeuritis multiplex    BP 162/90  Pulse 66  Resp 14  Ht 5\' 10"  (1.778 m)  Wt 239 lb (108.41 kg)  BMI 34.29 kg/m2  SpO2 100%   Review of Systems  Constitutional: Positive for diaphoresis, appetite change and unexpected weight change.  Respiratory: Positive for wheezing.   Gastrointestinal: Positive for nausea, abdominal pain and constipation.  Musculoskeletal: Positive for back pain and gait problem.       Spasms  Neurological: Positive for  dizziness, weakness and numbness.       Tingling  Psychiatric/Behavioral: Positive for confusion and dysphoric mood. The patient is nervous/anxious.   All other systems reviewed and are negative.       Objective:   Physical Exam Constitutional: He is oriented to person, place, and time. He appears well-developed and well-nourished.  HENT:  Head: Normocephalic.  Musculoskeletal: He exhibits tenderness.  Neurological: He is alert and oriented to person, place, and time.  Skin: Skin is warm and dry.  Psychiatric: He has a normal mood and affect.  Symmetric normal motor tone is noted throughout. Normal muscle bulk. Muscle testing reveals 5/5 muscle strength of  the upper extremity, and 5/5 of the lower extremity. Full range of motion in upper and lower extremities. ROM of spine is restricted. Fine motor movements are normal in both hands.  Sensory is decreased to light touch and pinprick in both feet, right worse than left, Proprioception intact.  DTR in the upper and lower extremity are present and symmetric 2+. No clonus is noted.  Patient arises from chair without difficulty. Narrow based gait with normal arm swing bilateral .        Assessment & Plan:  1. Lumbar postlaminectomy syndrome with L4-5 and L5-S1 fusion. He has moderate to severe adjacent level disease at L3-L4 and chronic pain as a result. Pain has increased some because of the cold, he feels achy in all his joints, prescribed Mobic. Advised patient to measure his BP at home and d/c this medication if his BP increases.  2. Cervical stenosis with right C6 foraminal stenosis and chronic intermittent right upper extremity radiculopathy. More pain in his neck, with muscle spasms/pain. Prescribed Robaxin prn.  3. Mononeuritis multiplex associated with diabetes he has pain associated with this condition as well.  4. Chronic pain syndrome, insurance is paying for his opana,again . He had some increased pain and is back on Lyrica 75 mg 3 times a day .  Plan  Advised patient to walk as much as tolerated.  Follow up in 1 month.

## 2012-08-08 NOTE — Patient Instructions (Signed)
Continue with your walking program. 

## 2012-09-04 ENCOUNTER — Encounter: Payer: Self-pay | Admitting: Physical Medicine and Rehabilitation

## 2012-09-04 ENCOUNTER — Encounter
Payer: Medicaid Other | Attending: Physical Medicine and Rehabilitation | Admitting: Physical Medicine and Rehabilitation

## 2012-09-04 VITALS — BP 169/100 | HR 85 | Resp 14 | Ht 70.0 in | Wt 236.0 lb

## 2012-09-04 DIAGNOSIS — M545 Low back pain, unspecified: Secondary | ICD-10-CM | POA: Insufficient documentation

## 2012-09-04 DIAGNOSIS — M79609 Pain in unspecified limb: Secondary | ICD-10-CM | POA: Insufficient documentation

## 2012-09-04 DIAGNOSIS — G8929 Other chronic pain: Secondary | ICD-10-CM

## 2012-09-04 DIAGNOSIS — Z981 Arthrodesis status: Secondary | ICD-10-CM | POA: Insufficient documentation

## 2012-09-04 DIAGNOSIS — E1149 Type 2 diabetes mellitus with other diabetic neurological complication: Secondary | ICD-10-CM | POA: Insufficient documentation

## 2012-09-04 DIAGNOSIS — M4802 Spinal stenosis, cervical region: Secondary | ICD-10-CM | POA: Insufficient documentation

## 2012-09-04 DIAGNOSIS — G894 Chronic pain syndrome: Secondary | ICD-10-CM | POA: Insufficient documentation

## 2012-09-04 DIAGNOSIS — G587 Mononeuritis multiplex: Secondary | ICD-10-CM | POA: Insufficient documentation

## 2012-09-04 DIAGNOSIS — I10 Essential (primary) hypertension: Secondary | ICD-10-CM | POA: Insufficient documentation

## 2012-09-04 DIAGNOSIS — R209 Unspecified disturbances of skin sensation: Secondary | ICD-10-CM | POA: Insufficient documentation

## 2012-09-04 DIAGNOSIS — E119 Type 2 diabetes mellitus without complications: Secondary | ICD-10-CM | POA: Insufficient documentation

## 2012-09-04 DIAGNOSIS — M961 Postlaminectomy syndrome, not elsewhere classified: Secondary | ICD-10-CM

## 2012-09-04 MED ORDER — OXYMORPHONE HCL ER 20 MG PO TB12: 20.0000 mg | ORAL_TABLET | Freq: Two times a day (BID) | ORAL | Status: DC

## 2012-09-04 MED ORDER — OXYCODONE-ACETAMINOPHEN 7.5-325 MG PO TABS: 1.0000 | ORAL_TABLET | Freq: Three times a day (TID) | ORAL | Status: DC

## 2012-09-04 NOTE — Patient Instructions (Signed)
Continue with your exercises. 

## 2012-09-04 NOTE — Progress Notes (Signed)
Subjective:    Patient ID: Anthony Moran, male    DOB: Oct 16, 1950, 62 y.o.   MRN: [id number]  HPI The patient complains about chronic pain in his feet bilateral. The patient also complains about numbness and tingling in feet and lower legs bilateral.He also complains about LBP, radiating into his right LE. Patient has a Hx of PSF L4-5, L5-S1 in early 2000s. The patient states, that he talked to his wife , whether she thinks he should see somebody for his irritability, but she stated, that he usually is coping well with his pain. The patient reports, that his insurance is paying for the Opana again.  He states, that he is doing considerably well today. He states, that he is out of his BP medication for 2 month now, but he has an appointment with a new PCP in 5 days.  Pain Inventory Average Pain 6 Pain Right Now 7 My pain is sharp, dull, stabbing, tingling and aching  In the last 24 hours, has pain interfered with the following? General activity 9 Relation with others 8 Enjoyment of life 9 What TIME of day is your pain at its worst? morning Sleep (in general) Poor  Pain is worse with: walking, bending, sitting, inactivity and standing Pain improves with: rest, heat/ice and medication Relief from Meds: 4  Mobility walk without assistance how many minutes can you walk? 1-3 ability to climb steps?  no do you drive?  no transfers alone Do you have any goals in this area?  yes  Function disabled: date disabled see chart I need assistance with the following:  dressing, bathing, meal prep, household duties and shopping Do you have any goals in this area?  yes  Neuro/Psych weakness numbness tingling trouble walking spasms dizziness confusion depression anxiety loss of taste or smell  Prior Studies Any changes since last visit?  no  Physicians involved in your care Any changes since last visit?  no   Family History  Problem Relation Age of Onset  . Breast cancer  Maternal Aunt   . Prostate cancer Father   . Hypertension Sister    History   Social History  . Marital Status: Married    Spouse Name: N/A    Number of Children: 3  . Years of Education: N/A   Occupational History  .     Social History Main Topics  . Smoking status: Never Smoker   . Smokeless tobacco: Never Used  . Alcohol Use: No     Comment: rare  . Drug Use: No  . Sexually Active: None   Other Topics Concern  . None   Social History Narrative  . None   Past Surgical History  Procedure Laterality Date  . Lumbar fusion      L 4-5, L5 si   Past Medical History  Diagnosis Date  . Hypertension   . Diabetes mellitus   . Anxiety   . Depression   . Arthritis   . Asthma   . Cervical spondylosis without myelopathy   . Spinal stenosis in cervical region   . Lumbar post-laminectomy syndrome   . Lumbosacral neuritis   . Mononeuritis multiplex    BP 169/100  Pulse 85  Resp 14  Ht 5\' 10"  (1.778 m)  Wt 236 lb (107.049 kg)  BMI 33.86 kg/m2  SpO2 98%     Review of Systems  Constitutional: Positive for diaphoresis, appetite change and unexpected weight change.  Respiratory: Positive for wheezing.   Gastrointestinal: Positive for nausea  and constipation.  Musculoskeletal: Positive for back pain and gait problem.  Neurological: Positive for dizziness, weakness and numbness.  Psychiatric/Behavioral: Positive for confusion and dysphoric mood. The patient is nervous/anxious.   All other systems reviewed and are negative.       Objective:   Physical Exam Constitutional: He is oriented to person, place, and time. He appears well-developed and well-nourished.  HENT:  Head: Normocephalic.  Musculoskeletal: He exhibits tenderness.  Neurological: He is alert and oriented to person, place, and time.  Skin: Skin is warm and dry.  Psychiatric: He has a normal mood and affect.  Symmetric normal motor tone is noted throughout. Normal muscle bulk. Muscle testing  reveals 5/5 muscle strength of the upper extremity, and 5/5 of the lower extremity. Full range of motion in upper and lower extremities. ROM of spine is restricted. Fine motor movements are normal in both hands.  Sensory is decreased to light touch and pinprick in both feet, right worse than left, Proprioception intact.  DTR in the upper and lower extremity are present and symmetric 2+. No clonus is noted.  Patient arises from chair without difficulty. Narrow based gait with normal arm swing bilateral .        Assessment & Plan:  1. Lumbar postlaminectomy syndrome with L4-5 and L5-S1 fusion. He has moderate to severe adjacent level disease at L3-L4 and chronic pain as a result. Pain has increased some because of the cold, he feels achy in all his joints, prescribed Mobic. Advised patient to measure his BP at home and d/c this medication if his BP increases.  2. Cervical stenosis with right C6 foraminal stenosis and chronic intermittent right upper extremity radiculopathy. More pain in his neck, with muscle spasms/pain. Prescribed Robaxin prn.  3. Mononeuritis multiplex associated with diabetes he has pain associated with this condition as well.  4. Chronic pain syndrome, insurance is paying for his opana,again . He is  on Lyrica 75 mg 3 times a day . He is out of his BP medication for 2 month now, but he has an appointment with a new PCP in 5 days, I advised him to go to the ED, if he has Symptoms from his HTN, like headaches, blurred vision, dizziness etc.  Plan  Advised patient to walk as much as tolerated.  Follow up in 1 month.

## 2012-09-18 ENCOUNTER — Telehealth: Payer: Self-pay

## 2012-09-18 NOTE — Telephone Encounter (Signed)
Patient says he lost his hard copy script.  He says he dropped it out of his folder some how.  Is it ok to refill?

## 2012-09-18 NOTE — Telephone Encounter (Signed)
Left message for patient to call office regarding his medication request to get clarification on what medication and what happen to his scripts.

## 2012-09-18 NOTE — Telephone Encounter (Signed)
Patient called and says he lost his RX and needs a refill.  Please advise.

## 2012-09-19 NOTE — Telephone Encounter (Signed)
I spoke with Dr. Doroteo Bradford, please tell patient Dr. Doroteo Bradford said it is stated in his contract that he will not get a second prescription when he looses his first. Dr. Doroteo Bradford would only make an exception when the patient could provide a police report that his meds were stolen.  Per Dr. Doroteo Bradford we can prescribe Tramadol 50mg  q 6 hrs,# 120 prn pain to help some with his withdrawal. The patient already has phenergan 25mg  q 6hrs, prn nausea. We could refill this meds as needed.  Please also tell patient if his withdrawal Sx get too severe he should go to the ED.

## 2012-09-19 NOTE — Telephone Encounter (Signed)
Per Dr Wynn Banker patient can get tramadol and phenergan for withdrawal symptoms.  If he can bring a police report stating his script has been stolen then we can re-write but will not re-write otherwise.

## 2012-09-19 NOTE — Telephone Encounter (Signed)
Informed patient we would not re-write script and offered him tramadol and phenergan.  He refused tramadol because it gives him nightmares.  He did not even want me to send the script to the pharmacy.  Advised him he may have to go to the emergency room for withdrawal.  He does not have a police report either.  Patient was very upset with this discission.

## 2012-09-25 ENCOUNTER — Other Ambulatory Visit: Payer: Self-pay | Admitting: Physical Medicine and Rehabilitation

## 2012-09-29 NOTE — Telephone Encounter (Signed)
Left message to see when patient took lyrica last since he is requesting refills and it has not been filled since July.

## 2012-10-03 ENCOUNTER — Encounter
Payer: Medicaid Other | Attending: Physical Medicine and Rehabilitation | Admitting: Physical Medicine and Rehabilitation

## 2012-10-03 ENCOUNTER — Encounter: Payer: Self-pay | Admitting: Physical Medicine and Rehabilitation

## 2012-10-03 VITALS — BP 182/101 | HR 96 | Resp 14 | Ht 70.0 in | Wt 230.0 lb

## 2012-10-03 DIAGNOSIS — M961 Postlaminectomy syndrome, not elsewhere classified: Secondary | ICD-10-CM

## 2012-10-03 DIAGNOSIS — M4802 Spinal stenosis, cervical region: Secondary | ICD-10-CM | POA: Insufficient documentation

## 2012-10-03 DIAGNOSIS — M79609 Pain in unspecified limb: Secondary | ICD-10-CM | POA: Insufficient documentation

## 2012-10-03 DIAGNOSIS — G894 Chronic pain syndrome: Secondary | ICD-10-CM | POA: Insufficient documentation

## 2012-10-03 DIAGNOSIS — G587 Mononeuritis multiplex: Secondary | ICD-10-CM | POA: Insufficient documentation

## 2012-10-03 DIAGNOSIS — Z981 Arthrodesis status: Secondary | ICD-10-CM | POA: Insufficient documentation

## 2012-10-03 MED ORDER — OXYMORPHONE HCL ER 20 MG PO TB12: 20.0000 mg | ORAL_TABLET | Freq: Two times a day (BID) | ORAL | Status: DC

## 2012-10-03 MED ORDER — DULOXETINE HCL 60 MG PO CPEP: ORAL_CAPSULE | ORAL | Status: DC

## 2012-10-03 MED ORDER — OXYCODONE-ACETAMINOPHEN 7.5-325 MG PO TABS: 1.0000 | ORAL_TABLET | Freq: Three times a day (TID) | ORAL | Status: DC

## 2012-10-03 NOTE — Progress Notes (Signed)
Subjective:    Patient ID: Anthony Moran, male    DOB: 1950/11/26, 62 y.o.   MRN: [id number]  HPI The patient complains about chronic pain in his feet bilateral. The patient also complains about numbness and tingling in feet and lower legs bilateral.He also complains about LBP, radiating into his right LE. Patient has a Hx of PSF L4-5, L5-S1 in early 2000s. The patient states, that his symptoms were much worse last month, because he lost the prescription for his pain medications.  Pain Inventory Average Pain 10 Pain Right Now 9 My pain is sharp, burning, dull, stabbing, tingling and aching  In the last 24 hours, has pain interfered with the following? General activity 10 Relation with others 10 Enjoyment of life 10 What TIME of day is your pain at its worst? morning Sleep (in general) Poor  Pain is worse with: walking, bending, sitting, inactivity and standing Pain improves with: rest and medication Relief from Meds: 0  Mobility walk without assistance ability to climb steps?  no do you drive?  no transfers alone Do you have any goals in this area?  yes  Function disabled: date disabled . I need assistance with the following:  dressing, bathing, toileting, meal prep, household duties and shopping Do you have any goals in this area?  yes  Neuro/Psych weakness numbness tremor tingling trouble walking spasms dizziness confusion depression anxiety  Prior Studies Any changes since last visit?  no  Physicians involved in your care Dr Parke Simmers, Dr Lovell Sheehan   Family History  Problem Relation Age of Onset  . Breast cancer Maternal Aunt   . Prostate cancer Father   . Hypertension Sister    History   Social History  . Marital Status: Married    Spouse Name: N/A    Number of Children: 3  . Years of Education: N/A   Occupational History  .     Social History Main Topics  . Smoking status: Never Smoker   . Smokeless tobacco: Never Used  . Alcohol Use: No   Comment: rare  . Drug Use: No  . Sexually Active: None   Other Topics Concern  . None   Social History Narrative  . None   Past Surgical History  Procedure Laterality Date  . Lumbar fusion      L 4-5, L5 si   Past Medical History  Diagnosis Date  . Hypertension   . Diabetes mellitus   . Anxiety   . Depression   . Arthritis   . Asthma   . Cervical spondylosis without myelopathy   . Spinal stenosis in cervical region   . Lumbar post-laminectomy syndrome   . Lumbosacral neuritis   . Mononeuritis multiplex    BP 182/101  Pulse 96  Resp 14  Ht 5\' 10"  (1.778 m)  Wt 230 lb (104.327 kg)  BMI 33 kg/m2  SpO2 96%     Review of Systems  Constitutional: Positive for diaphoresis, appetite change and unexpected weight change.  HENT: Positive for neck pain.   Respiratory: Positive for apnea, shortness of breath and wheezing.   Gastrointestinal: Positive for abdominal pain and constipation.  Musculoskeletal: Positive for back pain and gait problem.  Neurological: Positive for dizziness, tremors, weakness and numbness.  Psychiatric/Behavioral: Positive for confusion and dysphoric mood. The patient is nervous/anxious.   All other systems reviewed and are negative.       Objective:   Physical Exam Constitutional: He is oriented to person, place, and time. He appears well-developed  and well-nourished.  HENT:  Head: Normocephalic.  Musculoskeletal: He exhibits tenderness.  Neurological: He is alert and oriented to person, place, and time.  Skin: Skin is warm and dry.  Psychiatric: He has a normal mood and affect.  Symmetric normal motor tone is noted throughout. Normal muscle bulk. Muscle testing reveals 5/5 muscle strength of the upper extremity, and 5/5 of the lower extremity. Full range of motion in upper and lower extremities. ROM of spine is restricted. Fine motor movements are normal in both hands.  Sensory is decreased to light touch and pinprick in both feet, right  worse than left, Proprioception intact.  DTR in the upper and lower extremity are present and symmetric 2+. No clonus is noted.  Patient arises from chair without difficulty. Narrow based gait with normal arm swing bilateral .        Assessment & Plan:  1. Lumbar postlaminectomy syndrome with L4-5 and L5-S1 fusion. He has moderate to severe adjacent level disease at L3-L4 and chronic pain as a result. Pain has increased because he lost his prescription for his pain medications last month, and Dr. Doroteo Bradford did not give him another prescription, as it is the rule in the patients contract with [place].  2. Cervical stenosis with right C6 foraminal stenosis and chronic intermittent right upper extremity radiculopathy. More pain in his neck, with muscle spasms/pain. Prescribed Robaxin prn.  3. Mononeuritis multiplex associated with diabetes he has pain associated with this condition as well.  4. Chronic pain syndrome, insurance is paying for his opana,again . He is on Lyrica 75 mg 3 times a day .  He reports that he is seeing a PCP for his HTN now.  Plan  Refilled his Opana 20mg  bid, and Oxycodone 7.5mg  tid, also refilled hid Cymbalta. Advised patient to walk as much as tolerated.  Follow up in 1 month.

## 2012-10-03 NOTE — Patient Instructions (Signed)
Try restart your walking and exercise program

## 2012-10-31 ENCOUNTER — Encounter: Payer: Self-pay | Admitting: Physical Medicine and Rehabilitation

## 2012-10-31 ENCOUNTER — Encounter
Payer: Medicaid Other | Attending: Physical Medicine and Rehabilitation | Admitting: Physical Medicine and Rehabilitation

## 2012-10-31 VITALS — BP 130/76 | HR 71 | Resp 16 | Ht 70.0 in | Wt 225.0 lb

## 2012-10-31 DIAGNOSIS — G894 Chronic pain syndrome: Secondary | ICD-10-CM | POA: Insufficient documentation

## 2012-10-31 DIAGNOSIS — M961 Postlaminectomy syndrome, not elsewhere classified: Secondary | ICD-10-CM | POA: Insufficient documentation

## 2012-10-31 DIAGNOSIS — M5412 Radiculopathy, cervical region: Secondary | ICD-10-CM | POA: Insufficient documentation

## 2012-10-31 DIAGNOSIS — G8929 Other chronic pain: Secondary | ICD-10-CM | POA: Insufficient documentation

## 2012-10-31 DIAGNOSIS — M62838 Other muscle spasm: Secondary | ICD-10-CM | POA: Insufficient documentation

## 2012-10-31 DIAGNOSIS — M4802 Spinal stenosis, cervical region: Secondary | ICD-10-CM | POA: Insufficient documentation

## 2012-10-31 MED ORDER — OXYCODONE-ACETAMINOPHEN 7.5-325 MG PO TABS: 1.0000 | ORAL_TABLET | Freq: Three times a day (TID) | ORAL | Status: DC

## 2012-10-31 MED ORDER — OXYMORPHONE HCL ER 20 MG PO TB12: 20.0000 mg | ORAL_TABLET | Freq: Two times a day (BID) | ORAL | Status: DC

## 2012-10-31 NOTE — Patient Instructions (Signed)
Continue with your walking program. 

## 2012-10-31 NOTE — Progress Notes (Signed)
Subjective:    Patient ID: Anthony Moran, male    DOB: 31-Aug-1950, 62 y.o.   MRN: [id number]  HPI The patient complains about chronic pain in his feet bilateral. The patient also complains about numbness and tingling in feet and lower legs bilateral.He also complains about LBP, radiating into his right LE. Patient has a Hx of PSF L4-5, L5-S1 in early 2000s. The patient states, that his symptoms have been stable. He reports that he is taking Metformin now, which makes him a little nauseated.  Pain Inventory Average Pain 7 Pain Right Now 7 My pain is sharp, dull, stabbing, tingling and aching  In the last 24 hours, has pain interfered with the following? General activity 5 Relation with others 5 Enjoyment of life 2 What TIME of day is your pain at its worst? morning Sleep (in general) Poor  Pain is worse with: walking, bending, sitting, inactivity and standing Pain improves with: rest, heat/ice and medication Relief from Meds: na  Mobility walk without assistance how many minutes can you walk? 15-25 ability to climb steps?  no do you drive?  no transfers alone Do you have any goals in this area?  yes  Function disabled: date disabled na I need assistance with the following:  bathing, meal prep, household duties and shopping Do you have any goals in this area?  yes  Neuro/Psych weakness numbness tingling trouble walking spasms dizziness confusion depression anxiety loss of taste or smell  Prior Studies Any changes since last visit?  no  Physicians involved in your care Any changes since last visit?  no   Family History  Problem Relation Age of Onset  . Breast cancer Maternal Aunt   . Prostate cancer Father   . Hypertension Sister    History   Social History  . Marital Status: Married    Spouse Name: N/A    Number of Children: 3  . Years of Education: N/A   Occupational History  .     Social History Main Topics  . Smoking status: Never Smoker    . Smokeless tobacco: Never Used  . Alcohol Use: No     Comment: rare  . Drug Use: No  . Sexually Active: None   Other Topics Concern  . None   Social History Narrative  . None   Past Surgical History  Procedure Laterality Date  . Lumbar fusion      L 4-5, L5 si   Past Medical History  Diagnosis Date  . Hypertension   . Diabetes mellitus   . Anxiety   . Depression   . Arthritis   . Asthma   . Cervical spondylosis without myelopathy   . Spinal stenosis in cervical region   . Lumbar post-laminectomy syndrome   . Lumbosacral neuritis   . Mononeuritis multiplex    BP 130/76  Pulse 71  Resp 16  Ht 5\' 10"  (1.778 m)  Wt 225 lb (102.059 kg)  BMI 32.28 kg/m2  SpO2 97%     Review of Systems  Constitutional: Positive for diaphoresis, appetite change and unexpected weight change.  Cardiovascular: Positive for leg swelling.  Gastrointestinal: Positive for nausea, abdominal pain and constipation.  Musculoskeletal: Positive for gait problem.  Neurological: Positive for dizziness, weakness and numbness.  Psychiatric/Behavioral: Positive for dysphoric mood and agitation.  All other systems reviewed and are negative.       Objective:   Physical Exam Constitutional: He is oriented to person, place, and time. He appears well-developed and well-nourished.  HENT:  Head: Normocephalic.  Musculoskeletal: He exhibits tenderness.  Neurological: He is alert and oriented to person, place, and time.  Skin: Skin is warm and dry.  Psychiatric: He has a normal mood and affect.  Symmetric normal motor tone is noted throughout. Normal muscle bulk. Muscle testing reveals 5/5 muscle strength of the upper extremity, and 5/5 of the lower extremity. Full range of motion in upper and lower extremities. ROM of spine is restricted. Fine motor movements are normal in both hands.  Sensory is decreased to light touch and pinprick in both feet, right worse than left, Proprioception intact.  DTR  in the upper and lower extremity are present and symmetric 2+. No clonus is noted.  Patient arises from chair without difficulty. Narrow based gait with normal arm swing bilateral .        Assessment & Plan:  1. Lumbar postlaminectomy syndrome with L4-5 and L5-S1 fusion. He has moderate to severe adjacent level disease at L3-L4 and chronic pain as a result. Pain had increased, when he lost his prescription for his pain medications , and Dr. Doroteo Bradford did not give him another prescription, as it is the rule in the patients contract with [place].  2. Cervical stenosis with right C6 foraminal stenosis and chronic intermittent right upper extremity radiculopathy. More pain in his neck, with muscle spasms/pain. Prescribed Robaxin prn. At last visit, this has helped some.  3. Mononeuritis multiplex associated with diabetes he has pain associated with this condition as well.  4. Chronic pain syndrome, insurance is paying for his opana,again . He is on Lyrica 75 mg 3 times a day .  He reports that he is seeing a PCP for his HTN now.  Plan  Refilled his Opana 20mg  bid, and Oxycodone 7.5mg  tid.  Advised patient to walk as much as tolerated.  Follow up in 1 month.

## 2012-12-01 ENCOUNTER — Encounter
Payer: Medicaid Other | Attending: Physical Medicine and Rehabilitation | Admitting: Physical Medicine and Rehabilitation

## 2012-12-01 ENCOUNTER — Encounter: Payer: Self-pay | Admitting: Physical Medicine and Rehabilitation

## 2012-12-01 VITALS — BP 129/81 | HR 64 | Resp 14 | Ht 70.0 in | Wt 227.0 lb

## 2012-12-01 DIAGNOSIS — M4802 Spinal stenosis, cervical region: Secondary | ICD-10-CM | POA: Insufficient documentation

## 2012-12-01 DIAGNOSIS — R209 Unspecified disturbances of skin sensation: Secondary | ICD-10-CM | POA: Insufficient documentation

## 2012-12-01 DIAGNOSIS — I10 Essential (primary) hypertension: Secondary | ICD-10-CM | POA: Insufficient documentation

## 2012-12-01 DIAGNOSIS — M545 Low back pain, unspecified: Secondary | ICD-10-CM | POA: Insufficient documentation

## 2012-12-01 DIAGNOSIS — E119 Type 2 diabetes mellitus without complications: Secondary | ICD-10-CM | POA: Insufficient documentation

## 2012-12-01 DIAGNOSIS — M79609 Pain in unspecified limb: Secondary | ICD-10-CM | POA: Insufficient documentation

## 2012-12-01 DIAGNOSIS — Z981 Arthrodesis status: Secondary | ICD-10-CM | POA: Insufficient documentation

## 2012-12-01 DIAGNOSIS — G587 Mononeuritis multiplex: Secondary | ICD-10-CM | POA: Insufficient documentation

## 2012-12-01 DIAGNOSIS — M961 Postlaminectomy syndrome, not elsewhere classified: Secondary | ICD-10-CM | POA: Insufficient documentation

## 2012-12-01 DIAGNOSIS — G894 Chronic pain syndrome: Secondary | ICD-10-CM | POA: Insufficient documentation

## 2012-12-01 DIAGNOSIS — Z79899 Other long term (current) drug therapy: Secondary | ICD-10-CM | POA: Insufficient documentation

## 2012-12-01 MED ORDER — OXYCODONE-ACETAMINOPHEN 7.5-325 MG PO TABS: 1.0000 | ORAL_TABLET | Freq: Three times a day (TID) | ORAL | Status: DC

## 2012-12-01 MED ORDER — OXYMORPHONE HCL ER 20 MG PO TB12: 20.0000 mg | ORAL_TABLET | Freq: Two times a day (BID) | ORAL | Status: DC

## 2012-12-01 NOTE — Progress Notes (Signed)
Subjective:    Patient ID: Anthony Moran, male    DOB: 1950/09/03, 63 y.o.   MRN: [id number]  HPI The patient complains about chronic pain in his feet bilateral. The patient also complains about numbness and tingling in feet and lower legs bilateral.He also complains about LBP, radiating into his right LE. Patient has a Hx of PSF L4-5, L5-S1 in early 2000s. The patient states, that his symptoms have been stable, although he noted some increasing symptoms after doing some exercises. He reports that he is taking Metformin now, which has made him a little nauseated in the beginning, but he is adapting now.  Pain Inventory Average Pain 7 Pain Right Now 6 My pain is sharp, burning, stabbing, tingling and aching  In the last 24 hours, has pain interfered with the following? General activity 8 Relation with others 7 Enjoyment of life 5 What TIME of day is your pain at its worst? morning Sleep (in general) Poor  Pain is worse with: walking, bending, sitting, inactivity and standing Pain improves with: heat/ice and medication Relief from Meds: 5  Mobility walk without assistance how many minutes can you walk? 3-4 ability to climb steps?  no do you drive?  no transfers alone Do you have any goals in this area?  yes  Function disabled: date disabled . I need assistance with the following:  dressing, meal prep, household duties and shopping Do you have any goals in this area?  yes  Neuro/Psych weakness numbness tremor tingling trouble walking spasms dizziness confusion anxiety  Prior Studies Any changes since last visit?  no  Physicians involved in your care Any changes since last visit?  no   Family History  Problem Relation Age of Onset  . Breast cancer Maternal Aunt   . Prostate cancer Father   . Hypertension Sister    History   Social History  . Marital Status: Married    Spouse Name: N/A    Number of Children: 3  . Years of Education: N/A   Occupational  History  .     Social History Main Topics  . Smoking status: Never Smoker   . Smokeless tobacco: Never Used  . Alcohol Use: No     Comment: rare  . Drug Use: No  . Sexually Active: None   Other Topics Concern  . None   Social History Narrative  . None   Past Surgical History  Procedure Laterality Date  . Lumbar fusion      L 4-5, L5 si   Past Medical History  Diagnosis Date  . Hypertension   . Diabetes mellitus   . Anxiety   . Depression   . Arthritis   . Asthma   . Cervical spondylosis without myelopathy   . Spinal stenosis in cervical region   . Lumbar post-laminectomy syndrome   . Lumbosacral neuritis   . Mononeuritis multiplex    BP 129/81  Pulse 64  Resp 14  Ht 5\' 10"  (1.778 m)  Wt 227 lb (102.967 kg)  BMI 32.57 kg/m2  SpO2 98%     Review of Systems  Constitutional: Positive for unexpected weight change.  Respiratory: Positive for cough, shortness of breath and wheezing.   Gastrointestinal: Positive for nausea, abdominal pain and constipation.  Neurological: Positive for dizziness, tremors, weakness and numbness.  Psychiatric/Behavioral: Positive for confusion. The patient is nervous/anxious.   All other systems reviewed and are negative.       Objective:   Physical Exam Constitutional: He is  oriented to person, place, and time. He appears well-developed and well-nourished.  HENT:  Head: Normocephalic.  Musculoskeletal: He exhibits tenderness.  Neurological: He is alert and oriented to person, place, and time.  Skin: Skin is warm and dry.  Psychiatric: He has a normal mood and affect.  Symmetric normal motor tone is noted throughout. Normal muscle bulk. Muscle testing reveals 5/5 muscle strength of the upper extremity, and 5/5 of the lower extremity. Full range of motion in upper and lower extremities. ROM of spine is restricted. Fine motor movements are normal in both hands.  Sensory is decreased to light touch and pinprick in both feet, right  worse than left, Proprioception intact.  DTR in the upper and lower extremity are present and symmetric 2+. No clonus is noted.  Patient arises from chair without difficulty. Narrow based gait with normal arm swing bilateral .        Assessment & Plan:  1. Lumbar postlaminectomy syndrome with L4-5 and L5-S1 fusion. He has moderate to severe adjacent level disease at L3-L4 and chronic pain as a result. Pain had increased, when he lost his prescription for his pain medications , and Dr. Doroteo Bradford did not give him another prescription, as it is the rule in the patients contract with [place].  2. Cervical stenosis with right C6 foraminal stenosis and chronic intermittent right upper extremity radiculopathy. More pain in his neck, with muscle spasms/pain. Prescribed Robaxin prn. At last visit, this has helped some.  3. Mononeuritis multiplex associated with diabetes he has pain associated with this condition as well.  4. Chronic pain syndrome, insurance is paying for his opana,again . He is on Lyrica 75 mg 3 times a day .  He reports that he is seeing a PCP for his HTN now.  Plan  Refilled his Opana 20mg  bid, and Oxycodone 7.5mg  tid.  Advised patient to walk as much as tolerated. Went through his exercise program, there were some exercises he should not do, because they could exacerbate his symptoms, i discussed with the patient which one , and that he should not do these. I showed him some alternative exercises.  Follow up in 1 month.

## 2012-12-26 ENCOUNTER — Encounter: Payer: Self-pay | Admitting: Physical Medicine and Rehabilitation

## 2012-12-26 ENCOUNTER — Encounter
Payer: Medicaid Other | Attending: Physical Medicine and Rehabilitation | Admitting: Physical Medicine and Rehabilitation

## 2012-12-26 VITALS — BP 150/69 | HR 86 | Resp 14 | Ht 70.0 in | Wt 228.0 lb

## 2012-12-26 DIAGNOSIS — E1149 Type 2 diabetes mellitus with other diabetic neurological complication: Secondary | ICD-10-CM | POA: Insufficient documentation

## 2012-12-26 DIAGNOSIS — Z981 Arthrodesis status: Secondary | ICD-10-CM | POA: Insufficient documentation

## 2012-12-26 DIAGNOSIS — G587 Mononeuritis multiplex: Secondary | ICD-10-CM | POA: Insufficient documentation

## 2012-12-26 DIAGNOSIS — M4802 Spinal stenosis, cervical region: Secondary | ICD-10-CM | POA: Insufficient documentation

## 2012-12-26 DIAGNOSIS — I10 Essential (primary) hypertension: Secondary | ICD-10-CM | POA: Insufficient documentation

## 2012-12-26 DIAGNOSIS — Z79899 Other long term (current) drug therapy: Secondary | ICD-10-CM | POA: Insufficient documentation

## 2012-12-26 DIAGNOSIS — M961 Postlaminectomy syndrome, not elsewhere classified: Secondary | ICD-10-CM | POA: Insufficient documentation

## 2012-12-26 DIAGNOSIS — Z5181 Encounter for therapeutic drug level monitoring: Secondary | ICD-10-CM

## 2012-12-26 DIAGNOSIS — G894 Chronic pain syndrome: Secondary | ICD-10-CM | POA: Insufficient documentation

## 2012-12-26 MED ORDER — OXYMORPHONE HCL ER 20 MG PO TB12: 20.0000 mg | ORAL_TABLET | Freq: Two times a day (BID) | ORAL | Status: DC

## 2012-12-26 MED ORDER — OXYCODONE-ACETAMINOPHEN 7.5-325 MG PO TABS: 1.0000 | ORAL_TABLET | Freq: Three times a day (TID) | ORAL | Status: DC

## 2012-12-26 MED ORDER — DICLOFENAC SODIUM 1 % TD GEL: 2.0000 g | Freq: Four times a day (QID) | TRANSDERMAL | Status: DC

## 2012-12-26 NOTE — Progress Notes (Signed)
Subjective:    Patient ID: Anthony Moran, male    DOB: 08/09/1950, 62 y.o.   MRN: [id number]  HPI The patient complains about chronic pain in his feet bilateral. The patient also complains about numbness and tingling in feet and lower legs bilateral.He also complains about LBP, radiating into his right LE. Patient has a Hx of PSF L4-5, L5-S1 in early 2000s. The patient states, that his symptoms have been stable, although he noted some increasing symptoms after doing some exercises. He reports that he is taking Metformin now, which has made him a little nauseated in the beginning, but he is adapting now.  Pain Inventory Average Pain 6 Pain Right Now 6 My pain is sharp, dull, stabbing, tingling and aching  In the last 24 hours, has pain interfered with the following? General activity 7 Relation with others 3 Enjoyment of life 3 What TIME of day is your pain at its worst? morning Sleep (in general) Poor  Pain is worse with: bending, sitting, inactivity and standing Pain improves with: rest, heat/ice and medication Relief from Meds: 4  Mobility walk without assistance how many minutes can you walk? 40 ability to climb steps?  no do you drive?  no transfers alone Do you have any goals in this area?  yes  Function disabled: date disabled . I need assistance with the following:  bathing, meal prep, household duties and shopping Do you have any goals in this area?  yes  Neuro/Psych weakness numbness tingling spasms dizziness confusion depression anxiety  Prior Studies Any changes since last visit?  no  Physicians involved in your care Any changes since last visit?  no   Family History  Problem Relation Age of Onset  . Breast cancer Maternal Aunt   . Prostate cancer Father   . Hypertension Sister    History   Social History  . Marital Status: Married    Spouse Name: N/A    Number of Children: 3  . Years of Education: N/A   Occupational History  .      Social History Main Topics  . Smoking status: Never Smoker   . Smokeless tobacco: Never Used  . Alcohol Use: No     Comment: rare  . Drug Use: No  . Sexually Active: None   Other Topics Concern  . None   Social History Narrative  . None   Past Surgical History  Procedure Laterality Date  . Lumbar fusion      L 4-5, L5 si   Past Medical History  Diagnosis Date  . Hypertension   . Diabetes mellitus   . Anxiety   . Depression   . Arthritis   . Asthma   . Cervical spondylosis without myelopathy   . Spinal stenosis in cervical region   . Lumbar post-laminectomy syndrome   . Lumbosacral neuritis   . Mononeuritis multiplex    BP 150/69  Pulse 86  Resp 14  Ht 5\' 10"  (1.778 m)  Wt 228 lb (103.42 kg)  BMI 32.71 kg/m2  SpO2 96%     Review of Systems  Constitutional: Positive for diaphoresis, appetite change and unexpected weight change.  Respiratory: Positive for wheezing.   Cardiovascular: Positive for leg swelling.  Gastrointestinal: Positive for nausea, abdominal pain and constipation.  Musculoskeletal: Positive for back pain.  Neurological: Positive for dizziness, weakness and numbness.  Psychiatric/Behavioral: Positive for confusion and dysphoric mood. The patient is nervous/anxious.   All other systems reviewed and are negative.  Objective:   Physical Exam Constitutional: He is oriented to person, place, and time. He appears well-developed and well-nourished.  HENT:  Head: Normocephalic.  Musculoskeletal: He exhibits tenderness.  Neurological: He is alert and oriented to person, place, and time.  Skin: Skin is warm and dry.  Psychiatric: He has a normal mood and affect.  Symmetric normal motor tone is noted throughout. Normal muscle bulk. Muscle testing reveals 5/5 muscle strength of the upper extremity, and 5/5 of the lower extremity. Full range of motion in upper and lower extremities. ROM of spine is restricted. Fine motor movements are normal  in both hands.  Sensory is decreased to light touch and pinprick in both feet, right worse than left, Proprioception intact.  DTR in the upper and lower extremity are present and symmetric 2+. No clonus is noted.  Patient arises from chair without difficulty. Narrow based gait with normal arm swing bilateral .        Assessment & Plan:  1. Lumbar postlaminectomy syndrome with L4-5 and L5-S1 fusion. He has moderate to severe adjacent level disease at L3-L4 and chronic pain as a result. Pain had increased, when he lost his prescription for his pain medications , and Dr. Doroteo Bradford did not give him another prescription, as it is the rule in the patients contract with [place].  2. Cervical stenosis with right C6 foraminal stenosis and chronic intermittent right upper extremity radiculopathy. More pain in his neck, with muscle spasms/pain. Prescribed Robaxin prn. At last visit, this has helped some.  3. Mononeuritis multiplex associated with diabetes he has pain associated with this condition as well.  4. Chronic pain syndrome, insurance is paying for his opana,again . He is on Lyrica 75 mg 3 times a day .  He reports that he is seeing a PCP for his HTN now.  Plan  Refilled his Opana 20mg  bid, and Oxycodone 7.5mg  tid.  Advised patient to walk as much as tolerated. Went through his exercise program, there were some exercises he should not do, because they could exacerbate his symptoms, i discussed with the patient which one , and that he should not do these. I showed him some alternative exercises.  Follow up in 1 month.

## 2012-12-26 NOTE — Patient Instructions (Signed)
Continue with your walking program. 

## 2013-01-22 ENCOUNTER — Encounter
Payer: Medicaid Other | Attending: Physical Medicine and Rehabilitation | Admitting: Physical Medicine and Rehabilitation

## 2013-01-22 ENCOUNTER — Encounter: Payer: Self-pay | Admitting: Physical Medicine and Rehabilitation

## 2013-01-22 VITALS — BP 165/97 | HR 78 | Resp 14 | Ht 70.0 in | Wt 226.0 lb

## 2013-01-22 DIAGNOSIS — M4802 Spinal stenosis, cervical region: Secondary | ICD-10-CM

## 2013-01-22 DIAGNOSIS — M961 Postlaminectomy syndrome, not elsewhere classified: Secondary | ICD-10-CM

## 2013-01-22 DIAGNOSIS — G894 Chronic pain syndrome: Secondary | ICD-10-CM | POA: Insufficient documentation

## 2013-01-22 DIAGNOSIS — Z981 Arthrodesis status: Secondary | ICD-10-CM | POA: Insufficient documentation

## 2013-01-22 DIAGNOSIS — M79609 Pain in unspecified limb: Secondary | ICD-10-CM | POA: Insufficient documentation

## 2013-01-22 DIAGNOSIS — E1149 Type 2 diabetes mellitus with other diabetic neurological complication: Secondary | ICD-10-CM | POA: Insufficient documentation

## 2013-01-22 DIAGNOSIS — G587 Mononeuritis multiplex: Secondary | ICD-10-CM | POA: Insufficient documentation

## 2013-01-22 DIAGNOSIS — M5412 Radiculopathy, cervical region: Secondary | ICD-10-CM | POA: Insufficient documentation

## 2013-01-22 DIAGNOSIS — Z79899 Other long term (current) drug therapy: Secondary | ICD-10-CM | POA: Insufficient documentation

## 2013-01-22 DIAGNOSIS — I10 Essential (primary) hypertension: Secondary | ICD-10-CM | POA: Insufficient documentation

## 2013-01-22 MED ORDER — OXYCODONE-ACETAMINOPHEN 7.5-325 MG PO TABS: 1.0000 | ORAL_TABLET | Freq: Three times a day (TID) | ORAL | Status: DC

## 2013-01-22 MED ORDER — OXYMORPHONE HCL ER 20 MG PO TB12: 20.0000 mg | ORAL_TABLET | Freq: Two times a day (BID) | ORAL | Status: DC

## 2013-01-22 NOTE — Progress Notes (Signed)
Subjective:    Patient ID: Anthony Moran, male    DOB: 07-08-1950, 62 y.o.   MRN: [id number]  HPI The patient complains about chronic pain in his feet bilateral. The patient also complains about numbness and tingling in feet and lower legs bilateral.He also complains about LBP, radiating into his right LE. Patient has a Hx of PSF L4-5, L5-S1 in early 2000s. The patient states, that his symptoms have been stable, although he noted some increasing symptoms after doing some exercises. He reports that he is taking Metformin now, which has made him a little nauseated in the beginning, but he is adapting now. He reports that he has an exacerbation of his LBP and neck pain, but he can still manage at this point.  Pain Inventory Average Pain 8 Pain Right Now 8 My pain is sharp, burning, dull, stabbing, tingling and aching  In the last 24 hours, has pain interfered with the following? General activity 9 Relation with others 9 Enjoyment of life 9 What TIME of day is your pain at its worst? morning Sleep (in general) Poor  Pain is worse with: walking, bending, sitting, inactivity and standing Pain improves with: rest Relief from Meds: 2  Mobility walk without assistance how many minutes can you walk? 2-3 ability to climb steps?  no do you drive?  no transfers alone Do you have any goals in this area?  yes  Function disabled: date disabled . I need assistance with the following:  dressing, bathing, meal prep, household duties and shopping Do you have any goals in this area?  yes  Neuro/Psych weakness numbness tremor tingling trouble walking spasms dizziness confusion depression anxiety  Prior Studies Any changes since last visit?  no  Physicians involved in your care Dr Parke Simmers, Dr Lovell Sheehan   Family History  Problem Relation Age of Onset  . Breast cancer Maternal Aunt   . Prostate cancer Father   . Hypertension Sister    History   Social History  . Marital Status:  Married    Spouse Name: N/A    Number of Children: 3  . Years of Education: N/A   Occupational History  .     Social History Main Topics  . Smoking status: Never Smoker   . Smokeless tobacco: Never Used  . Alcohol Use: No     Comment: rare  . Drug Use: No  . Sexual Activity: None   Other Topics Concern  . None   Social History Narrative  . None   Past Surgical History  Procedure Laterality Date  . Lumbar fusion      L 4-5, L5 si   Past Medical History  Diagnosis Date  . Hypertension   . Diabetes mellitus   . Anxiety   . Depression   . Arthritis   . Asthma   . Cervical spondylosis without myelopathy   . Spinal stenosis in cervical region   . Lumbar post-laminectomy syndrome   . Lumbosacral neuritis   . Mononeuritis multiplex    BP 165/97  Pulse 78  Resp 14  Ht 5\' 10"  (1.778 m)  Wt 226 lb (102.513 kg)  BMI 32.43 kg/m2  SpO2 99%     Review of Systems  Constitutional: Positive for unexpected weight change.  Gastrointestinal: Positive for nausea, abdominal pain and constipation.  Musculoskeletal: Positive for back pain.  Neurological: Positive for dizziness, tremors, weakness and numbness.  Psychiatric/Behavioral: Positive for confusion and dysphoric mood. The patient is nervous/anxious.   All other systems reviewed  and are negative.       Objective:   Physical Exam Constitutional: He is oriented to person, place, and time. He appears well-developed and well-nourished.  HENT:  Head: Normocephalic.  Musculoskeletal: He exhibits tenderness.  Neurological: He is alert and oriented to person, place, and time.  Skin: Skin is warm and dry.  Psychiatric: He has a normal mood and affect.  Symmetric normal motor tone is noted throughout. Normal muscle bulk. Muscle testing reveals 5/5 muscle strength of the upper extremity, and 5/5 of the lower extremity. Full range of motion in upper and lower extremities. ROM of spine is restricted. Fine motor movements are  normal in both hands.  Sensory is decreased to light touch and pinprick in both feet, right worse than left, Proprioception intact.  DTR in the upper and lower extremity are present and symmetric 2+. No clonus is noted.  Patient arises from chair without difficulty. Narrow based gait with normal arm swing bilateral .        Assessment & Plan:  1. Lumbar postlaminectomy syndrome with L4-5 and L5-S1 fusion. He has moderate to severe adjacent level disease at L3-L4 and chronic pain as a result. Pain had mildly increased. 2. Cervical stenosis with right C6 foraminal stenosis and chronic intermittent right upper extremity radiculopathy. More pain in his neck, with muscle spasms/pain. Prescribed Robaxin prn. At last visit, this has helped some.  3. Mononeuritis multiplex associated with diabetes he has pain associated with this condition as well.  4. Chronic pain syndrome, insurance is paying for his opana,again . He is on Lyrica 75 mg 3 times a day .  He reports that he is seeing a PCP for his HTN now.  Plan  Refilled his Opana 20mg  bid, and Oxycodone 7.5mg  tid.  If his exacerbated LBP and neck pain does not get back to baseline, he might call to start the PT, for pain relief I recommended, he does not want to do the PT at this point, because he only gets 4 treatments with medicaid. Advised patient to walk as much as tolerated. Went through his exercise program, there were some exercises he should not do, because they could exacerbate his symptoms, i discussed with the patient which one , and that he should not do these. I showed him some alternative exercises.  Follow up in 1 month.

## 2013-01-22 NOTE — Patient Instructions (Signed)
Try to stay as active as tolerated. Call us if your pain does not get back to baseline, then we will order PT for pain relief only.

## 2013-02-24 ENCOUNTER — Encounter: Payer: Self-pay | Admitting: Physical Medicine and Rehabilitation

## 2013-02-24 ENCOUNTER — Encounter
Payer: Medicaid Other | Attending: Physical Medicine and Rehabilitation | Admitting: Physical Medicine and Rehabilitation

## 2013-02-24 VITALS — BP 120/74 | HR 68 | Resp 14 | Ht 70.0 in | Wt 230.0 lb

## 2013-02-24 DIAGNOSIS — M4802 Spinal stenosis, cervical region: Secondary | ICD-10-CM | POA: Insufficient documentation

## 2013-02-24 DIAGNOSIS — M545 Low back pain, unspecified: Secondary | ICD-10-CM | POA: Insufficient documentation

## 2013-02-24 DIAGNOSIS — G894 Chronic pain syndrome: Secondary | ICD-10-CM | POA: Insufficient documentation

## 2013-02-24 DIAGNOSIS — M961 Postlaminectomy syndrome, not elsewhere classified: Secondary | ICD-10-CM

## 2013-02-24 DIAGNOSIS — M79609 Pain in unspecified limb: Secondary | ICD-10-CM | POA: Insufficient documentation

## 2013-02-24 DIAGNOSIS — G587 Mononeuritis multiplex: Secondary | ICD-10-CM | POA: Insufficient documentation

## 2013-02-24 DIAGNOSIS — M47812 Spondylosis without myelopathy or radiculopathy, cervical region: Secondary | ICD-10-CM

## 2013-02-24 DIAGNOSIS — Z79899 Other long term (current) drug therapy: Secondary | ICD-10-CM | POA: Insufficient documentation

## 2013-02-24 DIAGNOSIS — E1149 Type 2 diabetes mellitus with other diabetic neurological complication: Secondary | ICD-10-CM | POA: Insufficient documentation

## 2013-02-24 DIAGNOSIS — Z981 Arthrodesis status: Secondary | ICD-10-CM | POA: Insufficient documentation

## 2013-02-24 DIAGNOSIS — I10 Essential (primary) hypertension: Secondary | ICD-10-CM | POA: Insufficient documentation

## 2013-02-24 DIAGNOSIS — R209 Unspecified disturbances of skin sensation: Secondary | ICD-10-CM | POA: Insufficient documentation

## 2013-02-24 MED ORDER — PREGABALIN 75 MG PO CAPS: ORAL_CAPSULE | ORAL | Status: DC

## 2013-02-24 MED ORDER — OXYMORPHONE HCL ER 20 MG PO TB12: 20.0000 mg | ORAL_TABLET | Freq: Two times a day (BID) | ORAL | Status: DC

## 2013-02-24 MED ORDER — OXYCODONE-ACETAMINOPHEN 7.5-325 MG PO TABS: 1.0000 | ORAL_TABLET | Freq: Three times a day (TID) | ORAL | Status: DC

## 2013-02-24 NOTE — Patient Instructions (Signed)
Continue with your walking and exercise program

## 2013-02-24 NOTE — Progress Notes (Signed)
Subjective:    Patient ID: Anthony Moran, male    DOB: [birth year], 61 y.o.   MRN: [id number]  HPI The patient complains about chronic pain in his feet bilateral. The patient also complains about numbness and tingling in feet and lower legs bilateral.He also complains about LBP, radiating into his right LE. Patient has a Hx of PSF L4-5, L5-S1 in early 2000s. The patient states, that his symptoms have been stable, although he noted some increasing symptoms after doing some exercises. He reports that he is taking Metformin now, which has made him a little nauseated in the beginning, but he is adapting now.  He reports that he has an exacerbation of his LBP and neck pain,he was complaining about at the last visit has resolved.   Pain Inventory Average Pain 8 Pain Right Now 7 My pain is sharp, burning, dull, stabbing, tingling and aching  In the last 24 hours, has pain interfered with the following? General activity 7 Relation with others 4 Enjoyment of life 8 What TIME of day is your pain at its worst? morning Sleep (in general) Poor  Pain is worse with: walking, bending, sitting, inactivity and standing Pain improves with: rest Relief from Meds: 3  Mobility walk without assistance how many minutes can you walk? 3-5 ability to climb steps?  no do you drive?  no transfers alone Do you have any goals in this area?  yes  Function disabled: date disabled . I need assistance with the following:  dressing, bathing, meal prep, household duties and shopping Do you have any goals in this area?  yes  Neuro/Psych weakness numbness tremor tingling trouble walking spasms dizziness confusion depression anxiety loss of taste or smell  Prior Studies Any changes since last visit?  no  Physicians involved in your care Any changes since last visit?  no   Family History  Problem Relation Age of Onset  . Breast cancer Maternal Aunt   . Prostate cancer Father   . Hypertension  Sister    History   Social History  . Marital Status: Married    Spouse Name: N/A    Number of Children: 3  . Years of Education: N/A   Occupational History  .     Social History Main Topics  . Smoking status: Never Smoker   . Smokeless tobacco: Never Used  . Alcohol Use: No     Comment: rare  . Drug Use: No  . Sexual Activity: None   Other Topics Concern  . None   Social History Narrative  . None   Past Surgical History  Procedure Laterality Date  . Lumbar fusion      L 4-5, L5 si   Past Medical History  Diagnosis Date  . Hypertension   . Diabetes mellitus   . Anxiety   . Depression   . Arthritis   . Asthma   . Cervical spondylosis without myelopathy   . Spinal stenosis in cervical region   . Lumbar post-laminectomy syndrome   . Lumbosacral neuritis   . Mononeuritis multiplex    BP 120/74  Pulse 68  Resp 14  Ht 5\' 10"  (1.778 m)  Wt 230 lb (104.327 kg)  BMI 33 kg/m2  SpO2 98%     Review of Systems  Constitutional: Positive for diaphoresis, activity change and unexpected weight change.  Respiratory: Positive for wheezing.   Gastrointestinal: Positive for nausea, vomiting and abdominal pain.  Musculoskeletal: Positive for gait problem.  Neurological: Positive for dizziness, tremors,  weakness and numbness.  Psychiatric/Behavioral: Positive for confusion and dysphoric mood. The patient is nervous/anxious.   All other systems reviewed and are negative.       Objective:   Physical Exam Constitutional: He is oriented to person, place, and time. He appears well-developed and well-nourished.  HENT:  Head: Normocephalic.  Musculoskeletal: He exhibits tenderness.  Neurological: He is alert and oriented to person, place, and time.  Skin: Skin is warm and dry.  Psychiatric: He has a normal mood and affect.  Symmetric normal motor tone is noted throughout. Normal muscle bulk. Muscle testing reveals 5/5 muscle strength of the upper extremity, and 5/5 of  the lower extremity. Full range of motion in upper and lower extremities. ROM of spine is restricted. Fine motor movements are normal in both hands.  Sensory is decreased to light touch and pinprick in both feet, right worse than left, Proprioception intact.  DTR in the upper and lower extremity are present and symmetric 2+. No clonus is noted.  Patient arises from chair without difficulty. Narrow based gait with normal arm swing bilateral .        Assessment & Plan:  1. Lumbar postlaminectomy syndrome with L4-5 and L5-S1 fusion. He has moderate to severe adjacent level disease at L3-L4 and chronic pain as a result. Pain had mildly increased.  2. Cervical stenosis with right C6 foraminal stenosis and chronic intermittent right upper extremity radiculopathy. More pain in his neck, with muscle spasms/pain. Prescribed Robaxin prn. At last visit, this has helped some.  3. Mononeuritis multiplex associated with diabetes he has pain associated with this condition as well.  4. Chronic pain syndrome, insurance is paying for his opana,again . He is on Lyrica 75 mg 3 times a day .  He reports that he is seeing a PCP for his HTN now.  Plan  Refilled his Opana 20mg  bid, and Oxycodone 7.5mg  tid.    Advised patient to walk as much as tolerated. Went through his exercise program, there were some exercises he should not do, because they could exacerbate his symptoms, I discussed with the patient which one , and that he should not do these. I showed him some alternative exercises.  Follow up in 1 month.

## 2013-03-23 ENCOUNTER — Encounter: Payer: Self-pay | Admitting: Physical Medicine and Rehabilitation

## 2013-03-23 ENCOUNTER — Encounter
Payer: Medicaid Other | Attending: Physical Medicine and Rehabilitation | Admitting: Physical Medicine and Rehabilitation

## 2013-03-23 VITALS — BP 118/71 | HR 63 | Resp 14 | Ht 70.0 in | Wt 228.0 lb

## 2013-03-23 DIAGNOSIS — M5412 Radiculopathy, cervical region: Secondary | ICD-10-CM | POA: Insufficient documentation

## 2013-03-23 DIAGNOSIS — Z981 Arthrodesis status: Secondary | ICD-10-CM | POA: Insufficient documentation

## 2013-03-23 DIAGNOSIS — M545 Low back pain, unspecified: Secondary | ICD-10-CM | POA: Insufficient documentation

## 2013-03-23 DIAGNOSIS — G587 Mononeuritis multiplex: Secondary | ICD-10-CM | POA: Insufficient documentation

## 2013-03-23 DIAGNOSIS — E1149 Type 2 diabetes mellitus with other diabetic neurological complication: Secondary | ICD-10-CM | POA: Insufficient documentation

## 2013-03-23 DIAGNOSIS — M961 Postlaminectomy syndrome, not elsewhere classified: Secondary | ICD-10-CM | POA: Insufficient documentation

## 2013-03-23 DIAGNOSIS — M79609 Pain in unspecified limb: Secondary | ICD-10-CM | POA: Insufficient documentation

## 2013-03-23 DIAGNOSIS — G894 Chronic pain syndrome: Secondary | ICD-10-CM | POA: Insufficient documentation

## 2013-03-23 DIAGNOSIS — I10 Essential (primary) hypertension: Secondary | ICD-10-CM | POA: Insufficient documentation

## 2013-03-23 DIAGNOSIS — R209 Unspecified disturbances of skin sensation: Secondary | ICD-10-CM | POA: Insufficient documentation

## 2013-03-23 DIAGNOSIS — M4802 Spinal stenosis, cervical region: Secondary | ICD-10-CM | POA: Insufficient documentation

## 2013-03-23 MED ORDER — OXYMORPHONE HCL ER 20 MG PO TB12: 20.0000 mg | ORAL_TABLET | Freq: Two times a day (BID) | ORAL | Status: DC

## 2013-03-23 MED ORDER — MELOXICAM 15 MG PO TABS: 15.0000 mg | ORAL_TABLET | Freq: Every day | ORAL | Status: DC

## 2013-03-23 MED ORDER — METHOCARBAMOL 500 MG PO TABS: 500.0000 mg | ORAL_TABLET | Freq: Three times a day (TID) | ORAL | Status: DC

## 2013-03-23 MED ORDER — OXYCODONE-ACETAMINOPHEN 7.5-325 MG PO TABS: 1.0000 | ORAL_TABLET | Freq: Three times a day (TID) | ORAL | Status: DC

## 2013-03-23 NOTE — Patient Instructions (Signed)
Continue with your walking program as pain permits

## 2013-03-23 NOTE — Progress Notes (Signed)
Subjective:    Patient ID: Anthony Moran, male    DOB: Dec 07, 1950, 62 y.o.   MRN: [id number]  HPI The patient complains about chronic pain in his feet bilateral. The patient also complains about numbness and tingling in feet and lower legs bilateral.He also complains about LBP, radiating into his right LE. Patient has a Hx of PSF L4-5, L5-S1 in early 2000s. The patient states, that his symptoms have been stable, although he noted some increasing symptoms after doing some exercises. He reports that he is taking Metformin now, which has made him a little nauseated in the beginning, but he is adapting now.  He reports that he has an exacerbation of his LBP and neck pain,yesterday, when it was so rainy and cold.    Pain Inventory Average Pain 7 Pain Right Now 8 My pain is burning, stabbing, tingling and aching  In the last 24 hours, has pain interfered with the following? General activity 8 Relation with others 4 Enjoyment of life 2 What TIME of day is your pain at its worst? morning Sleep (in general) Poor  Pain is worse with: walking, bending, sitting, inactivity and standing Pain improves with: rest and medication Relief from Meds: 3  Mobility walk without assistance how many minutes can you walk? 10 ability to climb steps?  no do you drive?  no transfers alone Do you have any goals in this area?  yes  Function disabled: date disabled 2000 I need assistance with the following:  dressing, bathing, meal prep, household duties and shopping Do you have any goals in this area?  yes  Neuro/Psych numbness tingling trouble walking spasms dizziness confusion depression anxiety loss of taste or smell  Prior Studies Any changes since last visit?  no  Physicians involved in your care Dr Leonel Ramsay, Dr Lovell Sheehan   Family History  Problem Relation Age of Onset  . Breast cancer Maternal Aunt   . Prostate cancer Father   . Hypertension Sister    History   Social History  .  Marital Status: Married    Spouse Name: N/A    Number of Children: 3  . Years of Education: N/A   Occupational History  .     Social History Main Topics  . Smoking status: Never Smoker   . Smokeless tobacco: Never Used  . Alcohol Use: No     Comment: rare  . Drug Use: No  . Sexual Activity: None   Other Topics Concern  . None   Social History Narrative  . None   Past Surgical History  Procedure Laterality Date  . Lumbar fusion      L 4-5, L5 si   Past Medical History  Diagnosis Date  . Hypertension   . Diabetes mellitus   . Anxiety   . Depression   . Arthritis   . Asthma   . Cervical spondylosis without myelopathy   . Spinal stenosis in cervical region   . Lumbar post-laminectomy syndrome   . Lumbosacral neuritis   . Mononeuritis multiplex    BP 118/71  Pulse 63  Resp 14  Ht 5\' 10"  (1.778 m)  Wt 228 lb (103.42 kg)  BMI 32.71 kg/m2  SpO2 99%     Review of Systems  Constitutional: Positive for diaphoresis, appetite change and unexpected weight change.  Respiratory: Positive for wheezing.   Gastrointestinal: Positive for nausea, vomiting, abdominal pain and constipation.  Musculoskeletal: Positive for arthralgias, back pain, gait problem and neck pain.  Neurological: Positive for numbness.  Psychiatric/Behavioral: Positive for confusion and dysphoric mood. The patient is nervous/anxious.   All other systems reviewed and are negative.       Objective:   Physical Exam Constitutional: He is oriented to person, place, and time. He appears well-developed and well-nourished.  HENT:  Head: Normocephalic.  Musculoskeletal: He exhibits tenderness.  Neurological: He is alert and oriented to person, place, and time.  Skin: Skin is warm and dry.  Psychiatric: He has a normal mood and affect.  Symmetric normal motor tone is noted throughout. Normal muscle bulk. Muscle testing reveals 5/5 muscle strength of the upper extremity, and 5/5 of the lower extremity.  Full range of motion in upper and lower extremities. ROM of spine is restricted. Fine motor movements are normal in both hands.  Sensory is decreased to light touch and pinprick in both feet, right worse than left, Proprioception intact.  DTR in the upper and lower extremity are present and symmetric 2+. No clonus is noted.  Patient arises from chair without difficulty. Narrow based gait with normal arm swing bilateral .        Assessment & Plan:  1. Lumbar postlaminectomy syndrome with L4-5 and L5-S1 fusion. He has moderate to severe adjacent level disease at L3-L4 and chronic pain as a result. Pain had mildly increased.  2. Cervical stenosis with right C6 foraminal stenosis and chronic intermittent right upper extremity radiculopathy.   3. Mononeuritis multiplex associated with diabetes he has pain associated with this condition as well.  4. Chronic pain syndrome, insurance is paying for his opana,again . He is on Lyrica 75 mg 3 times a day .  He reports that he is seeing a PCP for his HTN now.  Plan  Refilled his Opana 20mg  bid, and Oxycodone 7.5mg  tid.  Advised patient to walk as much as tolerated. Went through his exercise program, there were some exercises he should not do, because they could exacerbate his symptoms, I discussed with the patient which one , and that he should not do these. I showed him some alternative exercises. Also advised patient to take his Mobic and Robaxin on the days when he has an exacerbation.He had forgotten that he still has those medications.  Follow up in 1 month.

## 2013-03-25 ENCOUNTER — Other Ambulatory Visit: Payer: Self-pay | Admitting: Physical Medicine and Rehabilitation

## 2013-04-21 ENCOUNTER — Encounter: Payer: Self-pay | Admitting: Physical Medicine and Rehabilitation

## 2013-04-21 ENCOUNTER — Encounter
Payer: Medicaid Other | Attending: Physical Medicine and Rehabilitation | Admitting: Physical Medicine and Rehabilitation

## 2013-04-21 VITALS — BP 136/82 | HR 86 | Resp 14 | Ht 70.5 in | Wt 233.2 lb

## 2013-04-21 DIAGNOSIS — M47817 Spondylosis without myelopathy or radiculopathy, lumbosacral region: Secondary | ICD-10-CM

## 2013-04-21 DIAGNOSIS — G894 Chronic pain syndrome: Secondary | ICD-10-CM | POA: Insufficient documentation

## 2013-04-21 DIAGNOSIS — G587 Mononeuritis multiplex: Secondary | ICD-10-CM | POA: Insufficient documentation

## 2013-04-21 DIAGNOSIS — M961 Postlaminectomy syndrome, not elsewhere classified: Secondary | ICD-10-CM | POA: Insufficient documentation

## 2013-04-21 DIAGNOSIS — I10 Essential (primary) hypertension: Secondary | ICD-10-CM | POA: Insufficient documentation

## 2013-04-21 DIAGNOSIS — Z981 Arthrodesis status: Secondary | ICD-10-CM | POA: Insufficient documentation

## 2013-04-21 DIAGNOSIS — Z79899 Other long term (current) drug therapy: Secondary | ICD-10-CM | POA: Insufficient documentation

## 2013-04-21 DIAGNOSIS — M4802 Spinal stenosis, cervical region: Secondary | ICD-10-CM | POA: Insufficient documentation

## 2013-04-21 DIAGNOSIS — E119 Type 2 diabetes mellitus without complications: Secondary | ICD-10-CM | POA: Insufficient documentation

## 2013-04-21 MED ORDER — OXYCODONE-ACETAMINOPHEN 7.5-325 MG PO TABS: 1.0000 | ORAL_TABLET | Freq: Three times a day (TID) | ORAL | Status: DC

## 2013-04-21 MED ORDER — OXYMORPHONE HCL ER 20 MG PO TB12: 20.0000 mg | ORAL_TABLET | Freq: Two times a day (BID) | ORAL | Status: DC

## 2013-04-21 NOTE — Patient Instructions (Signed)
Stay as active as tolerated. 

## 2013-04-21 NOTE — Progress Notes (Signed)
Subjective:    Patient ID: Anthony Moran, male    DOB: 11-20-1950, 62 y.o.   MRN: [id number]  HPI The patient complains about chronic pain in his feet bilateral. The patient also complains about numbness and tingling in feet and lower legs bilateral.He also complains about LBP, radiating into his right LE. Patient has a Hx of PSF L4-5, L5-S1 in early 2000s. The patient states, that his symptoms have been stable, although he noted some increasing symptoms after doing some exercises.But he is able to walk regularly without increasing his pain too much.   He reports that he is taking Metformin now, which has made him a little nauseated in the beginning, but he is adapting now.  He reports that he has an exacerbation of his LBP and neck pain, when it was so rainy and cold.    Pain Inventory Average Pain 7 Pain Right Now 7 My pain is sharp, dull, stabbing, tingling and aching  In the last 24 hours, has pain interfered with the following? General activity 8 Relation with others 8 Enjoyment of life 9 What TIME of day is your pain at its worst? morning Sleep (in general) Poor  Pain is worse with: bending, sitting, inactivity, standing and some activites Pain improves with: rest, heat/ice and medication Relief from Meds: 4  Mobility walk without assistance how many minutes can you walk? 10-35  Function disabled: date disabled . I need assistance with the following:  dressing, bathing, meal prep, household duties and shopping  Neuro/Psych weakness numbness tingling trouble walking spasms dizziness confusion depression anxiety  Prior Studies Any changes since last visit?  no  Physicians involved in your care Any changes since last visit?  no   Family History  Problem Relation Age of Onset  . Breast cancer Maternal Aunt   . Prostate cancer Father   . Hypertension Sister    History   Social History  . Marital Status: Married    Spouse Name: N/A    Number of  Children: 3  . Years of Education: N/A   Occupational History  .     Social History Main Topics  . Smoking status: Never Smoker   . Smokeless tobacco: Never Used  . Alcohol Use: No     Comment: rare  . Drug Use: No  . Sexual Activity: None   Other Topics Concern  . None   Social History Narrative  . None   Past Surgical History  Procedure Laterality Date  . Lumbar fusion      L 4-5, L5 si   Past Medical History  Diagnosis Date  . Hypertension   . Diabetes mellitus   . Anxiety   . Depression   . Arthritis   . Asthma   . Cervical spondylosis without myelopathy   . Spinal stenosis in cervical region   . Lumbar post-laminectomy syndrome   . Lumbosacral neuritis   . Mononeuritis multiplex    BP 136/82  Pulse 86  Resp 14  Ht 5' 10.5" (1.791 m)  Wt 233 lb 3.2 oz (105.779 kg)  BMI 32.98 kg/m2  SpO2 99%   Review of Systems  Constitutional: Positive for diaphoresis, appetite change and unexpected weight change.  Respiratory: Positive for wheezing.   Cardiovascular: Positive for leg swelling.  Gastrointestinal: Positive for nausea, vomiting, abdominal pain and constipation.  Musculoskeletal: Positive for back pain and gait problem.       Spasms  Neurological: Positive for dizziness, weakness and numbness.  Tingling  Psychiatric/Behavioral: Positive for confusion and dysphoric mood. The patient is nervous/anxious.   All other systems reviewed and are negative.       Objective:   Physical Exam Constitutional: He is oriented to person, place, and time. He appears well-developed and well-nourished.  HENT:  Head: Normocephalic.  Musculoskeletal: He exhibits tenderness.  Neurological: He is alert and oriented to person, place, and time.  Skin: Skin is warm and dry.  Psychiatric: He has a normal mood and affect.  Symmetric normal motor tone is noted throughout. Normal muscle bulk. Muscle testing reveals 5/5 muscle strength of the upper extremity, and 5/5 of  the lower extremity. Full range of motion in upper and lower extremities. ROM of spine is restricted. Fine motor movements are normal in both hands.  Sensory is decreased to light touch and pinprick in both feet, right worse than left, Proprioception intact.  DTR in the upper and lower extremity are present and symmetric 2+. No clonus is noted.  Patient arises from chair without difficulty. Narrow based gait with normal arm swing bilateral .        Assessment & Plan:  1. Lumbar postlaminectomy syndrome with L4-5 and L5-S1 fusion. He has moderate to severe adjacent level disease at L3-L4 and chronic pain as a result. Pain had mildly increased in the colder weather.  2. Cervical stenosis with right C6 foraminal stenosis and chronic intermittent right upper extremity radiculopathy.  3. Mononeuritis multiplex associated with diabetes he has pain associated with this condition as well.  4. Chronic pain syndrome, insurance is paying for his opana,again . He is on Lyrica 75 mg 3 times a day .  He reports that he is seeing a PCP for his HTN now.  Plan  Refilled his Opana 20mg  bid, and Oxycodone 7.5mg  tid.  Advised patient to walk as much as tolerated. Went through his exercise program, there were some exercises he should not do, because they could exacerbate his symptoms, I discussed with the patient which one , and that he should not do these. I showed him some alternative exercises. Also advised patient to take his Mobic and Robaxin on the days when he has an exacerbation.  Follow up in 1 month.

## 2013-05-13 ENCOUNTER — Other Ambulatory Visit: Payer: Self-pay | Admitting: *Deleted

## 2013-05-13 MED ORDER — OXYCODONE-ACETAMINOPHEN 7.5-325 MG PO TABS: 1.0000 | ORAL_TABLET | Freq: Three times a day (TID) | ORAL | Status: DC

## 2013-05-13 MED ORDER — OXYMORPHONE HCL ER 20 MG PO TB12: 20.0000 mg | ORAL_TABLET | Freq: Two times a day (BID) | ORAL | Status: DC

## 2013-05-13 NOTE — Telephone Encounter (Signed)
rx printed early for controlled medication for the visit with RN on 05/19/13 (to be signed by MD) 

## 2013-05-19 ENCOUNTER — Encounter: Payer: Medicaid Other | Attending: Physical Medicine & Rehabilitation | Admitting: *Deleted

## 2013-05-19 ENCOUNTER — Encounter: Payer: Self-pay | Admitting: *Deleted

## 2013-05-19 VITALS — BP 136/75 | HR 61 | Resp 14 | Ht 70.5 in | Wt 233.0 lb

## 2013-05-19 DIAGNOSIS — M961 Postlaminectomy syndrome, not elsewhere classified: Secondary | ICD-10-CM | POA: Insufficient documentation

## 2013-05-19 DIAGNOSIS — M4802 Spinal stenosis, cervical region: Secondary | ICD-10-CM | POA: Insufficient documentation

## 2013-05-19 DIAGNOSIS — G587 Mononeuritis multiplex: Secondary | ICD-10-CM | POA: Insufficient documentation

## 2013-05-19 DIAGNOSIS — G894 Chronic pain syndrome: Secondary | ICD-10-CM | POA: Insufficient documentation

## 2013-05-19 DIAGNOSIS — E119 Type 2 diabetes mellitus without complications: Secondary | ICD-10-CM | POA: Insufficient documentation

## 2013-05-19 DIAGNOSIS — E1141 Type 2 diabetes mellitus with diabetic mononeuropathy: Secondary | ICD-10-CM

## 2013-05-19 NOTE — Progress Notes (Signed)
Here for pill count and medication refills.  Oxycodone 7.5/325 # 90 Fill date 05/08/13    Today NV# 53 and opana ER 20 mg # 60 fill date 05/08/13  Today NV# 35. Pill counts appropriate.  Return week of January 19 for refills and pill count with RN.  No changes in pain levels or medication lists.  Vss

## 2013-05-19 NOTE — Patient Instructions (Signed)
Follow up 3rd week of January for follow up with RN for med refill

## 2013-06-25 ENCOUNTER — Other Ambulatory Visit: Payer: Self-pay | Admitting: *Deleted

## 2013-06-25 MED ORDER — OXYCODONE-ACETAMINOPHEN 7.5-325 MG PO TABS: 1.0000 | ORAL_TABLET | Freq: Three times a day (TID) | ORAL | Status: DC

## 2013-06-25 MED ORDER — OXYMORPHONE HCL ER 20 MG PO TB12: 20.0000 mg | ORAL_TABLET | Freq: Two times a day (BID) | ORAL | Status: DC

## 2013-06-25 NOTE — Telephone Encounter (Signed)
RX printed early for controlled medication for the visit with RN on 06/29/13 (to be signed by MD) 

## 2013-06-29 ENCOUNTER — Encounter: Payer: Self-pay | Admitting: *Deleted

## 2013-06-29 ENCOUNTER — Encounter: Payer: Medicaid Other | Attending: Physical Medicine & Rehabilitation | Admitting: *Deleted

## 2013-06-29 VITALS — BP 134/76 | HR 67 | Resp 14

## 2013-06-29 DIAGNOSIS — M961 Postlaminectomy syndrome, not elsewhere classified: Secondary | ICD-10-CM | POA: Insufficient documentation

## 2013-06-29 DIAGNOSIS — Z79899 Other long term (current) drug therapy: Secondary | ICD-10-CM | POA: Insufficient documentation

## 2013-06-29 DIAGNOSIS — M47817 Spondylosis without myelopathy or radiculopathy, lumbosacral region: Secondary | ICD-10-CM

## 2013-06-29 DIAGNOSIS — M47812 Spondylosis without myelopathy or radiculopathy, cervical region: Secondary | ICD-10-CM | POA: Insufficient documentation

## 2013-06-29 DIAGNOSIS — M4802 Spinal stenosis, cervical region: Secondary | ICD-10-CM

## 2013-06-29 DIAGNOSIS — Z5181 Encounter for therapeutic drug level monitoring: Secondary | ICD-10-CM

## 2013-06-29 NOTE — Patient Instructions (Signed)
Follow up one month with Dr Kirsteins or Pam Love PA 

## 2013-06-29 NOTE — Progress Notes (Signed)
Here for pill count and medication refills. opana ER 20 mg # 59 Fill date 06/06/13    Today NV#11  Percocet 7.5/325 # 42 Fill date 06/06/13  Today NV#  17  VSS   Pain level:4  Opioid risk score is  4 for depression and has a brother wth ETOH hx.  Refills given for Percocet and Opana ER.   Return in one month to se MD or PA

## 2013-07-31 ENCOUNTER — Ambulatory Visit: Payer: Medicaid Other | Admitting: Physical Medicine & Rehabilitation

## 2013-08-03 ENCOUNTER — Encounter: Payer: Medicaid Other | Attending: Physical Medicine & Rehabilitation

## 2013-08-03 ENCOUNTER — Encounter: Payer: Self-pay | Admitting: Physical Medicine & Rehabilitation

## 2013-08-03 ENCOUNTER — Ambulatory Visit (HOSPITAL_BASED_OUTPATIENT_CLINIC_OR_DEPARTMENT_OTHER): Payer: Medicaid Other | Admitting: Physical Medicine & Rehabilitation

## 2013-08-03 VITALS — BP 166/122 | HR 88 | Resp 14 | Ht 70.0 in | Wt 240.0 lb

## 2013-08-03 DIAGNOSIS — G587 Mononeuritis multiplex: Secondary | ICD-10-CM

## 2013-08-03 DIAGNOSIS — E1149 Type 2 diabetes mellitus with other diabetic neurological complication: Secondary | ICD-10-CM

## 2013-08-03 DIAGNOSIS — M961 Postlaminectomy syndrome, not elsewhere classified: Secondary | ICD-10-CM

## 2013-08-03 DIAGNOSIS — E1141 Type 2 diabetes mellitus with diabetic mononeuropathy: Secondary | ICD-10-CM

## 2013-08-03 DIAGNOSIS — M4802 Spinal stenosis, cervical region: Secondary | ICD-10-CM | POA: Insufficient documentation

## 2013-08-03 DIAGNOSIS — G894 Chronic pain syndrome: Secondary | ICD-10-CM | POA: Insufficient documentation

## 2013-08-03 DIAGNOSIS — E119 Type 2 diabetes mellitus without complications: Secondary | ICD-10-CM | POA: Insufficient documentation

## 2013-08-03 MED ORDER — METHOCARBAMOL 500 MG PO TABS
500.0000 mg | ORAL_TABLET | Freq: Three times a day (TID) | ORAL | Status: DC
Start: 2013-08-03 — End: 2014-07-06

## 2013-08-03 MED ORDER — DULOXETINE HCL 60 MG PO CPEP: ORAL_CAPSULE | ORAL | Status: DC

## 2013-08-03 MED ORDER — OXYCODONE-ACETAMINOPHEN 7.5-325 MG PO TABS: 1.0000 | ORAL_TABLET | Freq: Three times a day (TID) | ORAL | Status: DC

## 2013-08-03 MED ORDER — OXYMORPHONE HCL ER 20 MG PO TB12: 20.0000 mg | ORAL_TABLET | Freq: Two times a day (BID) | ORAL | Status: DC

## 2013-08-03 MED ORDER — MELOXICAM 15 MG PO TABS: 15.0000 mg | ORAL_TABLET | Freq: Every day | ORAL | Status: DC

## 2013-08-03 NOTE — Patient Instructions (Signed)
Recommend walking 30 minutes per day. This can be done 10 minutes 3 times a day for 15 minutes twice a day for 30 minutes once

## 2013-08-03 NOTE — Progress Notes (Signed)
Subjective:    Patient ID: Anthony Moran, male    DOB: [birth year], 63 y.o.   MRN: [id number]  HPI A 63 year old male with lumbar post laminectomy syndrome,  chronic lumbar radiculitis. He also has mononeuropathy multiplex with  his diabetes. He has a history of cervical stenosis.   Pain recently increased with cold weather Pain Inventory Average Pain 9 Pain Right Now 8 My pain is constant and aching  In the last 24 hours, has pain interfered with the following? General activity 10 Relation with others 2 Enjoyment of life 2 What TIME of day is your pain at its worst? morning Sleep (in general) Poor  Pain is worse with: walking, bending, sitting, inactivity and standing Pain improves with: rest, heat/ice and medication Relief from Meds: 2  Mobility walk without assistance how many minutes can you walk? 1-3 ability to climb steps?  no do you drive?  no transfers alone Do you have any goals in this area?  yes  Function disabled: date disabled na I need assistance with the following:  dressing, bathing, toileting, meal prep, household duties and shopping Do you have any goals in this area?  yes  Neuro/Psych weakness numbness tremor tingling trouble walking spasms dizziness confusion depression anxiety loss of taste or smell  Prior Studies Any changes since last visit?  no  Physicians involved in your care Any changes since last visit?  no   Family History  Problem Relation Age of Onset  . Breast cancer Maternal Aunt   . Prostate cancer Father   . Hypertension Sister    History   Social History  . Marital Status: Married    Spouse Name: N/A    Number of Children: 3  . Years of Education: N/A   Occupational History  .     Social History Main Topics  . Smoking status: Never Smoker   . Smokeless tobacco: Never Used  . Alcohol Use: No     Comment: rare  . Drug Use: No  . Sexual Activity: None   Other Topics Concern  . None   Social  History Narrative  . None   Past Surgical History  Procedure Laterality Date  . Lumbar fusion      L 4-5, L5 si   Past Medical History  Diagnosis Date  . Hypertension   . Diabetes mellitus   . Anxiety   . Depression   . Arthritis   . Asthma   . Cervical spondylosis without myelopathy   . Spinal stenosis in cervical region   . Lumbar post-laminectomy syndrome   . Lumbosacral neuritis   . Mononeuritis multiplex    BP 166/122  Pulse 88  Resp 14  Ht 5\' 10"  (1.778 m)  Wt 240 lb (108.863 kg)  BMI 34.44 kg/m2  SpO2 96%  Opioid Risk Score:   Fall Risk Score: Moderate Fall Risk (6-13 points) (pt educated on fall risk, brochure already given to pt. )   Review of Systems  Constitutional: Positive for diaphoresis, appetite change and unexpected weight change.  HENT:       Loss of taste or smell   Respiratory: Positive for apnea and wheezing.   Cardiovascular: Positive for leg swelling.  Gastrointestinal: Positive for nausea, vomiting, abdominal pain and diarrhea.  Endocrine:       High blood sugar  Musculoskeletal: Positive for back pain, gait problem and neck pain.  Neurological: Positive for dizziness, tremors, weakness and numbness.  Psychiatric/Behavioral: Positive for confusion and dysphoric mood. The  patient is nervous/anxious.   All other systems reviewed and are negative.       Objective:   Physical Exam  Nursing note and vitals reviewed. Constitutional: He is oriented to person, place, and time. He appears well-developed and well-nourished.  Musculoskeletal:       Lumbar back: He exhibits decreased range of motion. He exhibits no deformity.  Lumbar pain bilateral L4 and L5 and S1. Decreased lumbar flexion and extension approximately 50%  Neurological: He is alert and oriented to person, place, and time. He has normal strength.  Reflex Scores:      Patellar reflexes are 1+ on the right side and 2+ on the left side.      Achilles reflexes are 0 on the right  side and 1+ on the left side. 5/5 bilateral hip flexor knee extensor ankle dorsiflexor plantar flex  Psychiatric: He has a normal mood and affect.          Assessment & Plan:  1.  Lumbar post lami syndrome with chronic radiculitis 2. Cervical stenosis no evidence radiculopathy 3. Diabetes with mononeuropathy multiplex  CURRENT MEDICATIONS:  1. Voltaren gel to his right hand q.i.d. p.r.n.  2. Q.i.d. Cymbalta 60 mg per day.  3. Lyrica 75 t.i.d. was b.i.d. before in October.  4. Oxycodone 7.5/325 t.i.d.  5. Opana ER 20 mg b.i.d.  Continue opioid monitoring program. This consists of regular clinic visits, examinations, urine drug screen, pill counts as well as use of [place] controlled substance reporting System.

## 2013-08-30 ENCOUNTER — Other Ambulatory Visit: Payer: Self-pay | Admitting: *Deleted

## 2013-08-30 MED ORDER — OXYCODONE-ACETAMINOPHEN 7.5-325 MG PO TABS: 1.0000 | ORAL_TABLET | Freq: Three times a day (TID) | ORAL | Status: DC

## 2013-08-30 MED ORDER — OXYMORPHONE HCL ER 20 MG PO TB12: 20.0000 mg | ORAL_TABLET | Freq: Two times a day (BID) | ORAL | Status: DC

## 2013-08-30 NOTE — Telephone Encounter (Signed)
RX printed for MD to sign for RN visit 09/02/13 

## 2013-09-04 ENCOUNTER — Encounter: Payer: Medicaid Other | Admitting: *Deleted

## 2013-09-08 ENCOUNTER — Encounter: Payer: Medicaid Other | Admitting: *Deleted

## 2013-09-14 ENCOUNTER — Encounter: Payer: Medicaid Other | Attending: Physical Medicine & Rehabilitation | Admitting: *Deleted

## 2013-09-14 ENCOUNTER — Encounter: Payer: Self-pay | Admitting: *Deleted

## 2013-09-14 VITALS — BP 147/78 | HR 76 | Resp 14

## 2013-09-14 DIAGNOSIS — M4802 Spinal stenosis, cervical region: Secondary | ICD-10-CM | POA: Insufficient documentation

## 2013-09-14 DIAGNOSIS — Z79899 Other long term (current) drug therapy: Secondary | ICD-10-CM | POA: Insufficient documentation

## 2013-09-14 DIAGNOSIS — M47817 Spondylosis without myelopathy or radiculopathy, lumbosacral region: Secondary | ICD-10-CM | POA: Insufficient documentation

## 2013-09-14 DIAGNOSIS — M47812 Spondylosis without myelopathy or radiculopathy, cervical region: Secondary | ICD-10-CM | POA: Insufficient documentation

## 2013-09-14 DIAGNOSIS — M961 Postlaminectomy syndrome, not elsewhere classified: Secondary | ICD-10-CM

## 2013-09-14 NOTE — Progress Notes (Signed)
Here for pill count and medication refills. Percocet 7.5/325 # 32 Fill date  08/07/13  Today NV#0 Opana ER  20 mg #60 fill date 08/07/13 Today NV# 0  VSS  Pill counts are appropriate.  He missed his last appt because of there was a lapse in his medicaid but that has been restored now.  He will return next month to see Danella Sensing NP.  He does not have an Advanced Directive so I have given him the information about how to get one.  He has had no falls and was educated and given a handout on prevention at previous visit.

## 2013-09-14 NOTE — Patient Instructions (Signed)
Follow up next month with nurse practioner

## 2013-09-21 ENCOUNTER — Telehealth: Payer: Self-pay

## 2013-09-21 NOTE — Telephone Encounter (Signed)
Prior authorization for Opana had been initiated 09/18/2013

## 2013-09-21 NOTE — Telephone Encounter (Signed)
Patient called and stated that he needs a prior authorization on his Opana. Contacted pharmacy to fax the PA to [place].

## 2013-09-28 ENCOUNTER — Telehealth: Payer: Self-pay

## 2013-09-28 NOTE — Telephone Encounter (Signed)
Patient states he need a PA on his Opana ER 20 mg tablets.

## 2013-09-29 NOTE — Telephone Encounter (Signed)
Prior authorization for opana faxed again to [facility].

## 2013-10-09 ENCOUNTER — Other Ambulatory Visit: Payer: Self-pay

## 2013-10-09 MED ORDER — PREGABALIN 75 MG PO CAPS: ORAL_CAPSULE | ORAL | Status: DC

## 2013-10-09 NOTE — Telephone Encounter (Signed)
Pharmacy request received to refill Lyrica. Refill called in to pharmacy.

## 2013-10-13 ENCOUNTER — Encounter: Payer: Medicaid Other | Admitting: Registered Nurse

## 2013-10-13 ENCOUNTER — Telehealth: Payer: Self-pay

## 2013-10-13 ENCOUNTER — Other Ambulatory Visit: Payer: Self-pay

## 2013-10-13 MED ORDER — OXYCODONE-ACETAMINOPHEN 7.5-325 MG PO TABS: 1.0000 | ORAL_TABLET | Freq: Three times a day (TID) | ORAL | Status: DC

## 2013-10-13 MED ORDER — OXYMORPHONE HCL ER 20 MG PO TB12: 20.0000 mg | ORAL_TABLET | Freq: Two times a day (BID) | ORAL | Status: DC

## 2013-10-13 NOTE — Telephone Encounter (Signed)
Patient sick and unable to keep appointment.  Opana and percocet rx printed for his wife to pick up.

## 2013-10-26 ENCOUNTER — Telehealth: Payer: Self-pay

## 2013-10-26 ENCOUNTER — Encounter: Payer: Self-pay | Admitting: Registered Nurse

## 2013-10-26 ENCOUNTER — Encounter: Payer: Medicaid Other | Attending: Physical Medicine & Rehabilitation | Admitting: Registered Nurse

## 2013-10-26 VITALS — BP 154/101 | HR 85 | Resp 14 | Ht 70.0 in | Wt 239.0 lb

## 2013-10-26 DIAGNOSIS — E1149 Type 2 diabetes mellitus with other diabetic neurological complication: Secondary | ICD-10-CM

## 2013-10-26 DIAGNOSIS — M961 Postlaminectomy syndrome, not elsewhere classified: Secondary | ICD-10-CM | POA: Insufficient documentation

## 2013-10-26 DIAGNOSIS — M4802 Spinal stenosis, cervical region: Secondary | ICD-10-CM | POA: Insufficient documentation

## 2013-10-26 DIAGNOSIS — M47817 Spondylosis without myelopathy or radiculopathy, lumbosacral region: Secondary | ICD-10-CM | POA: Insufficient documentation

## 2013-10-26 DIAGNOSIS — G587 Mononeuritis multiplex: Secondary | ICD-10-CM

## 2013-10-26 DIAGNOSIS — Z5181 Encounter for therapeutic drug level monitoring: Secondary | ICD-10-CM

## 2013-10-26 DIAGNOSIS — M47812 Spondylosis without myelopathy or radiculopathy, cervical region: Secondary | ICD-10-CM | POA: Insufficient documentation

## 2013-10-26 DIAGNOSIS — E1141 Type 2 diabetes mellitus with diabetic mononeuropathy: Secondary | ICD-10-CM

## 2013-10-26 DIAGNOSIS — Z79899 Other long term (current) drug therapy: Secondary | ICD-10-CM

## 2013-10-26 MED ORDER — OXYMORPHONE HCL ER 20 MG PO TB12: 20.0000 mg | ORAL_TABLET | Freq: Two times a day (BID) | ORAL | Status: DC

## 2013-10-26 MED ORDER — OXYCODONE-ACETAMINOPHEN 10-325 MG PO TABS: 1.0000 | ORAL_TABLET | Freq: Three times a day (TID) | ORAL | Status: DC

## 2013-10-26 NOTE — Telephone Encounter (Signed)
Pharmacy called to make sure it was okay to fill patient's Oxycodone 10-325. Advise pharmacist that Oxycodone was increase at today OV and Oxycodone 7.5-325 mg were destroyed in office.

## 2013-10-26 NOTE — Progress Notes (Signed)
Subjective:    Patient ID: Anthony Moran, male    DOB: 11-20-1950, 63 y.o.   MRN: [id number]  HPI: Anthony Moran is a 63 year old male who returns for follow up for chronic pain and medication refill. He says his pain is located in his neck, bilateral hips, lower back and bilateral lower extremities. He's also having pain in bilateral hands especially right hand thumb and last digit on right hand (baby finger). He's able to close hands without difficulty. At times has pain in right lower extremity tingling sensations. He says his pain has intensified over the last 5-6 months. He admits he is living a sedentary lifestyle and stays in the bed most of the time due to the pain. He states "his neurosurgeon Dr. Arnoldo Morale told him he need surgery on his neck and lower back he says he doesn't want any more operations".  Pain Inventory Average Pain 9 Pain Right Now 8 My pain is intermittent, sharp, dull, stabbing, tingling and aching  In the last 24 hours, has pain interfered with the following? General activity 9 Relation with others 8 Enjoyment of life 10 What TIME of day is your pain at its worst? morning Sleep (in general) Poor  Pain is worse with: walking, bending, sitting, inactivity and standing Pain improves with: heat/ice and medication Relief from Meds: 2  Mobility walk without assistance ability to climb steps?  no do you drive?  no transfers alone Do you have any goals in this area?  yes  Function disabled: date disabled na I need assistance with the following:  dressing, bathing, meal prep, household duties and shopping Do you have any goals in this area?  yes  Neuro/Psych weakness numbness tingling trouble walking spasms dizziness confusion depression anxiety loss of taste or smell  Prior Studies Any changes since last visit?  no  Physicians involved in your care Any changes since last visit?  no   Family History  Problem Relation Age of Onset  .  Breast cancer Maternal Aunt   . Prostate cancer Father   . Hypertension Sister    History   Social History  . Marital Status: Married    Spouse Name: N/A    Number of Children: 3  . Years of Education: N/A   Occupational History  .     Social History Main Topics  . Smoking status: Never Smoker   . Smokeless tobacco: Never Used  . Alcohol Use: No     Comment: rare  . Drug Use: No  . Sexual Activity: None   Other Topics Concern  . None   Social History Narrative  . None   Past Surgical History  Procedure Laterality Date  . Lumbar fusion      L 4-5, L5 si   Past Medical History  Diagnosis Date  . Hypertension   . Diabetes mellitus   . Anxiety   . Depression   . Arthritis   . Asthma   . Cervical spondylosis without myelopathy   . Spinal stenosis in cervical region   . Lumbar post-laminectomy syndrome   . Lumbosacral neuritis   . Mononeuritis multiplex    BP 154/101  Pulse 85  Resp 14  Ht 5\' 10"  (1.778 m)  Wt 239 lb (108.41 kg)  BMI 34.29 kg/m2  SpO2 98%  Opioid Risk Score:   Fall Risk Score: Moderate Fall Risk (6-13 points) (pt educated on fall risk, brochure given to pt previously)    Review of Systems  Constitutional: Positive for diaphoresis, appetite change and unexpected weight change.  HENT:       Loss of taste or smell  Respiratory: Positive for apnea and wheezing.   Cardiovascular: Positive for leg swelling.  Gastrointestinal: Positive for vomiting, abdominal pain and constipation.  Endocrine:       High blood sugar  Musculoskeletal: Positive for back pain, gait problem and neck pain.  Neurological: Positive for weakness and numbness.       Tingling, spasms  Psychiatric/Behavioral: Positive for confusion.       Depression  All other systems reviewed and are negative.      Objective:   Physical Exam  Nursing note and vitals reviewed. Constitutional: He is oriented to person, place, and time. He appears well-developed and  well-nourished.  HENT:  Head: Normocephalic and atraumatic.  Neck: Normal range of motion. Neck supple.  Cardiovascular: Normal rate, regular rhythm and normal heart sounds.   Pulmonary/Chest: Effort normal and breath sounds normal.  Musculoskeletal:  Normal Muscle Bulk: Muscle Testing Reveals: Upper Extremities: Full ROM and Muscle Strength 5/5 Lumbar Paraspinal Tenderness Noted: L-4- L-5 Spine Flexion 15 degrees Lower Extremities: Flexion Produces pain to Lumbar and bilateral Greater Trochanter Arises from Chair with slight difficulty. Antalgic Gait  Neurological: He is alert and oriented to person, place, and time.  Skin: Skin is warm and dry.  Psychiatric: He has a normal mood and affect.          Assessment & Plan:  1. 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 Discarded oxycodone 7.5 mg/325 mg  #56. 2. Cervical stenosis: Continue exercise and ice therapy and current medication regime. 3. Diabetes with mononeuropathy multiplex: Continue with Cymbalta and Lyrica.  30 minutes of face to face patient care time was spent during this visit. All questions were encouraged and answered.  F/U in 1 month

## 2013-11-26 ENCOUNTER — Encounter: Payer: Medicaid Other | Attending: Physical Medicine & Rehabilitation | Admitting: Registered Nurse

## 2013-11-26 ENCOUNTER — Encounter: Payer: Self-pay | Admitting: Registered Nurse

## 2013-11-26 VITALS — BP 151/91 | HR 92 | Resp 16 | Ht 70.0 in | Wt 240.0 lb

## 2013-11-26 DIAGNOSIS — M961 Postlaminectomy syndrome, not elsewhere classified: Secondary | ICD-10-CM | POA: Insufficient documentation

## 2013-11-26 DIAGNOSIS — M4802 Spinal stenosis, cervical region: Secondary | ICD-10-CM | POA: Insufficient documentation

## 2013-11-26 DIAGNOSIS — E1141 Type 2 diabetes mellitus with diabetic mononeuropathy: Secondary | ICD-10-CM

## 2013-11-26 DIAGNOSIS — Z79899 Other long term (current) drug therapy: Secondary | ICD-10-CM | POA: Insufficient documentation

## 2013-11-26 DIAGNOSIS — M47812 Spondylosis without myelopathy or radiculopathy, cervical region: Secondary | ICD-10-CM | POA: Insufficient documentation

## 2013-11-26 DIAGNOSIS — Z5181 Encounter for therapeutic drug level monitoring: Secondary | ICD-10-CM

## 2013-11-26 DIAGNOSIS — E1149 Type 2 diabetes mellitus with other diabetic neurological complication: Secondary | ICD-10-CM

## 2013-11-26 DIAGNOSIS — G587 Mononeuritis multiplex: Secondary | ICD-10-CM

## 2013-11-26 DIAGNOSIS — M47817 Spondylosis without myelopathy or radiculopathy, lumbosacral region: Secondary | ICD-10-CM | POA: Insufficient documentation

## 2013-11-26 MED ORDER — OXYCODONE-ACETAMINOPHEN 10-325 MG PO TABS: 1.0000 | ORAL_TABLET | Freq: Three times a day (TID) | ORAL | Status: DC

## 2013-11-26 MED ORDER — PREGABALIN 100 MG PO CAPS: 100.0000 mg | ORAL_CAPSULE | Freq: Three times a day (TID) | ORAL | Status: DC

## 2013-11-26 MED ORDER — DICLOFENAC SODIUM 1 % TD GEL: 2.0000 g | Freq: Four times a day (QID) | TRANSDERMAL | Status: DC

## 2013-11-26 MED ORDER — OXYMORPHONE HCL ER 20 MG PO TB12: 20.0000 mg | ORAL_TABLET | Freq: Two times a day (BID) | ORAL | Status: DC

## 2013-11-26 NOTE — Progress Notes (Signed)
Subjective:    Patient ID: Anthony Moran, male    DOB: [birth year], 63 y.o.   MRN: [id number]  HPI: [name] is 63 year old male who returns for follow up for chronic pain and medication refill. He says his pain is located in his neck, middle to lower back, bilateral legs. He rates his pain 8. He states he has been having increase intensity of pain. We will increase Lyric to 100 mg TID. He doesn't follow and exercise program. He's living a sedentary lifestyle. He has been encouraged to increase his activity. He verbalizes understanding. He brought his May Opana script back, he had just filled his medication. The old script was discarded.   Pain Inventory Average Pain 8 Pain Right Now 8 My pain is sharp, burning, dull, stabbing, tingling and aching  In the last 24 hours, has pain interfered with the following? General activity 8 Relation with others 3 Enjoyment of life 2 What TIME of day is your pain at its worst? morning Sleep (in general) Poor  Pain is worse with: walking, bending, sitting, inactivity and standing Pain improves with: rest and heat/ice Relief from Meds: 3  Mobility walk without assistance ability to climb steps?  no do you drive?  no transfers alone Do you have any goals in this area?  yes  Function disabled: date disabled . I need assistance with the following:  dressing, bathing, meal prep, household duties and shopping Do you have any goals in this area?  yes  Neuro/Psych weakness numbness tingling trouble walking spasms dizziness confusion depression anxiety loss of taste or smell  Prior Studies Any changes since last visit?  no  Physicians involved in your care Any changes since last visit?  no   Family History  Problem Relation Age of Onset  . Breast cancer Maternal Aunt   . Prostate cancer Father   . Hypertension Sister    History   Social History  . Marital Status: Married    Spouse Name: N/A    Number of  Children: 3  . Years of Education: N/A   Occupational History  .     Social History Main Topics  . Smoking status: Never Smoker   . Smokeless tobacco: Never Used  . Alcohol Use: No     Comment: rare  . Drug Use: No  . Sexual Activity: None   Other Topics Concern  . None   Social History Narrative  . None   Past Surgical History  Procedure Laterality Date  . Lumbar fusion      L 4-5, L5 si   Past Medical History  Diagnosis Date  . Hypertension   . Diabetes mellitus   . Anxiety   . Depression   . Arthritis   . Asthma   . Cervical spondylosis without myelopathy   . Spinal stenosis in cervical region   . Lumbar post-laminectomy syndrome   . Lumbosacral neuritis   . Mononeuritis multiplex    BP 151/91  Pulse 92  Resp 16  Ht 5\' 10"  (1.778 m)  Wt 240 lb (108.863 kg)  BMI 34.44 kg/m2  SpO2 96%  Opioid Risk Score:   Fall Risk Score: Moderate Fall Risk (6-13 points) (patient educated handout declined)   Review of Systems  Constitutional: Positive for unexpected weight change.  Respiratory: Positive for shortness of breath and wheezing.   Gastrointestinal: Positive for nausea, vomiting, abdominal pain and constipation.  Musculoskeletal: Positive for gait problem.  Neurological: Positive for dizziness, weakness  and numbness.  Psychiatric/Behavioral: Positive for confusion and dysphoric mood. The patient is nervous/anxious.   All other systems reviewed and are negative.      Objective:   Physical Exam  Nursing note and vitals reviewed. Constitutional: He is oriented to person, place, and time. He appears well-developed and well-nourished.  HENT:  Head: Normocephalic and atraumatic.  Neck: Normal range of motion. Neck supple.  Cervical Paraspinal tenderness: C-3- C-5  Pulmonary/Chest: Effort normal and breath sounds normal.  Musculoskeletal:  Normal Muscle Bulk and Muscle Testing Reveals: Upper Extremities: Full ROM and Muscle Strength on the Right 4/5. Left  5/5. Thoracic and Lumbar Hypersensitivity Noted Lower Extremities: Bilateral Flexion Produces Pain to Legs Arises from chair with ease Narrow Based Gait  Neurological: He is alert and oriented to person, place, and time.  Skin: Skin is warm and dry.  Psychiatric: He has a normal mood and affect.          Assessment & Plan:  1. 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  2. Cervical stenosis: Continue exercise and ice therapy and current medication regime.  3. Diabetes with mononeuropathy multiplex: Continue with Cymbalta and Lyrica was increased to 100 mg TID. Encouraged to follow his Diabetic Diet to keep his blood sugars in better control.  30 minutes of face to face patient care time was spent during this visit. All questions were encouraged and answered.  F/U in 1 month.

## 2013-12-12 ENCOUNTER — Other Ambulatory Visit: Payer: Self-pay | Admitting: Physical Medicine & Rehabilitation

## 2013-12-14 ENCOUNTER — Other Ambulatory Visit: Payer: Self-pay

## 2013-12-14 MED ORDER — DULOXETINE HCL 60 MG PO CPEP: ORAL_CAPSULE | ORAL | Status: DC

## 2013-12-25 ENCOUNTER — Encounter: Payer: Self-pay | Admitting: Registered Nurse

## 2013-12-25 ENCOUNTER — Encounter: Payer: Medicaid Other | Attending: Physical Medicine & Rehabilitation | Admitting: Registered Nurse

## 2013-12-25 VITALS — BP 128/81 | HR 88 | Resp 14 | Wt 230.0 lb

## 2013-12-25 DIAGNOSIS — M961 Postlaminectomy syndrome, not elsewhere classified: Secondary | ICD-10-CM

## 2013-12-25 DIAGNOSIS — Z79899 Other long term (current) drug therapy: Secondary | ICD-10-CM | POA: Diagnosis not present

## 2013-12-25 DIAGNOSIS — M47812 Spondylosis without myelopathy or radiculopathy, cervical region: Secondary | ICD-10-CM | POA: Diagnosis not present

## 2013-12-25 DIAGNOSIS — M47817 Spondylosis without myelopathy or radiculopathy, lumbosacral region: Secondary | ICD-10-CM | POA: Diagnosis present

## 2013-12-25 DIAGNOSIS — G587 Mononeuritis multiplex: Secondary | ICD-10-CM

## 2013-12-25 DIAGNOSIS — M4802 Spinal stenosis, cervical region: Secondary | ICD-10-CM | POA: Insufficient documentation

## 2013-12-25 DIAGNOSIS — Z5181 Encounter for therapeutic drug level monitoring: Secondary | ICD-10-CM

## 2013-12-25 DIAGNOSIS — E1149 Type 2 diabetes mellitus with other diabetic neurological complication: Secondary | ICD-10-CM

## 2013-12-25 DIAGNOSIS — E1141 Type 2 diabetes mellitus with diabetic mononeuropathy: Secondary | ICD-10-CM

## 2013-12-25 MED ORDER — OXYMORPHONE HCL ER 20 MG PO TB12: 20.0000 mg | ORAL_TABLET | Freq: Two times a day (BID) | ORAL | Status: DC

## 2013-12-25 MED ORDER — OXYCODONE-ACETAMINOPHEN 10-325 MG PO TABS: 1.0000 | ORAL_TABLET | Freq: Three times a day (TID) | ORAL | Status: DC

## 2013-12-25 NOTE — Progress Notes (Signed)
Subjective:    Patient ID: Anthony Moran, male    DOB: Feb 17, 1951, 63 y.o.   MRN: [id number]  HPI: Mr. Stepen Prins is 63 year old male who returns for follow up for chronic pain and medication refill. He says his pain is located in his lower back, bilateral legs and right knee. He rates his pain 8. He has increased activity slightly. Walking twice a week.. He has been encouraged to increase his activity and start chairexercising. He says he's in pain all the time. Has been encouraged to start with short goal and increase activity slowly. He verbalized understanding.  Pain Inventory Average Pain 8 Pain Right Now 8 My pain is sharp, burning, dull, stabbing, tingling and aching  In the last 24 hours, has pain interfered with the following? General activity 9 Relation with others 2 Enjoyment of life 9 What TIME of day is your pain at its worst? morning Sleep (in general) Poor  Pain is worse with: walking, bending, sitting, inactivity and standing Pain improves with: rest and medication Relief from Meds: 2  Mobility walk without assistance ability to climb steps?  no do you drive?  no  Function disabled: date disabled . I need assistance with the following:  dressing, bathing, meal prep, household duties and shopping  Neuro/Psych weakness numbness tremor tingling trouble walking spasms dizziness confusion depression anxiety loss of taste or smell  Prior Studies Any changes since last visit?  no  Physicians involved in your care Any changes since last visit?  no   Family History  Problem Relation Age of Onset  . Breast cancer Maternal Aunt   . Prostate cancer Father   . Hypertension Sister    History   Social History  . Marital Status: Married    Spouse Name: N/A    Number of Children: 3  . Years of Education: N/A   Occupational History  .     Social History Main Topics  . Smoking status: Never Smoker   . Smokeless tobacco: Never Used  .  Alcohol Use: No     Comment: rare  . Drug Use: No  . Sexual Activity: None   Other Topics Concern  . None   Social History Narrative  . None   Past Surgical History  Procedure Laterality Date  . Lumbar fusion      L 4-5, L5 si   Past Medical History  Diagnosis Date  . Hypertension   . Diabetes mellitus   . Anxiety   . Depression   . Arthritis   . Asthma   . Cervical spondylosis without myelopathy   . Spinal stenosis in cervical region   . Lumbar post-laminectomy syndrome   . Lumbosacral neuritis   . Mononeuritis multiplex    BP 128/81  Pulse 88  Resp 14  Wt 230 lb (104.327 kg)  SpO2 99%  Opioid Risk Score:   Fall Risk Score: Moderate Fall Risk (6-13 points) (previously educated and given handout on fall prevention)  Review of Systems  Constitutional: Positive for diaphoresis, appetite change and unexpected weight change.  Respiratory: Positive for apnea and wheezing.   Cardiovascular: Positive for leg swelling.  Gastrointestinal: Positive for nausea, vomiting, abdominal pain and constipation.  Musculoskeletal: Positive for gait problem.       Spasms  Neurological: Positive for dizziness, tremors, weakness and numbness.       Tingling  Psychiatric/Behavioral: Positive for confusion and dysphoric mood. The patient is nervous/anxious.   All other systems reviewed and  are negative.      Objective:   Physical Exam  Nursing note and vitals reviewed. Constitutional: He is oriented to person, place, and time. He appears well-developed and well-nourished.  HENT:  Head: Normocephalic and atraumatic.  Neck: Normal range of motion. Neck supple.  Cardiovascular: Normal rate and regular rhythm.   Pulmonary/Chest: Effort normal and breath sounds normal.  Musculoskeletal:  Normal Muscle Bulk and Muscle Testing Reveals: Upper Extremities Full ROM and Muscle Strength on the Right 4/5 and Left 5/5. Spinal Forward Flexion 30 Degrees and Extension 20 Degrees Thoracic and  Lumbar Hypersensitivity Lower Extremities: Full ROM and Muscle Strength 5/5 Bilateral Flexion Produces Pain into Lumbar Arises from Chair with Ease Narrow Based Gait  Neurological: He is alert and oriented to person, place, and time.  Skin: Skin is warm and dry.  Psychiatric: He has a normal mood and affect.          Assessment & Plan:  1. 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  2. Cervical stenosis: Continue exercise and ice therapy and current medication regime.  3. Diabetes with mononeuropathy multiplex: Continue with Cymbalta and Lyrica.  Encouraged to follow his Diabetic Diet to keep his blood sugars in better control.   30 minutes of face to face patient care time was spent during this visit. All questions were encouraged and answered.   F/U in 1 month.

## 2014-01-25 ENCOUNTER — Encounter: Payer: Medicaid Other | Admitting: Registered Nurse

## 2014-02-03 ENCOUNTER — Encounter: Payer: Self-pay | Admitting: Registered Nurse

## 2014-02-03 ENCOUNTER — Encounter: Payer: Medicaid Other | Attending: Physical Medicine & Rehabilitation | Admitting: Registered Nurse

## 2014-02-03 VITALS — BP 157/83 | HR 95 | Resp 14 | Ht 70.0 in | Wt 225.0 lb

## 2014-02-03 DIAGNOSIS — G587 Mononeuritis multiplex: Secondary | ICD-10-CM

## 2014-02-03 DIAGNOSIS — M47817 Spondylosis without myelopathy or radiculopathy, lumbosacral region: Secondary | ICD-10-CM | POA: Diagnosis present

## 2014-02-03 DIAGNOSIS — Z5181 Encounter for therapeutic drug level monitoring: Secondary | ICD-10-CM

## 2014-02-03 DIAGNOSIS — M47812 Spondylosis without myelopathy or radiculopathy, cervical region: Secondary | ICD-10-CM | POA: Insufficient documentation

## 2014-02-03 DIAGNOSIS — E1141 Type 2 diabetes mellitus with diabetic mononeuropathy: Secondary | ICD-10-CM

## 2014-02-03 DIAGNOSIS — M961 Postlaminectomy syndrome, not elsewhere classified: Secondary | ICD-10-CM | POA: Diagnosis present

## 2014-02-03 DIAGNOSIS — E1149 Type 2 diabetes mellitus with other diabetic neurological complication: Secondary | ICD-10-CM

## 2014-02-03 DIAGNOSIS — M4802 Spinal stenosis, cervical region: Secondary | ICD-10-CM | POA: Insufficient documentation

## 2014-02-03 DIAGNOSIS — Z79899 Other long term (current) drug therapy: Secondary | ICD-10-CM | POA: Diagnosis not present

## 2014-02-03 MED ORDER — OXYCODONE-ACETAMINOPHEN 10-325 MG PO TABS: 1.0000 | ORAL_TABLET | Freq: Three times a day (TID) | ORAL | Status: DC

## 2014-02-03 MED ORDER — OXYMORPHONE HCL ER 20 MG PO TB12: 20.0000 mg | ORAL_TABLET | Freq: Two times a day (BID) | ORAL | Status: DC

## 2014-02-03 NOTE — Progress Notes (Signed)
Subjective:    Patient ID: Anthony Moran, male    DOB: 07-05-1950, 63 y.o.   MRN: [id number]  HPI:[name] is 63 year old male who returns for follow up for chronic pain and medication refill. He's complaining of generalized pain all over his body. Also says he is having joint pain, and pain in his lower back which radiates into his lower extremities. He rates his pain 9. His current exercise regime is walking and performing stretching exercises. He says he has been having increase intensity of pain, he wasn't able to come to his earlier appointment due to the pain. He has currently been out of his medications for 4 days. He has been educated on the scheduling process and to call when his medications are running low. He verbalizes understanding. He also states he will follow up with Dr. Arnoldo Morale.    Pain Inventory Average Pain 9 Pain Right Now 9 My pain is sharp, burning, dull, stabbing, tingling and aching  In the last 24 hours, has pain interfered with the following? General activity 9 Relation with others 8 Enjoyment of life 10 What TIME of day is your pain at its worst? morning Sleep (in general) Poor  Pain is worse with: walking, bending, sitting, inactivity and standing Pain improves with: rest, heat/ice and medication Relief from Meds: 2  Mobility walk without assistance ability to climb steps?  no do you drive?  no transfers alone Do you have any goals in this area?  yes  Function disabled: date disabled na I need assistance with the following:  dressing, bathing, toileting, meal prep, household duties and shopping Do you have any goals in this area?  yes  Neuro/Psych weakness numbness tremor tingling trouble walking spasms dizziness confusion depression anxiety  Prior Studies Any changes since last visit?  no  Physicians involved in your care Any changes since last visit?  no   Family History  Problem Relation Age of Onset  . Breast  cancer Maternal Aunt   . Prostate cancer Father   . Hypertension Sister    History   Social History  . Marital Status: Married    Spouse Name: N/A    Number of Children: 3  . Years of Education: N/A   Occupational History  .     Social History Main Topics  . Smoking status: Never Smoker   . Smokeless tobacco: Never Used  . Alcohol Use: No     Comment: rare  . Drug Use: No  . Sexual Activity: None   Other Topics Concern  . None   Social History Narrative  . None   Past Surgical History  Procedure Laterality Date  . Lumbar fusion      L 4-5, L5 si   Past Medical History  Diagnosis Date  . Hypertension   . Diabetes mellitus   . Anxiety   . Depression   . Arthritis   . Asthma   . Cervical spondylosis without myelopathy   . Spinal stenosis in cervical region   . Lumbar post-laminectomy syndrome   . Lumbosacral neuritis   . Mononeuritis multiplex    BP 157/83  Pulse 95  Resp 14  Ht 5\' 10"  (1.778 m)  Wt 225 lb (102.059 kg)  BMI 32.28 kg/m2  SpO2 96%  Opioid Risk Score:   Fall Risk Score: Moderate Fall Risk (6-13 points) (pt educated, declined handout)    Review of Systems  Constitutional: Positive for diaphoresis, appetite change and unexpected weight change.  Respiratory: Positive for wheezing.   Cardiovascular: Positive for leg swelling.  Gastrointestinal: Positive for nausea, vomiting, abdominal pain and constipation.  Musculoskeletal: Positive for back pain.  Neurological: Positive for dizziness, weakness and numbness.       Tingling, spasms  Psychiatric/Behavioral: Positive for confusion. The patient is nervous/anxious.        Depression  All other systems reviewed and are negative.      Objective:   Physical Exam  Nursing note and vitals reviewed. Constitutional: He is oriented to person, place, and time. He appears well-developed and well-nourished.  HENT:  Head: Normocephalic and atraumatic.  Neck: Normal range of motion. Neck supple.    Cardiovascular: Normal rate and regular rhythm.   Pulmonary/Chest: Effort normal and breath sounds normal.  Musculoskeletal:  Normal Muscle Bulk and Muscle testing Reveals: Upper Extremities: Full ROM and Muscle strength 5/5 Thoracic Hypersensitivity Noted Lumbar Paraspinal Tenderness: L-3- L-5 Lower Extremities: Full ROM and Muscle strength 5/5 Arises from the chair with slight difficulty Narrow Based Gait  Neurological: He is alert and oriented to person, place, and time.  Skin: Skin is warm and dry.  Psychiatric: He has a normal mood and affect.          Assessment & Plan:  1. 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  2. Cervical stenosis: Continue exercise and ice therapy and current medication regime.  3. Diabetes with mononeuropathy multiplex: Continue with Cymbalta and Lyrica.   30 minutes of face to face patient care time was spent during this visit. All questions were encouraged and answered.  F/U in 1 month.

## 2014-02-12 ENCOUNTER — Emergency Department (HOSPITAL_COMMUNITY): Payer: Medicaid Other

## 2014-02-12 ENCOUNTER — Encounter (HOSPITAL_COMMUNITY): Payer: Self-pay | Admitting: Emergency Medicine

## 2014-02-12 ENCOUNTER — Telehealth: Payer: Self-pay | Admitting: Registered Nurse

## 2014-02-12 ENCOUNTER — Emergency Department (HOSPITAL_COMMUNITY)
Admission: EM | Admit: 2014-02-12 | Discharge: 2014-02-12 | Disposition: A | Discharge status: Home / Self Care | Payer: Medicaid Other | Attending: Emergency Medicine | Admitting: Emergency Medicine

## 2014-02-12 DIAGNOSIS — J45909 Unspecified asthma, uncomplicated: Secondary | ICD-10-CM | POA: Diagnosis not present

## 2014-02-12 DIAGNOSIS — Z79899 Other long term (current) drug therapy: Secondary | ICD-10-CM | POA: Insufficient documentation

## 2014-02-12 DIAGNOSIS — G8929 Other chronic pain: Secondary | ICD-10-CM

## 2014-02-12 DIAGNOSIS — G587 Mononeuritis multiplex: Secondary | ICD-10-CM | POA: Insufficient documentation

## 2014-02-12 DIAGNOSIS — I10 Essential (primary) hypertension: Secondary | ICD-10-CM | POA: Diagnosis not present

## 2014-02-12 DIAGNOSIS — M545 Low back pain, unspecified: Secondary | ICD-10-CM | POA: Insufficient documentation

## 2014-02-12 DIAGNOSIS — Z791 Long term (current) use of non-steroidal anti-inflammatories (NSAID): Secondary | ICD-10-CM | POA: Insufficient documentation

## 2014-02-12 DIAGNOSIS — F3289 Other specified depressive episodes: Secondary | ICD-10-CM | POA: Diagnosis not present

## 2014-02-12 DIAGNOSIS — E119 Type 2 diabetes mellitus without complications: Secondary | ICD-10-CM | POA: Diagnosis not present

## 2014-02-12 DIAGNOSIS — M47812 Spondylosis without myelopathy or radiculopathy, cervical region: Secondary | ICD-10-CM | POA: Insufficient documentation

## 2014-02-12 DIAGNOSIS — Z981 Arthrodesis status: Secondary | ICD-10-CM | POA: Insufficient documentation

## 2014-02-12 DIAGNOSIS — F329 Major depressive disorder, single episode, unspecified: Secondary | ICD-10-CM | POA: Diagnosis not present

## 2014-02-12 DIAGNOSIS — M129 Arthropathy, unspecified: Secondary | ICD-10-CM | POA: Diagnosis not present

## 2014-02-12 DIAGNOSIS — IMO0002 Reserved for concepts with insufficient information to code with codable children: Secondary | ICD-10-CM | POA: Insufficient documentation

## 2014-02-12 DIAGNOSIS — F411 Generalized anxiety disorder: Secondary | ICD-10-CM | POA: Diagnosis not present

## 2014-02-12 DIAGNOSIS — M549 Dorsalgia, unspecified: Secondary | ICD-10-CM

## 2014-02-12 NOTE — ED Provider Notes (Signed)
Medical screening examination/treatment/procedure(s) were performed by non-physician practitioner and as supervising physician I was immediately available for consultation/collaboration.   EKG Interpretation None      Rolland Porter, MD, [facility]   Janice Norrie, MD 02/12/14 [phone]

## 2014-02-12 NOTE — Discharge Instructions (Signed)
Sciatica Sciatica is pain, weakness, numbness, or tingling along the path of the sciatic nerve. The nerve starts in the lower back and runs down the back of each leg. The nerve controls the muscles in the lower leg and in the back of the knee, while also providing sensation to the back of the thigh, lower leg, and the sole of your foot. Sciatica is a symptom of another medical condition. For instance, nerve damage or certain conditions, such as a herniated disk or bone spur on the spine, pinch or put pressure on the sciatic nerve. This causes the pain, weakness, or other sensations normally associated with sciatica. Generally, sciatica only affects one side of the body. CAUSES   Herniated or slipped disc.  Degenerative disk disease.  A pain disorder involving the narrow muscle in the buttocks (piriformis syndrome).  Pelvic injury or fracture.  Pregnancy.  Tumor (rare). SYMPTOMS  Symptoms can vary from mild to very severe. The symptoms usually travel from the low back to the buttocks and down the back of the leg. Symptoms can include:  Mild tingling or dull aches in the lower back, leg, or hip.  Numbness in the back of the calf or sole of the foot.  Burning sensations in the lower back, leg, or hip.  Sharp pains in the lower back, leg, or hip.  Leg weakness.  Severe back pain inhibiting movement. These symptoms may get worse with coughing, sneezing, laughing, or prolonged sitting or standing. Also, being overweight may worsen symptoms. DIAGNOSIS  Your caregiver will perform a physical exam to look for common symptoms of sciatica. He or she may ask you to do certain movements or activities that would trigger sciatic nerve pain. Other tests may be performed to find the cause of the sciatica. These may include:  Blood tests.  X-rays.  Imaging tests, such as an MRI or CT scan. TREATMENT  Treatment is directed at the cause of the sciatic pain. Sometimes, treatment is not necessary  and the pain and discomfort goes away on its own. If treatment is needed, your caregiver may suggest:  Over-the-counter medicines to relieve pain.  Prescription medicines, such as anti-inflammatory medicine, muscle relaxants, or narcotics.  Applying heat or ice to the painful area.  Steroid injections to lessen pain, irritation, and inflammation around the nerve.  Reducing activity during periods of pain.  Exercising and stretching to strengthen your abdomen and improve flexibility of your spine. Your caregiver may suggest losing weight if the extra weight makes the back pain worse.  Physical therapy.  Surgery to eliminate what is pressing or pinching the nerve, such as a bone spur or part of a herniated disk. HOME CARE INSTRUCTIONS   Only take over-the-counter or prescription medicines for pain or discomfort as directed by your caregiver.  Apply ice to the affected area for 20 minutes, 3-4 times a day for the first 48-72 hours. Then try heat in the same way.  Exercise, stretch, or perform your usual activities if these do not aggravate your pain.  Attend physical therapy sessions as directed by your caregiver.  Keep all follow-up appointments as directed by your caregiver.  Do not wear high heels or shoes that do not provide proper support.  Check your mattress to see if it is too soft. A firm mattress may lessen your pain and discomfort. SEEK IMMEDIATE MEDICAL CARE IF:   You lose control of your bowel or bladder (incontinence).  You have increasing weakness in the lower back, pelvis, buttocks,   or legs.  You have redness or swelling of your back.  You have a burning sensation when you urinate.  You have pain that gets worse when you lie down or awakens you at night.  Your pain is worse than you have experienced in the past.  Your pain is lasting longer than 4 weeks.  You are suddenly losing weight without reason. MAKE SURE YOU:  Understand these  instructions.  Will watch your condition.  Will get help right away if you are not doing well or get worse. Document Released: 05/22/2001 Document Revised: 11/27/2011 Document Reviewed: 10/07/2011 [employer] Patient Information 2015 [employer]. This information is not intended to replace advice given to you by your health care provider. Make sure you discuss any questions you have with your health care provider.  

## 2014-02-12 NOTE — ED Provider Notes (Signed)
CSN: [id number]     Arrival date & time 02/12/14  1448 History  This chart was scribed for a non-physician practitioner, Lorre Munroe, PA-C working with Janice Norrie, MD by Martinique Peace, ED Scribe. The patient was seen in [facility]. The patient's care was started at 4:06 PM.    Chief Complaint  Patient presents with  . Back Pain    2 week hx of increased r/low back pain      Patient is a 63 y.o. male presenting with back pain. The history is provided by the patient. No language interpreter was used.  Back Pain Location:  Lumbar spine Radiates to:  R posterior upper leg Pain severity:  Severe Pain is:  Worse during the night Duration:  2 weeks Timing:  Constant Progression:  Unchanged Relieved by:  Nothing Worsened by:  Bending, ambulation, movement and sitting Associated symptoms: no fever   HPI Comments: Lemmie Vanlanen is a 63 y.o. male who presents to the Emergency Department complaining of lower back pain onset 2 weeks that radiates down his R leg. Pt states that pain is exacerbated with ambulation and applying pressure right side. Pt also states trouble sitting down to use the bathroom due to pain. He reports that suffers intermittently with same issue. Pt reports that he has pain management doctor but denies any relief or improvement with symptoms. He further denies any recent falls that could be responsible. Pt has past surgical history of lumbar fusion.    Past Medical History  Diagnosis Date  . Hypertension   . Diabetes mellitus   . Anxiety   . Depression   . Arthritis   . Asthma   . Cervical spondylosis without myelopathy   . Spinal stenosis in cervical region   . Lumbar post-laminectomy syndrome   . Lumbosacral neuritis   . Mononeuritis multiplex    Past Surgical History  Procedure Laterality Date  . Lumbar fusion      L 4-5, L5 si   Family History  Problem Relation Age of Onset  . Breast cancer Maternal Aunt   . Prostate cancer Father   . Hypertension  Sister    History  Substance Use Topics  . Smoking status: Never Smoker   . Smokeless tobacco: Never Used  . Alcohol Use: No     Comment: rare    Review of Systems  Constitutional: Negative for fever and chills.  Gastrointestinal: Negative for nausea, vomiting and diarrhea.       No bowel incontinence  Genitourinary:       No urinary incontinence  Musculoskeletal: Positive for arthralgias, back pain and myalgias.  Neurological:       No saddle anesthesia      Allergies  Isosorb dinitrate-hydralazine  Home Medications   Prior to Admission medications   Medication Sig Start Date End Date Taking? Authorizing Provider  amLODipine (NORVASC) 10 MG tablet Take 10 mg by mouth daily.      Historical Provider, MD  clobetasol cream (TEMOVATE) 6.78 % Apply 1 application topically 2 (two) times daily.    Lucianne Lei, MD  cloNIDine (CATAPRES - DOSED IN MG/24 HR) 0.3 mg/24hr Place 1 patch onto the skin once a week.    Historical Provider, MD  clotrimazole-betamethasone (LOTRISONE) cream Apply 1 application topically 2 (two) times daily.    Lucianne Lei, MD  diclofenac sodium (VOLTAREN) 1 % GEL Apply 2 g topically 4 (four) times daily. 11/26/13   Danella Sensing, NP  DULoxetine (CYMBALTA) 60 MG capsule TAKE  ONE CAPSULE BY MOUTH EVERY DAY 12/14/13   Charlett Blake, MD  fexofenadine (ALLEGRA) 180 MG tablet Take 180 mg by mouth daily.    Historical Provider, MD  glipiZIDE (GLUCOTROL) 10 MG tablet Take 10 mg by mouth daily.    Ron Parker, MD  losartan-hydrochlorothiazide (HYZAAR) 100-12.5 MG per tablet Take 1 tablet by mouth daily.    Historical Provider, MD  meloxicam (MOBIC) 15 MG tablet Take 1 tablet (15 mg total) by mouth daily. 08/03/13   Charlett Blake, MD  metFORMIN (GLUCOPHAGE) 850 MG tablet Take 850 mg by mouth 2 (two) times daily with a meal.    Historical Provider, MD  methocarbamol (ROBAXIN) 500 MG tablet Take 1 tablet (500 mg total) by mouth 3 (three) times daily. 08/03/13    Charlett Blake, MD  methylcellulose packet Take 1 each by mouth at bedtime.    Historical Provider, MD  oxyCODONE-acetaminophen (PERCOCET) 10-325 MG per tablet Take 1 tablet by mouth 3 (three) times daily. 02/03/14   Danella Sensing, NP  oxymorphone (OPANA ER) 20 MG 12 hr tablet Take 1 tablet (20 mg total) by mouth every 12 (twelve) hours. 02/03/14   Danella Sensing, NP  polyethylene glycol (MIRALAX / GLYCOLAX) packet Take 17 g by mouth daily as needed.    Historical Provider, MD  pregabalin (LYRICA) 100 MG capsule Take 1 capsule (100 mg total) by mouth 3 (three) times daily. 11/26/13   Danella Sensing, NP  promethazine (PHENERGAN) 25 MG tablet Take 25 mg by mouth every 6 (six) hours as needed.    Ron Parker, MD  rosuvastatin (CRESTOR) 10 MG tablet Take 10 mg by mouth daily.    Historical Provider, MD  Tamsulosin HCl (FLOMAX) 0.4 MG CAPS Take 0.4 mg by mouth at bedtime.    Historical Provider, MD  valsartan-hydrochlorothiazide (DIOVAN-HCT) 320-25 MG per tablet Take 1 tablet by mouth daily.    Historical Provider, MD   BP 106/76  Pulse 96  Temp(Src) 98.8 F (37.1 C) (Oral)  Resp 20  Wt 226 lb (102.513 kg)  SpO2 100% Physical Exam  Nursing note and vitals reviewed. Constitutional: He is oriented to person, place, and time. He appears well-developed and well-nourished. No distress.  HENT:  Head: Normocephalic and atraumatic.  Eyes: Conjunctivae and EOM are normal. Right eye exhibits no discharge. Left eye exhibits no discharge. No scleral icterus.  Neck: Normal range of motion. Neck supple. No tracheal deviation present.  Cardiovascular: Normal rate, regular rhythm and normal heart sounds.  Exam reveals no gallop and no friction rub.   No murmur heard. Pulmonary/Chest: Effort normal and breath sounds normal. No respiratory distress. He has no wheezes.  Abdominal: Soft. He exhibits no distension. There is no tenderness.  Musculoskeletal: Normal range of motion.  Lumbar paraspinal muscles  tender to palpation, no bony tenderness, step-offs, or gross abnormality or deformity of spine, patient is able to ambulate, moves all extremities  Bilateral great toe extension intact Bilateral plantar/dorsiflexion intact  Neurological: He is alert and oriented to person, place, and time. He has normal reflexes.  Sensation and strength intact bilaterally Symmetrical reflexes  Skin: Skin is warm and dry. He is not diaphoretic.  Psychiatric: He has a normal mood and affect. His behavior is normal. Judgment and thought content normal.    ED Course  Procedures (including critical care time) Labs Review Labs Reviewed - No data to display  Results for orders placed in visit on 07/03/11  GLUCOSE, CAPILLARY  Result Value Ref Range   Glucose-Capillary 127 (*) 70 - 99 mg/dL  GLUCOSE, CAPILLARY      Result Value Ref Range   Glucose-Capillary 125 (*) 70 - 99 mg/dL   Comment 1 Notify RN     No results found.    Imaging Review Dg Lumbar Spine Complete  02/12/2014   CLINICAL DATA:  Back pain  EXAM: LUMBAR SPINE - COMPLETE 4+ VIEW  COMPARISON:  01/03/2010  FINDINGS: Minimal anterior listhesis of L3 on L4 of about 2-3 mm unchanged from prior study. L4 through S1 transpedicular screws with connecting rods again identified with L4-5 and L5-S1 cage devices. Moderate L3-4 degenerative disc disease.  IMPRESSION: No acute abnormalities   Electronically Signed   By: Skipper Cliche M.D.   On: 02/12/2014 16:48     EKG Interpretation None     Medications - No data to display  4:06 PM- Treatment plan was discussed with patient who verbalizes understanding and agrees.   MDM   Final diagnoses:  Chronic back pain    Patient with back pain.  No neurological deficits and normal neuro exam.  Patient is ambulatory.  No loss of bowel or bladder control.  Doubt cauda equina.  Denies fever,  doubt epidural abscess or other lesion. Recommend back exercises, stretching, RICE  Encouraged the patient  that there could be a need for additional workup and/or imaging such as MRI, if the symptoms do not resolve. Patient advised that if the back pain does not resolve, or radiates, this could progress to more serious conditions and is encouraged to follow-up with PCP or orthopedics within 2 weeks.     I personally performed the services described in this documentation, which was scribed in my presence. The recorded information has been reviewed and is accurate.     Montine Circle, PA-C 02/12/14 [phone]

## 2014-02-12 NOTE — Telephone Encounter (Signed)
Patient's wife called to let you know that she is taking him to [facility] ED. Anthony Moran is reporting pain so bad that he is not able to stand or walk. He states the increase in his medications is not helping. I told them a [facility] facility was the best for him so that the ED would have access to his records from here. His wife thanked me and I told her I would inform you as she asked.

## 2014-02-12 NOTE — ED Notes (Signed)
Pt reports severe r/lower back pain radiating to foot. Pt is seen by [facility]-Dr Kirstin

## 2014-03-03 ENCOUNTER — Encounter: Payer: Self-pay | Admitting: Registered Nurse

## 2014-03-03 ENCOUNTER — Encounter: Payer: Medicaid Other | Attending: Physical Medicine & Rehabilitation | Admitting: Registered Nurse

## 2014-03-03 VITALS — BP 150/96 | HR 89 | Resp 14 | Ht 70.0 in | Wt 221.0 lb

## 2014-03-03 DIAGNOSIS — M545 Low back pain, unspecified: Secondary | ICD-10-CM

## 2014-03-03 DIAGNOSIS — M47812 Spondylosis without myelopathy or radiculopathy, cervical region: Secondary | ICD-10-CM | POA: Diagnosis not present

## 2014-03-03 DIAGNOSIS — Z5181 Encounter for therapeutic drug level monitoring: Secondary | ICD-10-CM

## 2014-03-03 DIAGNOSIS — M961 Postlaminectomy syndrome, not elsewhere classified: Secondary | ICD-10-CM | POA: Diagnosis present

## 2014-03-03 DIAGNOSIS — M4802 Spinal stenosis, cervical region: Secondary | ICD-10-CM | POA: Diagnosis present

## 2014-03-03 DIAGNOSIS — M47817 Spondylosis without myelopathy or radiculopathy, lumbosacral region: Secondary | ICD-10-CM | POA: Diagnosis present

## 2014-03-03 DIAGNOSIS — Z79899 Other long term (current) drug therapy: Secondary | ICD-10-CM | POA: Insufficient documentation

## 2014-03-03 MED ORDER — OXYCODONE-ACETAMINOPHEN 10-325 MG PO TABS: 1.0000 | ORAL_TABLET | Freq: Three times a day (TID) | ORAL | Status: DC

## 2014-03-03 MED ORDER — OXYMORPHONE HCL ER 20 MG PO TB12: 20.0000 mg | ORAL_TABLET | Freq: Two times a day (BID) | ORAL | Status: DC

## 2014-03-03 NOTE — Progress Notes (Signed)
Subjective:    Patient ID: Anthony Moran, male    DOB: 09/28/1950, 63 y.o.   MRN: [id number]  HPI: [name] is 63 year old male who returns for follow up for chronic pain and medication refill. He says his pain is located in his lower back which radiates into his right hip and laterally into  lower extremitiy. He rates his pain 10. His current exercise regime is walking for short distances.  he He went to [facility] on 02/12/2014 for back pain.  X: Ray Lumbar Spine: FINDINGS: Minimal anterior listhesis of L3 on L4 of about 2-3 mm unchanged from prior study. L4 through S1 transpedicular screws with connecting rods again identified with L4-5 and L5-S1 cage devices. Moderate L3-4 degenerative disc disease. IMPRESSION: No acute abnormalities Electronically Signed By: Skipper Cliche M.D. On: 02/12/2014 16:48  He states: Dr. Arnoldo Moran will see him once he obtains a MRI, MRI ordered today. Medicaid Approval Pending.  Pain Inventory Average Pain 10 Pain Right Now 10 My pain is constant, sharp, burning, stabbing and aching  In the last 24 hours, has pain interfered with the following? General activity 10 Relation with others 9 Enjoyment of life 10 What TIME of day is your pain at its worst? morning Sleep (in general) Poor  Pain is worse with: walking, bending, sitting, inactivity and standing Pain improves with: rest, heat/ice and medication Relief from Meds: 1  Mobility walk without assistance  Function disabled: date disabled 2000  Neuro/Psych weakness numbness tremor tingling trouble walking spasms dizziness confusion depression anxiety loss of taste or smell  Prior Studies Any changes since last visit?  no  Physicians involved in your care Any changes since last visit?  no   Family History  Problem Relation Age of Onset  . Breast cancer Maternal Aunt   . Prostate cancer Father   . Hypertension Sister    History   Social History  . Marital Status:  Married    Spouse Name: N/A    Number of Children: 3  . Years of Education: N/A   Occupational History  .     Social History Main Topics  . Smoking status: Never Smoker   . Smokeless tobacco: Never Used  . Alcohol Use: No     Comment: rare  . Drug Use: No  . Sexual Activity: None   Other Topics Concern  . None   Social History Narrative  . None   Past Surgical History  Procedure Laterality Date  . Lumbar fusion      L 4-5, L5 si   Past Medical History  Diagnosis Date  . Hypertension   . Diabetes mellitus   . Anxiety   . Depression   . Arthritis   . Asthma   . Cervical spondylosis without myelopathy   . Spinal stenosis in cervical region   . Lumbar post-laminectomy syndrome   . Lumbosacral neuritis   . Mononeuritis multiplex    BP 150/96  Pulse 89  Resp 14  Ht 5\' 10"  (1.778 m)  Wt 221 lb (100.245 kg)  BMI 31.71 kg/m2  SpO2 96%  Opioid Risk Score:   Fall Risk Score: Moderate Fall Risk (6-13 points)   Review of Systems     Objective:   Physical Exam  Nursing note and vitals reviewed. Constitutional: He is oriented to person, place, and time. He appears well-developed and well-nourished.  HENT:  Head: Normocephalic and atraumatic.  Neck: Normal range of motion. Neck supple.  Cardiovascular: Normal  rate and regular rhythm.   Pulmonary/Chest: Effort normal and breath sounds normal.  Musculoskeletal:  Normal Muscle Bulk and Muscle Testing Reveals: Upper Extremities: Full ROM and Muscle Strength 5/5 Hypersensitivity to Thoracic and Lumbar Lower extremities: Full ROM and Muscle Strength 5/5 Left Leg Flexion Produces Pain into Lumbar Right Side Arises from chair with slight Difficulty Antalgic Gait Noted  Neurological: He is alert and oriented to person, place, and time.  Skin: Skin is warm and dry.  Psychiatric: He has a normal mood and affect.          Assessment & Plan:  1. 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  2. Cervical stenosis: Continue exercise and ice therapy and current medication regime.  3. Diabetes with mononeuropathy multiplex: Continue with Cymbalta and Lyrica.  20 minutes of face to face patient care time was spent during this visit. All questions were encouraged and answered.   F/U in 1 month.

## 2014-03-05 ENCOUNTER — Telehealth: Payer: Self-pay | Admitting: Registered Nurse

## 2014-03-05 NOTE — Telephone Encounter (Signed)
Tried calling Anthony Moran back: If he calls back make sure he's taking his Mobic and Robaxin. If he says Yes.  He may increase his Percocet to 4 times a day,  Call a week prior to medication running out for a new script

## 2014-03-05 NOTE — Telephone Encounter (Signed)
Tried Calling Anthony Moran,  No answer Left message to call the office

## 2014-03-05 NOTE — Telephone Encounter (Signed)
Patient called back for results/Anthony Moran 11:09 am today.  Forwarding to [employer]

## 2014-03-09 ENCOUNTER — Telehealth: Payer: Self-pay | Admitting: Registered Nurse

## 2014-03-09 NOTE — Telephone Encounter (Signed)
Left another message for [name], [employer]. Awaiting his call back. Left a message to call office

## 2014-03-09 NOTE — Telephone Encounter (Signed)
Spoke with Mrs. Layton she will relay the message to Anthony Moran. He has been taking his Mobic and Robaxin. He may increase the Percocet to 4 times a day. Also instructed to call the office when he has about 16 tablets left and a new script will be dispersed. She verbalizes understanding.

## 2014-03-15 ENCOUNTER — Other Ambulatory Visit: Payer: Self-pay | Admitting: Registered Nurse

## 2014-03-15 DIAGNOSIS — M5440 Lumbago with sciatica, unspecified side: Secondary | ICD-10-CM

## 2014-03-16 ENCOUNTER — Ambulatory Visit (HOSPITAL_COMMUNITY)
Admission: RE | Admit: 2014-03-16 | Discharge: 2014-03-16 | Disposition: A | Discharge status: Home / Self Care | Payer: Medicaid Other | Source: Ambulatory Visit | Attending: Registered Nurse | Admitting: Registered Nurse

## 2014-03-16 DIAGNOSIS — M5440 Lumbago with sciatica, unspecified side: Secondary | ICD-10-CM

## 2014-03-16 DIAGNOSIS — Z9889 Other specified postprocedural states: Secondary | ICD-10-CM | POA: Insufficient documentation

## 2014-03-16 DIAGNOSIS — M5116 Intervertebral disc disorders with radiculopathy, lumbar region: Secondary | ICD-10-CM | POA: Insufficient documentation

## 2014-03-16 DIAGNOSIS — M5441 Lumbago with sciatica, right side: Secondary | ICD-10-CM | POA: Diagnosis present

## 2014-03-16 DIAGNOSIS — M4696 Unspecified inflammatory spondylopathy, lumbar region: Secondary | ICD-10-CM | POA: Diagnosis not present

## 2014-03-16 DIAGNOSIS — M4806 Spinal stenosis, lumbar region: Secondary | ICD-10-CM | POA: Insufficient documentation

## 2014-03-18 ENCOUNTER — Telehealth: Payer: Self-pay | Admitting: Physical Medicine & Rehabilitation

## 2014-03-18 ENCOUNTER — Telehealth: Payer: Self-pay | Admitting: Registered Nurse

## 2014-03-18 NOTE — Telephone Encounter (Signed)
Pt's wife called in reference of the appointment, she states her husband is several pain. She would like to touch base with you personally. Thanks  She states, you are able to reach her at [phone] or [phone]

## 2014-03-18 NOTE — Telephone Encounter (Signed)
Spoke with [name] regarding his MRI Results. Was in contact with Dr. Letta Pate as well. We will see if patient will benefit form [facility]. I spoke to Mr. Gittins regarding Dr. Letta Pate recommendation and he is in agreement. Patient will be scheduled for L1,2,3 Bilateral MBB. I will have office call with the date.

## 2014-03-19 NOTE — Telephone Encounter (Signed)
Call was placed to Mrs.Mccormac, No answer left message to return the call at her convience

## 2014-03-25 ENCOUNTER — Telehealth: Payer: Self-pay | Admitting: Registered Nurse

## 2014-03-25 NOTE — Telephone Encounter (Signed)
Mrs. Branch called today on behalf of her husband pain. She states he is having excruciating pain. We increased his medications 2 weeks aso and he is schedule for MBB. She verbalized understanding. We will continue with this treatment modality. She verbalizes understanding.

## 2014-03-31 ENCOUNTER — Other Ambulatory Visit: Payer: Self-pay | Admitting: *Deleted

## 2014-03-31 MED ORDER — OXYCODONE-ACETAMINOPHEN 10-325 MG PO TABS: 1.0000 | ORAL_TABLET | Freq: Three times a day (TID) | ORAL | Status: DC

## 2014-03-31 MED ORDER — OXYMORPHONE HCL ER 20 MG PO TB12: 20.0000 mg | ORAL_TABLET | Freq: Two times a day (BID) | ORAL | Status: DC

## 2014-03-31 NOTE — Telephone Encounter (Signed)
Narcotic rx printed for MD to sign for RN med refill visit 

## 2014-04-01 ENCOUNTER — Telehealth: Payer: Self-pay | Admitting: *Deleted

## 2014-04-01 ENCOUNTER — Encounter: Payer: Medicaid Other | Admitting: Registered Nurse

## 2014-04-01 NOTE — Telephone Encounter (Signed)
Anthony Moran called on behalf of [name]. 1st to inform that Anthony Moran was not going to be able to make his appt. today (04/01/2014).  2nd to inform that Anthony Moran has been taking 4 pills of oxycodone a day instead of 3 pills a day.  She says Anthony Moran has 2 pills left of oxycodone and 16 pills left of Opana. She is asking for help, please advise

## 2014-04-02 ENCOUNTER — Telehealth: Payer: Self-pay | Admitting: Physical Medicine & Rehabilitation

## 2014-04-02 NOTE — Telephone Encounter (Signed)
I spoke to Ruford Dudzinski (wife of patient) this evening. She came to the office to pick up rx for opana ER and percocet rx's.  According to the wife, she arrived at 4:22 and nobody was in our office. When I spoke with her shortly after 5pm, she was still standing in the hallway in front of our office. I will write new rx'es for Mr. Humble and his wife will pick them up at the front desk of [facility] (rehab unit)

## 2014-04-05 MED ORDER — OXYMORPHONE HCL ER 20 MG PO TB12: 20.0000 mg | ORAL_TABLET | Freq: Two times a day (BID) | ORAL | Status: DC

## 2014-04-05 MED ORDER — OXYCODONE-ACETAMINOPHEN 10-325 MG PO TABS: 1.0000 | ORAL_TABLET | Freq: Four times a day (QID) | ORAL | Status: DC | PRN

## 2014-04-05 NOTE — Telephone Encounter (Signed)
Anthony Moran brought back the hand written rxs Dr Naaman Plummer gave her over the weekend and picked up the epic printed rxs what she had come to pick up originally.  After getting home she has called back speaking with [name] stating that he was ok'd to increase to 4 percocet each day.  The rx was for tid.  We are requesting that she bring back his perscriptions for both opana and percocet so that they can be reprinted for the correct quantity of the percocet #120(4xday).

## 2014-04-15 ENCOUNTER — Encounter: Payer: Medicaid Other | Attending: Physical Medicine & Rehabilitation

## 2014-04-15 ENCOUNTER — Ambulatory Visit (HOSPITAL_BASED_OUTPATIENT_CLINIC_OR_DEPARTMENT_OTHER): Payer: Medicaid Other | Admitting: Physical Medicine & Rehabilitation

## 2014-04-15 ENCOUNTER — Encounter: Payer: Self-pay | Admitting: Physical Medicine & Rehabilitation

## 2014-04-15 VITALS — BP 160/86 | HR 85 | Resp 14 | Ht 70.0 in | Wt 226.0 lb

## 2014-04-15 DIAGNOSIS — M961 Postlaminectomy syndrome, not elsewhere classified: Secondary | ICD-10-CM | POA: Insufficient documentation

## 2014-04-15 DIAGNOSIS — E1141 Type 2 diabetes mellitus with diabetic mononeuropathy: Secondary | ICD-10-CM

## 2014-04-15 DIAGNOSIS — G587 Mononeuritis multiplex: Secondary | ICD-10-CM

## 2014-04-15 DIAGNOSIS — M545 Low back pain: Secondary | ICD-10-CM | POA: Diagnosis present

## 2014-04-15 DIAGNOSIS — Z5181 Encounter for therapeutic drug level monitoring: Secondary | ICD-10-CM | POA: Insufficient documentation

## 2014-04-15 DIAGNOSIS — Z79899 Other long term (current) drug therapy: Secondary | ICD-10-CM | POA: Diagnosis present

## 2014-04-15 DIAGNOSIS — M4802 Spinal stenosis, cervical region: Secondary | ICD-10-CM

## 2014-04-15 MED ORDER — DULOXETINE HCL 60 MG PO CPEP: ORAL_CAPSULE | ORAL | Status: DC

## 2014-04-15 MED ORDER — PREGABALIN 100 MG PO CAPS: 100.0000 mg | ORAL_CAPSULE | Freq: Three times a day (TID) | ORAL | Status: DC

## 2014-04-15 NOTE — Progress Notes (Signed)
Subjective:    Patient ID: Anthony Moran, male    DOB: 1950-08-21, 63 y.o.   MRN: [id number]  HPI Chief complaint is increasing back and thigh pain  Thigh pain is intermittent and severe, back pain is lower grade and more constant. Currently taking Opana ER 20 mg twice a day Percocet 10 mg 4 times a day, this was increased from 3 times a day last month Patient states that medication change did not help much.  His primary complaint is the thigh pain which is intense severe. He is already taking Cymbalta 60 mg per day as well as Lyrica, 100 mg 3 times a day  No falls, no constant numbness in the thigh  Has been seen by neurosurgery. Repeat MRI shows some degeneration at L3-L4 above the level of the L4-5 L5-S1 fusion but no significant nerve root compression  Pain Inventory Average Pain 9 Pain Right Now 8 My pain is sharp, burning, dull, stabbing and aching  In the last 24 hours, has pain interfered with the following? General activity 10 Relation with others 7 Enjoyment of life 10 What TIME of day is your pain at its worst? morning Sleep (in general) Poor  Pain is worse with: walking, bending, sitting, inactivity and standing Pain improves with: rest, heat/ice and medication Relief from Meds: 2  Mobility walk without assistance ability to climb steps?  no do you drive?  no transfers alone Do you have any goals in this area?  yes  Function disabled: date disabled ? I need assistance with the following:  feeding, dressing, bathing, toileting, meal prep, household duties and shopping Do you have any goals in this area?  yes  Neuro/Psych weakness numbness tremor trouble walking spasms dizziness confusion depression anxiety loss of taste or smell  Prior Studies Any changes since last visit?  no  Physicians involved in your care Any changes since last visit?  no   Family History  Problem Relation Age of Onset  . Breast cancer Maternal Aunt   . Prostate  cancer Father   . Hypertension Sister    History   Social History  . Marital Status: Married    Spouse Name: N/A    Number of Children: 3  . Years of Education: N/A   Occupational History  .     Social History Main Topics  . Smoking status: Never Smoker   . Smokeless tobacco: Never Used  . Alcohol Use: No     Comment: rare  . Drug Use: No  . Sexual Activity: None   Other Topics Concern  . None   Social History Narrative   Past Surgical History  Procedure Laterality Date  . Lumbar fusion      L 4-5, L5 si   Past Medical History  Diagnosis Date  . Hypertension   . Diabetes mellitus   . Anxiety   . Depression   . Arthritis   . Asthma   . Cervical spondylosis without myelopathy   . Spinal stenosis in cervical region   . Lumbar post-laminectomy syndrome   . Lumbosacral neuritis   . Mononeuritis multiplex    BP 160/86 mmHg  Pulse 85  Resp 14  Ht 5\' 10"  (1.778 m)  Wt 226 lb (102.513 kg)  BMI 32.43 kg/m2  SpO2 99%  Opioid Risk Score:   Fall Risk Score: Low Fall Risk (0-5 points)  Review of Systems     Objective:   Physical Exam  Constitutional: He appears well-developed and well-nourished.  HENT:  Head: Normocephalic and atraumatic.  Eyes: Conjunctivae are normal. Pupils are equal, round, and reactive to light.  Neurological: He is alert. Gait normal.  Psychiatric:  Irritable during discussion about medication recommendations as well as recommendation for injection          Assessment & Plan:  1. Lumbar postlaminectomy syndrome with chronic pain. His repeat MRI did not show any significant changes. Symptomatically he is developing more of a right L3 radiculopathy but he has had no weakness in the leg only intermittent shooting pain.  We discussed treatment options including epidural steroid injection right L3, he like to think about this   We discussed medication management including increasing Opana ER to 30 g twice a day and reducing the  Percocet to 10 mg 3 times a day. Patient would like to increase his Opana ER as well as keep his Percocet 10 mg 4 times a day. I did not want to do this because it would be too big of a jump of medication for him.  In addition we discussed that the patient may eventually need repeat surgery. He is still thinking about this.  Nurse practitioner visit one month he will think about our conversation and determine whether he wants to increase his Opana ER and reduce the Percocet as noted above  Majority of 15 minute visit was spent discussing medications, treatment plans, recommendation for injection, going over the location of the injection as compared with prior injection of right S1 performed several years ago

## 2014-04-15 NOTE — Patient Instructions (Signed)
We discussed medication changes, my recommendation if he would like to change would be to increase Opana ER to 30 mg twice a day and reduce Percocet 10 mg to 3 times a day  I also recommended right L3 transforaminal epidural injection to block the nerve pain in the right thigh  Please let us know if you decide to have surgery so we can coordinate with Dr. Arnoldo Morale

## 2014-05-03 ENCOUNTER — Ambulatory Visit (HOSPITAL_BASED_OUTPATIENT_CLINIC_OR_DEPARTMENT_OTHER): Payer: Medicaid Other | Admitting: Physical Medicine & Rehabilitation

## 2014-05-03 ENCOUNTER — Ambulatory Visit: Payer: Medicaid Other | Admitting: Physical Medicine & Rehabilitation

## 2014-05-03 ENCOUNTER — Encounter: Payer: Self-pay | Admitting: Physical Medicine & Rehabilitation

## 2014-05-03 ENCOUNTER — Other Ambulatory Visit: Payer: Self-pay | Admitting: Physical Medicine & Rehabilitation

## 2014-05-03 VITALS — HR 78 | Resp 14 | Ht 70.0 in | Wt 236.0 lb

## 2014-05-03 DIAGNOSIS — M961 Postlaminectomy syndrome, not elsewhere classified: Secondary | ICD-10-CM | POA: Diagnosis not present

## 2014-05-03 DIAGNOSIS — Z79899 Other long term (current) drug therapy: Secondary | ICD-10-CM

## 2014-05-03 DIAGNOSIS — M5416 Radiculopathy, lumbar region: Secondary | ICD-10-CM

## 2014-05-03 DIAGNOSIS — M5417 Radiculopathy, lumbosacral region: Secondary | ICD-10-CM

## 2014-05-03 DIAGNOSIS — G894 Chronic pain syndrome: Secondary | ICD-10-CM

## 2014-05-03 DIAGNOSIS — Z5181 Encounter for therapeutic drug level monitoring: Secondary | ICD-10-CM

## 2014-05-03 MED ORDER — OXYMORPHONE HCL ER 30 MG PO TB12: 30.0000 mg | ORAL_TABLET | Freq: Two times a day (BID) | ORAL | Status: DC

## 2014-05-03 MED ORDER — OXYCODONE-ACETAMINOPHEN 10-325 MG PO TABS: 1.0000 | ORAL_TABLET | Freq: Three times a day (TID) | ORAL | Status: DC | PRN

## 2014-05-03 NOTE — Progress Notes (Signed)
Subjective:    Patient ID: Anthony Moran, male    DOB: Jun 13, 1950, 63 y.o.   MRN: [id number]  HPI Patient continued to complain about back pain.His back pain is constant. His right thigh pain is intermittent. No new bowel or bladder issues. Patient recalls conversation from last month regarding changes to medication management.  We also discussed the cause of the thigh pain which appears to be radicular. Pain Inventory Average Pain 9 Pain Right Now 9 My pain is constant, sharp, burning, dull, stabbing, tingling and aching  In the last 24 hours, has pain interfered with the following? General activity 10 Relation with others 9 Enjoyment of life 10 What TIME of day is your pain at its worst? morning Sleep (in general) Fair  Pain is worse with: walking, bending, sitting, inactivity, standing and some activites Pain improves with: rest, heat/ice and medication Relief from Meds: 3  Mobility use a cane how many minutes can you walk? 5-10  Function disabled: date disabled .  Neuro/Psych weakness numbness tremor tingling trouble walking spasms confusion depression anxiety  Prior Studies Any changes since last visit?  no  Physicians involved in your care Any changes since last visit?  no   Family History  Problem Relation Age of Onset  . Breast cancer Maternal Aunt   . Prostate cancer Father   . Hypertension Sister    History   Social History  . Marital Status: Married    Spouse Name: N/A    Number of Children: 3  . Years of Education: N/A   Occupational History  .     Social History Main Topics  . Smoking status: Never Smoker   . Smokeless tobacco: Never Used  . Alcohol Use: No     Comment: rare  . Drug Use: No  . Sexual Activity: None   Other Topics Concern  . None   Social History Narrative   Past Surgical History  Procedure Laterality Date  . Lumbar fusion      L 4-5, L5 si   Past Medical History  Diagnosis Date  . Hypertension     . Diabetes mellitus   . Anxiety   . Depression   . Arthritis   . Asthma   . Cervical spondylosis without myelopathy   . Spinal stenosis in cervical region   . Lumbar post-laminectomy syndrome   . Lumbosacral neuritis   . Mononeuritis multiplex    Pulse 78  Resp 14  Ht 5\' 10"  (1.778 m)  Wt 236 lb (107.049 kg)  BMI 33.86 kg/m2  SpO2 96%  Opioid Risk Score:   Fall Risk Score: Moderate Fall Risk (6-13 points)  Review of Systems  HENT: Negative.   Eyes: Negative.   Respiratory: Negative.   Cardiovascular: Negative.   Gastrointestinal: Negative.   Endocrine: Negative.   Genitourinary: Negative.   Musculoskeletal: Positive for myalgias, back pain and arthralgias.  Skin: Negative.   Allergic/Immunologic: Negative.   Neurological: Positive for tremors, weakness and numbness.  Hematological: Negative.   Psychiatric/Behavioral: Positive for dysphoric mood. The patient is nervous/anxious.        Objective:   Physical Exam  Constitutional: He is oriented to person, place, and time. He appears well-developed and well-nourished.  HENT:  Head: Normocephalic.  Eyes: Pupils are equal, round, and reactive to light.  Neck: Normal range of motion.  Neurological: He is alert and oriented to person, place, and time. He displays no atrophy. Gait abnormal.  Reflex Scores:  Patellar reflexes are 2+ on the right side and 2+ on the left side.      Achilles reflexes are 0 on the right side and 2+ on the left side. Forward flex  Psychiatric: He has a normal mood and affect.  Nursing note and vitals reviewed.  Decreased sensation in the right L3 dermatomal distribution Motor strength is 4/5 bilateral knee extension 4/5 bilateral ankle dorsiflexion       Assessment & Plan:  1. Lumbar postlaminectomy syndrome with chronic pain. His repeat MRI did not show any significant changes. Symptomatically he is developing more of a right L3 radiculopathy but he has had no weakness in the leg  only intermittent shooting pain.  We discussed treatment options including epidural steroid injection right L3, he like to think about this  We discussed medication management including increasing Opana ER to 30 g twice a day and reducing the Percocet to 10 mg 3 times a day. He would like to proceed with this.  In addition we discussed that the patient may eventually need repeat surgery. He is still thinking about this.

## 2014-05-04 LAB — PMP ALCOHOL METABOLITE (ETG): ETGU: NEGATIVE ng/mL

## 2014-05-05 LAB — OPIATES/OPIOIDS (LC/MS-MS)
CODEINE URINE: NEGATIVE ng/mL (ref <50)
HYDROCODONE: NEGATIVE ng/mL (ref <50)
Hydromorphone: NEGATIVE ng/mL (ref <50)
Morphine Urine: NEGATIVE ng/mL (ref <50)
Norhydrocodone, Ur: NEGATIVE ng/mL (ref <50)
Noroxycodone, Ur: 3084 ng/mL (ref <50)
OXYMORPHONE, URINE: 18180 ng/mL (ref <50)
Oxycodone, ur: 1826 ng/mL (ref <50)

## 2014-05-05 LAB — OXYCODONE, URINE (LC/MS-MS)
Noroxycodone, Ur: 3084 ng/mL (ref <50)
OXYMORPHONE, URINE: 18180 ng/mL (ref <50)
Oxycodone, ur: 1826 ng/mL (ref <50)

## 2014-05-06 LAB — PRESCRIPTION MONITORING PROFILE (SOLSTAS)
Amphetamine/Meth: NEGATIVE ng/mL
BUPRENORPHINE, URINE: NEGATIVE ng/mL
Barbiturate Screen, Urine: NEGATIVE ng/mL
Benzodiazepine Screen, Urine: NEGATIVE ng/mL
Cannabinoid Scrn, Ur: NEGATIVE ng/mL
Carisoprodol, Urine: NEGATIVE ng/mL
Cocaine Metabolites: NEGATIVE ng/mL
Creatinine, Urine: 183.23 mg/dL (ref >20.0)
ECSTASY: NEGATIVE ng/mL
Fentanyl, Ur: NEGATIVE ng/mL
Meperidine, Ur: NEGATIVE ng/mL
Methadone Screen, Urine: NEGATIVE ng/mL
Nitrites, Initial: NEGATIVE ug/mL
PH URINE, INITIAL: 5.6 pH (ref 4.5–8.9)
Propoxyphene: NEGATIVE ng/mL
Tapentadol, urine: NEGATIVE ng/mL
Tramadol Scrn, Ur: NEGATIVE ng/mL
Zolpidem, Urine: NEGATIVE ng/mL

## 2014-05-19 NOTE — Progress Notes (Signed)
Urine drug screen done this encounter is consistent for prescribed medications.

## 2014-05-20 NOTE — Progress Notes (Signed)
Urine drug screen for this encounter is inconsistent.  Positive for medications prescribed by our office ([place] and Percocet) but was positive for hydromorphone (dilaudid) which is not prescribed by our office. There is no indicator as to where this medication came from.

## 2014-05-28 ENCOUNTER — Ambulatory Visit: Payer: Medicaid Other | Admitting: Physical Medicine & Rehabilitation

## 2014-06-08 ENCOUNTER — Encounter: Payer: Medicaid Other | Attending: Physical Medicine & Rehabilitation | Admitting: *Deleted

## 2014-06-08 ENCOUNTER — Encounter: Payer: Self-pay | Admitting: *Deleted

## 2014-06-08 DIAGNOSIS — M545 Low back pain: Secondary | ICD-10-CM | POA: Diagnosis present

## 2014-06-08 DIAGNOSIS — Z79899 Other long term (current) drug therapy: Secondary | ICD-10-CM | POA: Diagnosis present

## 2014-06-08 DIAGNOSIS — M47812 Spondylosis without myelopathy or radiculopathy, cervical region: Secondary | ICD-10-CM

## 2014-06-08 DIAGNOSIS — M961 Postlaminectomy syndrome, not elsewhere classified: Secondary | ICD-10-CM | POA: Diagnosis not present

## 2014-06-08 DIAGNOSIS — M4802 Spinal stenosis, cervical region: Secondary | ICD-10-CM

## 2014-06-08 DIAGNOSIS — G894 Chronic pain syndrome: Secondary | ICD-10-CM

## 2014-06-08 DIAGNOSIS — Z5181 Encounter for therapeutic drug level monitoring: Secondary | ICD-10-CM | POA: Diagnosis present

## 2014-06-08 DIAGNOSIS — M47817 Spondylosis without myelopathy or radiculopathy, lumbosacral region: Secondary | ICD-10-CM

## 2014-06-08 MED ORDER — OXYCODONE-ACETAMINOPHEN 10-325 MG PO TABS: 1.0000 | ORAL_TABLET | Freq: Three times a day (TID) | ORAL | Status: DC | PRN

## 2014-06-08 MED ORDER — OXYMORPHONE HCL ER 30 MG PO TB12: 30.0000 mg | ORAL_TABLET | Freq: Two times a day (BID) | ORAL | Status: DC

## 2014-06-08 NOTE — Progress Notes (Signed)
Here for med refill due to scheduled to be out of meds before fu appt with Kirsteins 06/14/14.  His pill counts are appropriate though he says that even though Dr Letta Pate increased the opana er to 30 mg #60 but he went down on the percocet 10/325 to tid.  He is stillhurting.  He is going to see Dr Arnoldo Morale 06/15/14 about surgery.  He has had no falls and was educated and given handout 08/03/13 about fall prevention in the home.  No travel outside of the [place] in past 21 days and he has been given AD materials but does not have advanced directive in place.  He will return in a month around 1/20-22 to see NP for refill (if he has not had surgery).  No other changes to med list or to condition.

## 2014-06-14 ENCOUNTER — Ambulatory Visit: Payer: Medicaid Other | Admitting: Physical Medicine & Rehabilitation

## 2014-06-16 ENCOUNTER — Other Ambulatory Visit (HOSPITAL_COMMUNITY): Payer: Self-pay | Admitting: Neurosurgery

## 2014-06-28 ENCOUNTER — Encounter (HOSPITAL_COMMUNITY): Payer: Self-pay

## 2014-06-28 ENCOUNTER — Other Ambulatory Visit: Payer: Self-pay

## 2014-06-28 ENCOUNTER — Encounter (HOSPITAL_COMMUNITY)
Admission: RE | Admit: 2014-06-28 | Discharge: 2014-06-28 | Disposition: A | Discharge status: Home / Self Care | Payer: Medicaid Other | Source: Ambulatory Visit | Attending: Neurosurgery | Admitting: Neurosurgery

## 2014-06-28 LAB — CBC
HEMATOCRIT: 32.4 % — AB (ref 39.0–52.0)
Hemoglobin: 10.3 g/dL — ABNORMAL LOW (ref 13.0–17.0)
MCH: 25.2 pg — AB (ref 26.0–34.0)
MCHC: 31.8 g/dL (ref 30.0–36.0)
MCV: 79.4 fL (ref 78.0–100.0)
PLATELETS: 167 10*3/uL (ref 150–400)
RBC: 4.08 MIL/uL — ABNORMAL LOW (ref 4.22–5.81)
RDW: 13.8 % (ref 11.5–15.5)
WBC: 5.2 10*3/uL (ref 4.0–10.5)

## 2014-06-28 LAB — BASIC METABOLIC PANEL
Anion gap: 9 (ref 5–15)
BUN: 22 mg/dL (ref 6–23)
CO2: 27 mmol/L (ref 19–32)
Calcium: 9.3 mg/dL (ref 8.4–10.5)
Chloride: 102 mEq/L (ref 96–112)
Creatinine, Ser: 1.89 mg/dL — ABNORMAL HIGH (ref 0.50–1.35)
GFR calc non Af Amer: 36 mL/min — ABNORMAL LOW (ref >90)
GFR, EST AFRICAN AMERICAN: 42 mL/min — AB (ref >90)
Glucose, Bld: 120 mg/dL — ABNORMAL HIGH (ref 70–99)
Potassium: 4.1 mmol/L (ref 3.5–5.1)
Sodium: 138 mmol/L (ref 135–145)

## 2014-06-28 LAB — TYPE AND SCREEN
ABO/RH(D): B POS
Antibody Screen: NEGATIVE

## 2014-06-28 LAB — ABO/RH: ABO/RH(D): B POS

## 2014-06-28 LAB — SURGICAL PCR SCREEN
MRSA, PCR: NEGATIVE
Staphylococcus aureus: POSITIVE — AB

## 2014-06-28 NOTE — Progress Notes (Signed)
   06/28/14 1308  OBSTRUCTIVE SLEEP APNEA  Have you ever been diagnosed with sleep apnea through a sleep study? No  Do you snore loudly (loud enough to be heard through closed doors)?  0  Do you often feel tired, fatigued, or sleepy during the daytime? 1 (Medication induced)  Has anyone observed you stop breathing during your sleep? 0  Do you have, or are you being treated for high blood pressure? 1  BMI more than 35 kg/m2? 0  Age over 63 years old? 1  Neck circumference greater than 40 cm/16 inches? 0  Gender: 1  Obstructive Sleep Apnea Score 4  Score 4 or greater  Results sent to PCP

## 2014-06-28 NOTE — Pre-Procedure Instructions (Signed)
Josias Tomerlin  06/28/2014   Your procedure is scheduled on:  Monday, Jan. 25th   Report to [facility] Admitting at 7:00 AM.             (Arrival time is per your surgeon's request)   Call this number if you have problems the morning of surgery: [phone]   Remember:   Do not eat food or drink liquids after midnight Sunday.    Take these medicines the morning of surgery with A SIP OF WATER: Norvasc, Cymbalta, Pain Medication.              DO NOT take any diabetes medication the morning of surgery.   Do not wear jewelry - no rings or watches.  Do not wear lotions or colognes.  You may NOT wear deodorant the day of surgery.   Men may shave face and neck.   Do not bring valuables to the hospital.  [facility] is not responsible for any belongings or valuables.               Contacts, dentures or bridgework may not be worn into surgery.  Leave suitcase in the car. After surgery it may be brought to your room.  For patients admitted to the hospital, discharge time is determined by your treatment team.    Name and phone number of your driver:    Special Instructions: "Preparing for Surgery" instruction sheet.   Please read over the following fact sheets that you were given: Pain Booklet, Coughing and Deep Breathing, Blood Transfusion Information, MRSA Information and Surgical Site Infection Prevention

## 2014-06-28 NOTE — Progress Notes (Addendum)
Have requested ekg (2007?) for comparison, from Dr. Patric Dykes office. [phone]   DA

## 2014-06-28 NOTE — Progress Notes (Signed)
Patient denies any cardiac issues.  Has never seen a cardio.  DA

## 2014-06-29 NOTE — Progress Notes (Addendum)
Anesthesia Chart Review:  Pt is 64 year old male scheduled for L3-4 PLIF on 07/05/2014 with Dr. Arnoldo Morale.   PMH includes: HTN, DM, asthma. BMI 34  Preoperative labs reviewed.  H/H 10.3/32.4. Cr 1.89.   EKG: NSR.   Willeen Cass, FNP-BC [facility]/Anesthesiology Phone: [phone] 06/29/2014 4:35 PM  Addendum:  Received labs from September 2015 from pt's PCP's (Dr. Lucianne Lei) office. Cr at that time was 2.35. Contacted pt who was unaware he had any kidney problems and has never seen nephrologist. Contacted Dr. Fransico Setters office. They report pt has missed last few follow up appointments with Dr. Criss Rosales. Dr. Criss Rosales feels pt needs medical clearance prior to surgery and he needs to be seen for clearance. Pt's wife notified as I was not able to get in touch with pt. Dr. Fransico Setters office also contacted pt's wife. Notified Manuela Schwartz in Dr. Arnoldo Morale office that pt needs medical clearance prior to surgery.   Willeen Cass, FNP-BC [facility]/Anesthesiology Phone: [phone] 07/01/2014 2:37 PM  Addendum:  Patient was seen by Dr. Criss Rosales yesterday after 5 PM.  Their office is closed today, but I was able to page Dr. Criss Rosales and speak with her over the phone.  We discussed labs which she said were stable and actually a little improved when compared to prior labs at her office. She felt he could proceed with surgery on 07/05/14 as planned, and she will continue to follow his renal function on an out-patient basis. Also discussed with anesthesiologist Dr. Linna Caprice. Patient will need close monitoring of his renal function post-operatively.  George Hugh [facility] Short Stay Center/Anesthesiology Phone [phone] 07/02/2014 2:49 PM

## 2014-07-01 ENCOUNTER — Telehealth: Payer: Self-pay | Admitting: *Deleted

## 2014-07-01 NOTE — Telephone Encounter (Signed)
Patient's wife called to cancel Friday's appt. Because of the weather and also he is scheduled to have surgery on Monday January 25, 16.  She asked about his refills, I told her that the surgeon is allowed to write an RX for his pain meds and they will reschedule once he is able. FYI

## 2014-07-02 ENCOUNTER — Encounter: Payer: Medicaid Other | Admitting: Registered Nurse

## 2014-07-04 MED ORDER — CEFAZOLIN SODIUM-DEXTROSE 2-3 GM-% IV SOLR
2.0000 g | INTRAVENOUS | Status: AC
  Administered 2014-07-05: 2 g via INTRAVENOUS
  Filled 2014-07-04: qty 50

## 2014-07-05 ENCOUNTER — Inpatient Hospital Stay (HOSPITAL_COMMUNITY): Payer: Medicaid Other

## 2014-07-05 ENCOUNTER — Encounter (HOSPITAL_COMMUNITY): Payer: Self-pay | Admitting: *Deleted

## 2014-07-05 ENCOUNTER — Inpatient Hospital Stay (HOSPITAL_COMMUNITY): Payer: Medicaid Other | Admitting: Emergency Medicine

## 2014-07-05 ENCOUNTER — Inpatient Hospital Stay (HOSPITAL_COMMUNITY): Payer: Medicaid Other | Admitting: Certified Registered Nurse Anesthetist

## 2014-07-05 ENCOUNTER — Inpatient Hospital Stay (HOSPITAL_COMMUNITY)
Admission: RE | Admit: 2014-07-05 | Discharge: 2014-07-06 | DRG: 460 | Disposition: A | Discharge status: Home / Self Care | Payer: Medicaid Other | Source: Ambulatory Visit | Attending: Neurosurgery | Admitting: Neurosurgery

## 2014-07-05 ENCOUNTER — Encounter (HOSPITAL_COMMUNITY)
Admission: RE | Disposition: A | Discharge status: Home / Self Care | Payer: Medicaid Other | Source: Ambulatory Visit | Attending: Neurosurgery

## 2014-07-05 DIAGNOSIS — M5489 Other dorsalgia: Secondary | ICD-10-CM | POA: Diagnosis present

## 2014-07-05 DIAGNOSIS — M4806 Spinal stenosis, lumbar region: Secondary | ICD-10-CM | POA: Diagnosis present

## 2014-07-05 DIAGNOSIS — E119 Type 2 diabetes mellitus without complications: Secondary | ICD-10-CM | POA: Diagnosis present

## 2014-07-05 DIAGNOSIS — I10 Essential (primary) hypertension: Secondary | ICD-10-CM | POA: Diagnosis present

## 2014-07-05 DIAGNOSIS — M5116 Intervertebral disc disorders with radiculopathy, lumbar region: Secondary | ICD-10-CM | POA: Diagnosis present

## 2014-07-05 DIAGNOSIS — Z981 Arthrodesis status: Secondary | ICD-10-CM | POA: Diagnosis not present

## 2014-07-05 DIAGNOSIS — M4316 Spondylolisthesis, lumbar region: Secondary | ICD-10-CM | POA: Diagnosis present

## 2014-07-05 DIAGNOSIS — M4310 Spondylolisthesis, site unspecified: Secondary | ICD-10-CM

## 2014-07-05 LAB — GLUCOSE, CAPILLARY
GLUCOSE-CAPILLARY: 131 mg/dL — AB (ref 70–99)
Glucose-Capillary: 185 mg/dL — ABNORMAL HIGH (ref 70–99)
Glucose-Capillary: 174 mg/dL — ABNORMAL HIGH (ref 70–99)

## 2014-07-05 SURGERY — POSTERIOR LUMBAR FUSION 1 LEVEL
Anesthesia: General | Site: Spine Lumbar

## 2014-07-05 MED ORDER — DIAZEPAM 5 MG PO TABS
5.0000 mg | ORAL_TABLET | Freq: Four times a day (QID) | ORAL | Status: DC | PRN
  Administered 2014-07-06 (×2): 5 mg via ORAL
  Filled 2014-07-05 (×2): qty 1

## 2014-07-05 MED ORDER — MIDAZOLAM HCL 5 MG/5ML IJ SOLN
INTRAMUSCULAR | Status: DC | PRN
Start: 2014-07-05 — End: 2014-07-05
  Administered 2014-07-05: 2 mg via INTRAVENOUS

## 2014-07-05 MED ORDER — LORATADINE 10 MG PO TABS
10.0000 mg | ORAL_TABLET | Freq: Every day | ORAL | Status: DC
  Filled 2014-07-05 (×2): qty 1

## 2014-07-05 MED ORDER — METFORMIN HCL 850 MG PO TABS
850.0000 mg | ORAL_TABLET | Freq: Two times a day (BID) | ORAL | Status: DC
Start: 2014-07-06 — End: 2014-07-05

## 2014-07-05 MED ORDER — NEOSTIGMINE METHYLSULFATE 10 MG/10ML IV SOLN
INTRAVENOUS | Status: DC | PRN
  Administered 2014-07-05: 4 mg via INTRAVENOUS

## 2014-07-05 MED ORDER — MENTHOL 3 MG MT LOZG: 1.0000 | LOZENGE | OROMUCOSAL | Status: DC | PRN

## 2014-07-05 MED ORDER — FENTANYL CITRATE 0.05 MG/ML IJ SOLN
INTRAMUSCULAR | Status: AC
  Filled 2014-07-05: qty 5

## 2014-07-05 MED ORDER — OXYCODONE-ACETAMINOPHEN 5-325 MG PO TABS
1.0000 | ORAL_TABLET | ORAL | Status: DC | PRN
  Administered 2014-07-06 (×3): 2 via ORAL
  Filled 2014-07-05 (×3): qty 2

## 2014-07-05 MED ORDER — METHOCARBAMOL 500 MG PO TABS
500.0000 mg | ORAL_TABLET | Freq: Three times a day (TID) | ORAL | Status: DC
  Administered 2014-07-05: 500 mg via ORAL
  Filled 2014-07-05 (×4): qty 1

## 2014-07-05 MED ORDER — TAMSULOSIN HCL 0.4 MG PO CAPS
0.4000 mg | ORAL_CAPSULE | Freq: Every day | ORAL | Status: DC
  Administered 2014-07-05: 0.4 mg via ORAL
  Filled 2014-07-05 (×2): qty 1

## 2014-07-05 MED ORDER — PREGABALIN 100 MG PO CAPS
100.0000 mg | ORAL_CAPSULE | Freq: Three times a day (TID) | ORAL | Status: DC
  Administered 2014-07-05: 100 mg via ORAL
  Filled 2014-07-05: qty 1

## 2014-07-05 MED ORDER — GLYCOPYRROLATE 0.2 MG/ML IJ SOLN
INTRAMUSCULAR | Status: DC | PRN
  Administered 2014-07-05: .1 mg via INTRAVENOUS
  Administered 2014-07-05: 0.6 mg via INTRAVENOUS

## 2014-07-05 MED ORDER — PHENYLEPHRINE HCL 10 MG/ML IJ SOLN
INTRAMUSCULAR | Status: DC | PRN
  Administered 2014-07-05: 40 ug via INTRAVENOUS
  Administered 2014-07-05 (×2): 80 ug via INTRAVENOUS
  Administered 2014-07-05: 40 ug via INTRAVENOUS

## 2014-07-05 MED ORDER — INSULIN ASPART 100 UNIT/ML ~~LOC~~ SOLN: 0.0000 [IU] | SUBCUTANEOUS | Status: DC

## 2014-07-05 MED ORDER — IRBESARTAN 300 MG PO TABS
300.0000 mg | ORAL_TABLET | Freq: Every day | ORAL | Status: DC
  Filled 2014-07-05: qty 1

## 2014-07-05 MED ORDER — CEFAZOLIN SODIUM-DEXTROSE 2-3 GM-% IV SOLR
2.0000 g | Freq: Three times a day (TID) | INTRAVENOUS | Status: AC
  Administered 2014-07-05 – 2014-07-06 (×2): 2 g via INTRAVENOUS
  Filled 2014-07-05 (×2): qty 50

## 2014-07-05 MED ORDER — ACETAMINOPHEN 325 MG PO TABS: 650.0000 mg | ORAL_TABLET | ORAL | Status: DC | PRN

## 2014-07-05 MED ORDER — POLYETHYLENE GLYCOL 3350 17 G PO PACK
17.0000 g | PACK | Freq: Every day | ORAL | Status: DC | PRN
  Filled 2014-07-05: qty 1

## 2014-07-05 MED ORDER — PROPOFOL 10 MG/ML IV BOLUS
INTRAVENOUS | Status: AC
  Filled 2014-07-05: qty 20

## 2014-07-05 MED ORDER — BUPIVACAINE LIPOSOME 1.3 % IJ SUSP
20.0000 mL | INTRAMUSCULAR | Status: DC
  Filled 2014-07-05: qty 20

## 2014-07-05 MED ORDER — DOCUSATE SODIUM 100 MG PO CAPS
100.0000 mg | ORAL_CAPSULE | Freq: Two times a day (BID) | ORAL | Status: DC
  Administered 2014-07-05: 100 mg via ORAL
  Filled 2014-07-05: qty 1

## 2014-07-05 MED ORDER — ARTIFICIAL TEARS OP OINT
TOPICAL_OINTMENT | OPHTHALMIC | Status: DC | PRN
  Administered 2014-07-05: 1 via OPHTHALMIC

## 2014-07-05 MED ORDER — 0.9 % SODIUM CHLORIDE (POUR BTL) OPTIME
TOPICAL | Status: DC | PRN
  Administered 2014-07-05: 1000 mL

## 2014-07-05 MED ORDER — HYDROMORPHONE HCL 1 MG/ML IJ SOLN
INTRAMUSCULAR | Status: AC
Start: 2014-07-05 — End: 2014-07-06
  Filled 2014-07-05: qty 1

## 2014-07-05 MED ORDER — KETOROLAC TROMETHAMINE 30 MG/ML IJ SOLN
30.0000 mg | Freq: Once | INTRAMUSCULAR | Status: AC | PRN
  Administered 2014-07-05: 30 mg via INTRAVENOUS

## 2014-07-05 MED ORDER — LOSARTAN POTASSIUM-HCTZ 100-12.5 MG PO TABS: 1.0000 | ORAL_TABLET | Freq: Every day | ORAL | Status: DC

## 2014-07-05 MED ORDER — LACTATED RINGERS IV SOLN
INTRAVENOUS | Status: DC
  Administered 2014-07-05 (×4): via INTRAVENOUS

## 2014-07-05 MED ORDER — SODIUM CHLORIDE 0.9 % IR SOLN
Status: DC | PRN
  Administered 2014-07-05: 12:00:00

## 2014-07-05 MED ORDER — HYDROMORPHONE HCL 1 MG/ML IJ SOLN
0.2500 mg | INTRAMUSCULAR | Status: DC | PRN
  Administered 2014-07-05 (×4): 0.5 mg via INTRAVENOUS

## 2014-07-05 MED ORDER — DIAZEPAM 5 MG/ML IJ SOLN
INTRAMUSCULAR | Status: AC
  Filled 2014-07-05: qty 2

## 2014-07-05 MED ORDER — KETAMINE HCL 100 MG/ML IJ SOLN
INTRAMUSCULAR | Status: AC
  Filled 2014-07-05: qty 1

## 2014-07-05 MED ORDER — DULOXETINE HCL 60 MG PO CPEP
60.0000 mg | ORAL_CAPSULE | Freq: Every day | ORAL | Status: DC
Start: 2014-07-06 — End: 2014-07-06
  Filled 2014-07-05: qty 1

## 2014-07-05 MED ORDER — CLONIDINE HCL 0.3 MG/24HR TD PTWK: 0.3000 mg | MEDICATED_PATCH | TRANSDERMAL | Status: DC

## 2014-07-05 MED ORDER — BUPIVACAINE-EPINEPHRINE (PF) 0.5% -1:200000 IJ SOLN
INTRAMUSCULAR | Status: DC | PRN
  Administered 2014-07-05: 10 mL

## 2014-07-05 MED ORDER — ONDANSETRON HCL 4 MG/2ML IJ SOLN: 4.0000 mg | INTRAMUSCULAR | Status: DC | PRN

## 2014-07-05 MED ORDER — BUPIVACAINE LIPOSOME 1.3 % IJ SUSP
INTRAMUSCULAR | Status: DC | PRN
  Administered 2014-07-05: 20 mL

## 2014-07-05 MED ORDER — PROMETHAZINE HCL 25 MG/ML IJ SOLN: 6.2500 mg | INTRAMUSCULAR | Status: DC | PRN

## 2014-07-05 MED ORDER — FENTANYL CITRATE 0.05 MG/ML IJ SOLN
INTRAMUSCULAR | Status: DC | PRN
  Administered 2014-07-05: 50 ug via INTRAVENOUS
  Administered 2014-07-05: 25 ug via INTRAVENOUS
  Administered 2014-07-05 (×2): 50 ug via INTRAVENOUS
  Administered 2014-07-05: 100 ug via INTRAVENOUS
  Administered 2014-07-05: 25 ug via INTRAVENOUS
  Administered 2014-07-05: 50 ug via INTRAVENOUS
  Administered 2014-07-05: 25 ug via INTRAVENOUS
  Administered 2014-07-05: 100 ug via INTRAVENOUS
  Administered 2014-07-05: 25 ug via INTRAVENOUS

## 2014-07-05 MED ORDER — ACETAMINOPHEN 650 MG RE SUPP: 650.0000 mg | RECTAL | Status: DC | PRN

## 2014-07-05 MED ORDER — PHENOL 1.4 % MT LIQD: 1.0000 | OROMUCOSAL | Status: DC | PRN

## 2014-07-05 MED ORDER — MIDAZOLAM HCL 2 MG/2ML IJ SOLN
INTRAMUSCULAR | Status: AC
  Filled 2014-07-05: qty 2

## 2014-07-05 MED ORDER — MORPHINE SULFATE ER 100 MG PO TBCR
100.0000 mg | EXTENDED_RELEASE_TABLET | Freq: Two times a day (BID) | ORAL | Status: DC
  Administered 2014-07-05: 100 mg via ORAL
  Filled 2014-07-05: qty 1

## 2014-07-05 MED ORDER — FAMOTIDINE 10 MG PO TABS
10.0000 mg | ORAL_TABLET | Freq: Two times a day (BID) | ORAL | Status: DC
  Administered 2014-07-05: 10 mg via ORAL
  Filled 2014-07-05 (×3): qty 1

## 2014-07-05 MED ORDER — SODIUM CHLORIDE 0.9 % IV SOLN
INTRAVENOUS | Status: DC | PRN
  Administered 2014-07-05: 15:00:00 via INTRAVENOUS

## 2014-07-05 MED ORDER — DIAZEPAM 5 MG/ML IJ SOLN
2.5000 mg | INTRAMUSCULAR | Status: DC
  Administered 2014-07-05 (×3): 2.5 mg via INTRAVENOUS

## 2014-07-05 MED ORDER — HYDROCODONE-ACETAMINOPHEN 5-325 MG PO TABS
1.0000 | ORAL_TABLET | ORAL | Status: DC | PRN
  Administered 2014-07-05: 2 via ORAL
  Filled 2014-07-05: qty 2

## 2014-07-05 MED ORDER — LACTATED RINGERS IV SOLN: INTRAVENOUS | Status: DC

## 2014-07-05 MED ORDER — ROSUVASTATIN CALCIUM 10 MG PO TABS
10.0000 mg | ORAL_TABLET | Freq: Every day | ORAL | Status: DC
  Administered 2014-07-05: 10 mg via ORAL
  Filled 2014-07-05 (×2): qty 1

## 2014-07-05 MED ORDER — PROMETHAZINE HCL 25 MG PO TABS: 25.0000 mg | ORAL_TABLET | Freq: Four times a day (QID) | ORAL | Status: DC | PRN

## 2014-07-05 MED ORDER — ALUM & MAG HYDROXIDE-SIMETH 200-200-20 MG/5ML PO SUSP: 30.0000 mL | Freq: Four times a day (QID) | ORAL | Status: DC | PRN

## 2014-07-05 MED ORDER — ACETAMINOPHEN 10 MG/ML IV SOLN
INTRAVENOUS | Status: DC | PRN
  Administered 2014-07-05: 1000 mg via INTRAVENOUS

## 2014-07-05 MED ORDER — OXYCODONE-ACETAMINOPHEN 10-325 MG PO TABS: 1.0000 | ORAL_TABLET | ORAL | Status: DC | PRN

## 2014-07-05 MED ORDER — HYDROMORPHONE HCL 1 MG/ML IJ SOLN
INTRAMUSCULAR | Status: AC
  Filled 2014-07-05: qty 1

## 2014-07-05 MED ORDER — INSULIN ASPART 100 UNIT/ML ~~LOC~~ SOLN: 0.0000 [IU] | Freq: Every day | SUBCUTANEOUS | Status: DC

## 2014-07-05 MED ORDER — KETOROLAC TROMETHAMINE 30 MG/ML IJ SOLN
INTRAMUSCULAR | Status: AC
  Filled 2014-07-05: qty 1

## 2014-07-05 MED ORDER — AMLODIPINE BESYLATE 10 MG PO TABS
10.0000 mg | ORAL_TABLET | Freq: Every day | ORAL | Status: DC
  Filled 2014-07-05: qty 1

## 2014-07-05 MED ORDER — GLIPIZIDE 10 MG PO TABS
10.0000 mg | ORAL_TABLET | Freq: Every day | ORAL | Status: DC
  Administered 2014-07-06: 10 mg via ORAL
  Filled 2014-07-05 (×2): qty 1

## 2014-07-05 MED ORDER — INSULIN ASPART 100 UNIT/ML ~~LOC~~ SOLN: 0.0000 [IU] | Freq: Three times a day (TID) | SUBCUTANEOUS | Status: DC

## 2014-07-05 MED ORDER — VALSARTAN-HYDROCHLOROTHIAZIDE 320-25 MG PO TABS: 1.0000 | ORAL_TABLET | Freq: Every day | ORAL | Status: DC

## 2014-07-05 MED ORDER — ACETAMINOPHEN 10 MG/ML IV SOLN
INTRAVENOUS | Status: AC
  Filled 2014-07-05: qty 100

## 2014-07-05 MED ORDER — SURGIFOAM 100 EX MISC
CUTANEOUS | Status: DC | PRN
  Administered 2014-07-05: 12:00:00 via TOPICAL

## 2014-07-05 MED ORDER — KETAMINE HCL 100 MG/ML IJ SOLN
INTRAMUSCULAR | Status: DC | PRN
  Administered 2014-07-05: 40 mg via INTRAVENOUS
  Administered 2014-07-05: 20 mg via INTRAVENOUS

## 2014-07-05 MED ORDER — PROPOFOL 10 MG/ML IV BOLUS
INTRAVENOUS | Status: DC | PRN
  Administered 2014-07-05: 200 mg via INTRAVENOUS

## 2014-07-05 MED ORDER — HYDROCHLOROTHIAZIDE 25 MG PO TABS
25.0000 mg | ORAL_TABLET | Freq: Every day | ORAL | Status: DC
  Filled 2014-07-05: qty 1

## 2014-07-05 MED ORDER — MORPHINE SULFATE 2 MG/ML IJ SOLN
1.0000 mg | INTRAMUSCULAR | Status: DC | PRN
  Administered 2014-07-05 – 2014-07-06 (×2): 2 mg via INTRAVENOUS
  Filled 2014-07-05 (×2): qty 1

## 2014-07-05 MED ORDER — LIDOCAINE HCL (CARDIAC) 20 MG/ML IV SOLN
INTRAVENOUS | Status: DC | PRN
  Administered 2014-07-05: 100 mg via INTRAVENOUS

## 2014-07-05 MED ORDER — ONDANSETRON HCL 4 MG/2ML IJ SOLN
INTRAMUSCULAR | Status: DC | PRN
  Administered 2014-07-05: 4 mg via INTRAVENOUS

## 2014-07-05 MED ORDER — ROCURONIUM BROMIDE 100 MG/10ML IV SOLN
INTRAVENOUS | Status: DC | PRN
Start: 2014-07-05 — End: 2014-07-05
  Administered 2014-07-05: 50 mg via INTRAVENOUS
  Administered 2014-07-05: 5 mg via INTRAVENOUS
  Administered 2014-07-05: 10 mg via INTRAVENOUS

## 2014-07-05 MED ORDER — PHENYLEPHRINE HCL 10 MG/ML IJ SOLN
10.0000 mg | INTRAVENOUS | Status: DC | PRN
  Administered 2014-07-05: 5 ug/min via INTRAVENOUS

## 2014-07-05 SURGICAL SUPPLY — 69 items
APL SKNCLS STERI-STRIP NONHPOA (GAUZE/BANDAGES/DRESSINGS) ×1
BAG DECANTER FOR FLEXI CONT (MISCELLANEOUS) ×3 IMPLANT
BENZOIN TINCTURE PRP APPL 2/3 (GAUZE/BANDAGES/DRESSINGS) ×3 IMPLANT
BRUSH SCRUB EZ PLAIN DRY (MISCELLANEOUS) ×3 IMPLANT
BUR MATCHSTICK NEURO 3.0 LAGG (BURR) ×3 IMPLANT
BUR PRECISION FLUTE 6.0 (BURR) ×6 IMPLANT
CANISTER SUCT 3000ML (MISCELLANEOUS) ×3 IMPLANT
CAP LCK SPNE (Orthopedic Implant) ×2 IMPLANT
CAP LOCK SPINE RADIUS (Orthopedic Implant) ×2 IMPLANT
CAP LOCKING (Orthopedic Implant) ×6 IMPLANT
CAP LOCKING 90D (Cap) ×18 IMPLANT
CLOSURE WOUND 1/2 X4 (GAUZE/BANDAGES/DRESSINGS) ×1
CONT SPEC 4OZ CLIKSEAL STRL BL (MISCELLANEOUS) ×3 IMPLANT
COVER BACK TABLE 60X90IN (DRAPES) ×3 IMPLANT
DRAPE C-ARM 42X72 X-RAY (DRAPES) ×6 IMPLANT
DRAPE LAPAROTOMY 100X72X124 (DRAPES) ×3 IMPLANT
DRAPE POUCH INSTRU U-SHP 10X18 (DRAPES) ×3 IMPLANT
DRAPE PROXIMA HALF (DRAPES) ×3 IMPLANT
DRAPE SURG 17X23 STRL (DRAPES) ×12 IMPLANT
ELECT BLADE 4.0 EZ CLEAN MEGAD (MISCELLANEOUS) ×3
ELECT REM PT RETURN 9FT ADLT (ELECTROSURGICAL) ×3
ELECTRODE BLDE 4.0 EZ CLN MEGD (MISCELLANEOUS) ×1 IMPLANT
ELECTRODE REM PT RTRN 9FT ADLT (ELECTROSURGICAL) ×1 IMPLANT
EVACUATOR 1/8 PVC DRAIN (DRAIN) ×3 IMPLANT
GAUZE SPONGE 4X4 12PLY STRL (GAUZE/BANDAGES/DRESSINGS) IMPLANT
GAUZE SPONGE 4X4 16PLY XRAY LF (GAUZE/BANDAGES/DRESSINGS) ×3 IMPLANT
GLOVE BIO SURGEON STRL SZ8.5 (GLOVE) ×6 IMPLANT
GLOVE BIOGEL PI IND STRL 7.0 (GLOVE) ×2 IMPLANT
GLOVE BIOGEL PI INDICATOR 7.0 (GLOVE) ×4
GLOVE SS BIOGEL STRL SZ 8 (GLOVE) ×2 IMPLANT
GLOVE SUPERSENSE BIOGEL SZ 8 (GLOVE) ×4
GLOVE SURG SS PI 7.0 STRL IVOR (GLOVE) ×9 IMPLANT
GLOVE SURG SS PI 7.5 STRL IVOR (GLOVE) ×6 IMPLANT
GOWN STRL REUS W/ TWL LRG LVL3 (GOWN DISPOSABLE) IMPLANT
GOWN STRL REUS W/ TWL XL LVL3 (GOWN DISPOSABLE) ×3 IMPLANT
GOWN STRL REUS W/TWL 2XL LVL3 (GOWN DISPOSABLE) IMPLANT
GOWN STRL REUS W/TWL LRG LVL3 (GOWN DISPOSABLE)
GOWN STRL REUS W/TWL XL LVL3 (GOWN DISPOSABLE) ×9
KIT BASIN OR (CUSTOM PROCEDURE TRAY) ×3 IMPLANT
KIT ROOM TURNOVER OR (KITS) ×3 IMPLANT
NEEDLE HYPO 21X1.5 SAFETY (NEEDLE) IMPLANT
NEEDLE HYPO 22GX1.5 SAFETY (NEEDLE) ×3 IMPLANT
NS IRRIG 1000ML POUR BTL (IV SOLUTION) ×3 IMPLANT
PACK LAMINECTOMY NEURO (CUSTOM PROCEDURE TRAY) ×3 IMPLANT
PAD ARMBOARD 7.5X6 YLW CONV (MISCELLANEOUS) ×9 IMPLANT
PATTIES SURGICAL .5 X1 (DISPOSABLE) IMPLANT
PATTIES SURGICAL 1X1 (DISPOSABLE) ×3 IMPLANT
PEEK PLIF AVS 12X25X8 (Peek) ×6 IMPLANT
PEEK PLIF AVS 9X25X8 (Peek) IMPLANT
ROD 100MM (Rod) ×6 IMPLANT
ROD CROSS LINK SHORT (Rod) ×3 IMPLANT
ROD SPNL 100X5.5XNS TI RDS (Rod) ×2 IMPLANT
SCREW 6.75X55MM (Screw) ×6 IMPLANT
SPONGE GAUZE 4X4 12PLY STER LF (GAUZE/BANDAGES/DRESSINGS) ×3 IMPLANT
SPONGE LAP 4X18 X RAY DECT (DISPOSABLE) IMPLANT
SPONGE NEURO XRAY DETECT 1X3 (DISPOSABLE) IMPLANT
SPONGE SURGIFOAM ABS GEL 100 (HEMOSTASIS) ×3 IMPLANT
STRIP BIOACTIVE 20CC 25X100X8 (Miscellaneous) ×3 IMPLANT
STRIP CLOSURE SKIN 1/2X4 (GAUZE/BANDAGES/DRESSINGS) ×2 IMPLANT
SUT VIC AB 1 CT1 18XBRD ANBCTR (SUTURE) ×2 IMPLANT
SUT VIC AB 1 CT1 8-18 (SUTURE) ×4
SUT VIC AB 2-0 CP2 18 (SUTURE) ×6 IMPLANT
SYR 20CC LL (SYRINGE) IMPLANT
SYR 20ML ECCENTRIC (SYRINGE) ×3 IMPLANT
TAPE CLOTH SURG 4X10 WHT LF (GAUZE/BANDAGES/DRESSINGS) ×3 IMPLANT
TOWEL OR 17X24 6PK STRL BLUE (TOWEL DISPOSABLE) ×3 IMPLANT
TOWEL OR 17X26 10 PK STRL BLUE (TOWEL DISPOSABLE) ×3 IMPLANT
TRAY FOLEY CATH 16FR SILVER (SET/KITS/TRAYS/PACK) ×3 IMPLANT
WATER STERILE IRR 1000ML POUR (IV SOLUTION) ×3 IMPLANT

## 2014-07-05 NOTE — H&P (Signed)
Subjective: The patient is a 64 year old black male on whom I previously performed a L4-5 and L5-S1 instrumentation and fusion. He's had some chronic back pain but it has gotten progressively worse. He was worked up with a lumbar MRI which demonstrated severe stenosis at L3-4. I discussed the various treatment options with the patient and his wife including surgery. He has weighed the risks, benefits, and alternative surgery and decided to proceed with a L3-4 decompression, instrumentation and fusion.  Of note the patient had elevated renal function test. He has seen his medical doctor regarding this and has been cleared for surgery.  Past Medical History  Diagnosis Date  . Hypertension   . Diabetes mellitus   . Anxiety   . Depression   . Arthritis   . Asthma   . Cervical spondylosis without myelopathy   . Spinal stenosis in cervical region   . Lumbar post-laminectomy syndrome   . Lumbosacral neuritis   . Mononeuritis multiplex     Past Surgical History  Procedure Laterality Date  . Lumbar fusion      L 4-5, L5 si    Allergies  Allergen Reactions  . Isosorb Dinitrate-Hydralazine Rash    History  Substance Use Topics  . Smoking status: Never Smoker   . Smokeless tobacco: Never Used  . Alcohol Use: No     Comment: rare    Family History  Problem Relation Age of Onset  . Breast cancer Maternal Aunt   . Prostate cancer Father   . Hypertension Sister    Prior to Admission medications   Medication Sig Start Date End Date Taking? Authorizing Provider  amLODipine (NORVASC) 10 MG tablet Take 10 mg by mouth daily.     Yes Historical Provider, MD  clobetasol cream (TEMOVATE) 4.85 % Apply 1 application topically 2 (two) times daily as needed (rash).    Yes Lucianne Lei, MD  cloNIDine (CATAPRES - DOSED IN MG/24 HR) 0.3 mg/24hr Place 1 patch onto the skin once a week.   Yes Historical Provider, MD  clotrimazole-betamethasone (LOTRISONE) cream Apply 1 application topically 2 (two)  times daily as needed.    Yes Lucianne Lei, MD  diclofenac sodium (VOLTAREN) 1 % GEL Apply 2 g topically 4 (four) times daily. 11/26/13  Yes Danella Sensing, NP  DULoxetine (CYMBALTA) 60 MG capsule TAKE ONE CAPSULE BY MOUTH EVERY DAY 04/15/14  Yes Charlett Blake, MD  famotidine (PEPCID AC) 10 MG chewable tablet Chew 10 mg by mouth daily as needed for heartburn.   Yes Historical Provider, MD  fexofenadine (ALLEGRA) 180 MG tablet Take 180 mg by mouth daily as needed for allergies.    Yes Historical Provider, MD  glipiZIDE (GLUCOTROL) 10 MG tablet Take 10 mg by mouth daily.   Yes Ron Parker, MD  Iron-FA-B Cmp-C-Biot-Probiotic (FUSION PLUS) CAPS Take 1 capsule by mouth daily. 04/03/14  Yes Historical Provider, MD  losartan-hydrochlorothiazide (HYZAAR) 100-12.5 MG per tablet Take 1 tablet by mouth daily.   Yes Historical Provider, MD  meloxicam (MOBIC) 15 MG tablet Take 1 tablet (15 mg total) by mouth daily. 08/03/13  Yes Charlett Blake, MD  metFORMIN (GLUCOPHAGE) 850 MG tablet Take 850 mg by mouth 2 (two) times daily with a meal.   Yes Historical Provider, MD  methocarbamol (ROBAXIN) 500 MG tablet Take 1 tablet (500 mg total) by mouth 3 (three) times daily. 08/03/13  Yes Charlett Blake, MD  methylcellulose packet Take 1 each by mouth at bedtime.   Yes Historical  Provider, MD  oxyCODONE-acetaminophen (PERCOCET) 10-325 MG per tablet Take 1 tablet by mouth every 8 (eight) hours as needed for pain. 06/08/14  Yes Danella Sensing, NP  oxymorphone (OPANA ER) 30 MG 12 hr tablet Take 1 tablet (30 mg total) by mouth every 12 (twelve) hours. 06/08/14  Yes Danella Sensing, NP  polyethylene glycol (MIRALAX / GLYCOLAX) packet Take 17 g by mouth daily as needed.   Yes Historical Provider, MD  pregabalin (LYRICA) 100 MG capsule Take 1 capsule (100 mg total) by mouth 3 (three) times daily. 04/15/14  Yes Charlett Blake, MD  promethazine (PHENERGAN) 25 MG tablet Take 25 mg by mouth every 6 (six) hours as needed.    Yes Ron Parker, MD  rosuvastatin (CRESTOR) 10 MG tablet Take 10 mg by mouth daily.   Yes Historical Provider, MD  Tamsulosin HCl (FLOMAX) 0.4 MG CAPS Take 0.4 mg by mouth at bedtime.   Yes Historical Provider, MD  valsartan-hydrochlorothiazide (DIOVAN-HCT) 320-25 MG per tablet Take 1 tablet by mouth daily.   Yes Historical Provider, MD  OPANA ER, CRUSH RESISTANT, 30 MG T12A Take 1 tablet by mouth every 12 (twelve) hours. 05/10/14   Historical Provider, MD     Review of Systems  Positive ROS: As above  All other systems have been reviewed and were otherwise negative with the exception of those mentioned in the HPI and as above.  Objective: Vital signs in last 24 hours: Temp:  [98.9 F (37.2 C)] 98.9 F (37.2 C) (01/25 0750) Pulse Rate:  [78] 78 (01/25 0750) Resp:  [16] 16 (01/25 0750) BP: (147)/(77) 147/77 mmHg (01/25 0750) SpO2:  [100 %] 100 % (01/25 0750) Weight:  [107.559 kg (237 lb 2 oz)] 107.559 kg (237 lb 2 oz) (01/25 0749)  General Appearance: Alert, cooperative, no distress, Head: Normocephalic, without obvious abnormality, atraumatic Eyes: PERRL, conjunctiva/corneas clear, EOM's intact,    Ears: Normal  Throat: Normal  Neck: Supple, symmetrical, trachea midline, no adenopathy; thyroid: No enlargement/tenderness/nodules; no carotid bruit or JVD Back: Symmetric, no curvature, ROM normal, no CVA tenderness. The patient's lumbar incision is well-healed.  Lungs: Clear to auscultation bilaterally, respirations unlabored Heart: Regular rate and rhythm, no murmur, rub or gallop Abdomen: Soft, non-tender,, no masses, no organomegaly Extremities: Extremities normal, atraumatic, no cyanosis or edema Pulses: 2+ and symmetric all extremities Skin: Skin color, texture, turgor normal, no rashes or lesions  NEUROLOGIC:   Mental status: alert and oriented, no aphasia, good attention span, Fund of knowledge/ memory ok Motor Exam - grossly normal Sensory Exam - grossly  normal Reflexes:  Coordination - grossly normal Gait - grossly normal Balance - grossly normal Cranial Nerves: I: smell Not tested  II: visual acuity  OS: Normal  OD: Normal   II: visual fields Full to confrontation  II: pupils Equal, round, reactive to light  III,VII: ptosis None  III,IV,VI: extraocular muscles  Full ROM  V: mastication Normal  V: facial light touch sensation  Normal  V,VII: corneal reflex  Present  VII: facial muscle function - upper  Normal  VII: facial muscle function - lower Normal  VIII: hearing Not tested  IX: soft palate elevation  Normal  IX,X: gag reflex Present  XI: trapezius strength  5/5  XI: sternocleidomastoid strength 5/5  XI: neck flexion strength  5/5  XII: tongue strength  Normal    Data Review Lab Results  Component Value Date   WBC 5.2 06/28/2014   HGB 10.3* 06/28/2014   HCT 32.4* 06/28/2014  MCV 79.4 06/28/2014   PLT 167 06/28/2014   Lab Results  Component Value Date   NA 138 06/28/2014   K 4.1 06/28/2014   CL 102 06/28/2014   CO2 27 06/28/2014   BUN 22 06/28/2014   CREATININE 1.89* 06/28/2014   GLUCOSE 120* 06/28/2014   No results found for: INR, PROTIME  Assessment/Plan: L3-4 spinal stenosis, lumbago, lumbar radiculopathy, neurogenic claudication: I have discussed the situation with the patient. I have reviewed his imaging studies with him and pointed out the abnormality's. We have discussed the various treatment options including surgery. I described the surgical treatment option of an exploration of his prior lumbar fusion with a L3-4 decompression, instrumentation, and fusion. I have shown him surgical models. We have discussed the risks, benefits, alternatives, and likelihood of achieving our goals with surgery. I have answered all his questions. He has decided to proceed with surgery.  Garcia Dalzell D 07/05/2014 10:57 AM

## 2014-07-05 NOTE — Anesthesia Procedure Notes (Signed)
Procedure Name: Intubation Date/Time: 07/05/2014 12:18 PM Performed by: Garner Nash Pre-anesthesia Checklist: Patient identified, Emergency Drugs available, Suction available, Patient being monitored and Timeout performed Patient Re-evaluated:Patient Re-evaluated prior to inductionOxygen Delivery Method: Circle system utilized Preoxygenation: Pre-oxygenation with 100% oxygen Intubation Type: IV induction Ventilation: Mask ventilation without difficulty Laryngoscope Size: Mac and 4 Grade View: Grade III Tube type: Oral Tube size: 7.5 mm Number of attempts: 1 Airway Equipment and Method: Stylet Placement Confirmation: ETT inserted through vocal cords under direct vision,  breath sounds checked- equal and bilateral,  positive ETCO2 and CO2 detector Secured at: 23 cm Tube secured with: Tape Dental Injury: Teeth and Oropharynx as per pre-operative assessment

## 2014-07-05 NOTE — Transfer of Care (Signed)
Immediate Anesthesia Transfer of Care Note  Patient: Anthony Moran  Procedure(s) Performed: Procedure(s) with comments: Lumbar three-four laminectomy with posterior lumbar interbody fusion with interbody prosthesis posterior lateral arthrodesis and posterior segmental instrumentation and exploration of previous fusion (N/A) - L34 laminectomy with posterior lumbar interbody fusion with interbody prosthesis posterior lateral arthrodesis and posterior segmental instrumentation and exploration of previous fusion  Patient Location: PACU  Anesthesia Type:General  Level of Consciousness: awake, alert  and oriented  Airway & Oxygen Therapy: Patient Spontanous Breathing and Patient connected to nasal cannula oxygen  Post-op Assessment: Report given to PACU RN, Post -op Vital signs reviewed and stable and Patient moving all extremities X 4  Post vital signs: Reviewed and stable  Complications: No apparent anesthesia complications

## 2014-07-05 NOTE — Anesthesia Postprocedure Evaluation (Signed)
  Anesthesia Post-op Note  Patient: Anthony Moran  Procedure(s) Performed: Procedure(s) (LRB): Lumbar three-four laminectomy with posterior lumbar interbody fusion with interbody prosthesis posterior lateral arthrodesis and posterior segmental instrumentation and exploration of previous fusion (N/A)  Patient Location: PACU  Anesthesia Type: General  Level of Consciousness: awake and alert   Airway and Oxygen Therapy: Patient Spontanous Breathing  Post-op Pain: mild  Post-op Assessment: Post-op Vital signs reviewed, Patient's Cardiovascular Status Stable, Respiratory Function Stable, Patent Airway and No signs of Nausea or vomiting  Last Vitals:  Filed Vitals:   07/05/14 1605  BP: 139/99  Pulse: 76  Temp: 36.4 C  Resp: 19    Post-op Vital Signs: stable   Complications: No apparent anesthesia complications

## 2014-07-05 NOTE — Progress Notes (Signed)
Patient ID: Anthony Moran, male   DOB: 1951-05-08, 64 y.o.   MRN: [id number] Subjective:  The patient is a bit agitated and somnolent. He is arousable. I spoke with his wife.  Objective: Vital signs in last 24 hours: Temp:  [97.6 F (36.4 C)-98.9 F (37.2 C)] 97.6 F (36.4 C) (01/25 1605) Pulse Rate:  [76-78] 76 (01/25 1605) Resp:  [16-19] 19 (01/25 1605) BP: (139-147)/(77-99) 139/99 mmHg (01/25 1605) SpO2:  [100 %] 100 % (01/25 1605) Weight:  [107.559 kg (237 lb 2 oz)] 107.559 kg (237 lb 2 oz) (01/25 0749)  Intake/Output from previous day:   Intake/Output this shift: Total I/O In: 3315 [I.V.:3200; Blood:115] Out: 825 [Urine:425; Blood:400]  Physical exam the patient is moving all 4 extremities well.  Lab Results: No results for input(s): WBC, HGB, HCT, PLT in the last 72 hours. BMET No results for input(s): NA, K, CL, CO2, GLUCOSE, BUN, CREATININE, CALCIUM in the last 72 hours.  Studies/Results: Dg Lumbar Spine 2-3 Views  07/05/2014   CLINICAL DATA:  Lumbar spondylolisthesis.  EXAM: LUMBAR SPINE - 2-3 VIEW; DG C-ARM 61-120 MIN  COMPARISON:  MRI 03/16/2014.  Radiographs 02/12/2014.  FINDINGS: AP and lateral fluoroscopic spot views demonstrate extension of posterior lumbar interbody fusion hardware to include the L3-L4 level. Numbering used on prior exams is preserved and lumbosacral transitional anatomy is present. Discectomy with interbody bone graft at L3-L4.  IMPRESSION: Extension of posterior lumbar interbody fusion to the L3-L4 level.   Electronically Signed   By: Dereck Ligas M.D.   On: 07/05/2014 16:25   Dg C-arm 1-60 Min  07/05/2014   CLINICAL DATA:  Lumbar spondylolisthesis.  EXAM: LUMBAR SPINE - 2-3 VIEW; DG C-ARM 61-120 MIN  COMPARISON:  MRI 03/16/2014.  Radiographs 02/12/2014.  FINDINGS: AP and lateral fluoroscopic spot views demonstrate extension of posterior lumbar interbody fusion hardware to include the L3-L4 level. Numbering used on prior exams is preserved and  lumbosacral transitional anatomy is present. Discectomy with interbody bone graft at L3-L4.  IMPRESSION: Extension of posterior lumbar interbody fusion to the L3-L4 level.   Electronically Signed   By: Dereck Ligas M.D.   On: 07/05/2014 16:25    Assessment/Plan: The patient is doing well.  LOS: 0 days     Marguarite Markov D 07/05/2014, 4:35 PM

## 2014-07-05 NOTE — Progress Notes (Addendum)
Spoke with Dr. Rodell Perna concerning the choice of LR or NS for IVF since creat is 1.89.  Dr. Kalman Shan said LR is fine.  DA

## 2014-07-05 NOTE — Op Note (Signed)
Brief history: The patient is a 64 year old black male on whom I previously performed an L4-5 and L5-S1 decompression and fusion years ago. The patient has had chronic back pain but recently has worsened. He has failed medical management and was worked up with a lumbar MRI and lumbar x-rays. This demonstrated the patient had an L3-4 spondylolisthesis, facet arthropathy, spinal stenosis, etc. I discussed the various treatment options with the patient including surgery. He has weighed the risk, benefits, and alternative surgery and decided to proceed with an exploration of lumbar fusion with an L3-4 decompression, instrumentation, and fusion.  Preoperative diagnosis: L3-4, spondylolisthesis, Degenerative disc disease, spinal stenosis compressing both the L3 and the L4 nerve roots; lumbago; lumbar radiculopathy  Postoperative diagnosis: The same  Procedure: Bilateral L3 Laminotomy/foraminotomies to decompress the bilateral L3 and L4 nerve roots(the work required to do this was in addition to the work required to do the posterior lumbar interbody fusion because of the patient's spinal stenosis, facet arthropathy. Etc. requiring a wide decompression of the nerve roots.); L3-4 posterior lumbar interbody fusion with local morselized autograft bone and Kinnex graft extender; insertion of interbody prosthesis at L3-4 (Stryker peek interbody prosthesis); posterior segmental instrumentation from L3 to S1 with Stryker titanium pedicle screws and rods; posterior lateral arthrodesis at L3-4 with local morselized autograft bone and Kinnex bone graft extender; exploration of lumbar fusion.  Surgeon: Dr. Earle Gell  Asst.: Dr. Dayton Bailiff  Anesthesia: Gen. endotracheal  Estimated blood loss: 250 mL  Drains: One medium Hemovac  Complications: None  Description of procedure: The patient was brought to the operating room by the anesthesia team. General endotracheal anesthesia was induced. The patient was turned to  the prone position on the Wilson frame. The patient's lumbosacral region was then prepared with Betadine scrub and Betadine solution. Sterile drapes were applied.  I then injected the area to be incised with Marcaine with epinephrine solution. I then used the scalpel to make a linear midline incision over the L3-4, L4-5 and L5-S1 interspace, incising through the old surgical scar. I then used electrocautery to perform a bilateral subperiosteal dissection exposing the spinous process and lamina of L3, L4, L5 and the upper sacrum.  We then inserted the Verstrac retractor to provide exposure. I began the exploration of the lumbar fusion by removing the caps from the old pedicle screws. I then removed the cross connectors. Then removed the rod. I inspected the arthrodesis. It appeared solid and the screws were not loose.  I began the decompression by using the high speed drill to perform laminotomies at L3 bilaterally. We then used the Kerrison punches to widen the laminotomy and removed the ligamentum flavum at L3-4. We used the Kerrison punches to remove the medial facets at L3-4. We performed wide foraminotomies about the bilateral L3 and L4 nerve roots completing the decompression.  We now turned our attention to the posterior lumbar interbody fusion. I used a scalpel to incise the intervertebral disc at L3-4 bilaterally. I then performed a partial intervertebral discectomy at L3-4 bilaterally using the pituitary forceps. We prepared the vertebral endplates at S0-1 bilaterally for the fusion by removing the soft tissues with the curettes. We then used the trial spacers to pick the appropriate sized interbody prosthesis. We prefilled his prosthesis with a combination of local morselized autograft bone that we obtained during the decompression as well as Kinnex bone graft extender. We inserted the prefilled prosthesis into the interspace at L3-4 bilaterally. There was a good snug fit of the  prosthesis in the  interspace. We then filled and the remainder of the intervertebral disc space with local morselized autograft bone and Kinnex. This completed the posterior lumbar interbody arthrodesis.  We now turned attention to the instrumentation. Under fluoroscopic guidance we cannulated the bilateral L3 pedicles with the bone probe. We then removed the bone probe. We then tapped the pedicle with a 6.25 millimeter tap. We then removed the tap. We probed inside the tapped pedicle with a ball probe to rule out cortical breaches. We then inserted a 6.75 x 55 millimeter pedicle screw into the L3 pedicles bilaterally under fluoroscopic guidance. We then palpated along the medial aspect of the pedicles to rule out cortical breaches. There were none. The nerve roots were not injured. We then connected the unilateral pedicle screws from L3-S1 with a lordotic rod. We compressed the construct and secured the rod in place with the caps. We then tightened the caps appropriately. We placed a cross connector between the rods and tightened it appropriately. This completed the instrumentation from L3-S1.  We now turned our attention to the posterior lateral arthrodesis at L3-4. We used the high-speed drill to decorticate the remainder of the facets, pars, transverse process at L3-4. We then applied a combination of local morselized autograft bone and Kinnex bone graft extender over these decorticated posterior lateral structures. This completed the posterior lateral arthrodesis.  We then obtained hemostasis using bipolar electrocautery. We irrigated the wound out with bacitracin solution. We inspected the thecal sac and nerve roots and noted they were well decompressed. We then removed the retractor. We placed a medium Hemovac drain in the epidural space and tunneled out through separate stab wound. We reapproximated patient's thoracolumbar fascia with interrupted #1 Vicryl suture. We reapproximated patient's subcutaneous tissue with  interrupted 2-0 Vicryl suture. The reapproximated patient's skin with Steri-Strips and benzoin. The wound was then coated with bacitracin ointment. A sterile dressing was applied. The drapes were removed. The patient was subsequently returned to the supine position where they were extubated by the anesthesia team. He was then transported to the post anesthesia care unit in stable condition. All sponge instrument and needle counts were reportedly correct at the end of this case.

## 2014-07-05 NOTE — Anesthesia Preprocedure Evaluation (Addendum)
Anesthesia Evaluation  Patient identified by MRN, date of birth, ID band Patient awake    Reviewed: Allergy & Precautions, NPO status , Patient's Chart, lab work & pertinent test results  Airway Mallampati: II  TM Distance: <3 FB Neck ROM: Limited    Dental no notable dental hx. (+) Teeth Intact, Dental Advisory Given,    Pulmonary neg pulmonary ROS,  breath sounds clear to auscultation  Pulmonary exam normal       Cardiovascular hypertension, Pt. on medications Rhythm:Regular Rate:Normal     Neuro/Psych Anxiety negative neurological ROS     GI/Hepatic negative GI ROS, Neg liver ROS,   Endo/Other  diabetesobesity  Renal/GU negative Renal ROS  negative genitourinary   Musculoskeletal negative musculoskeletal ROS (+)   Abdominal   Peds negative pediatric ROS (+)  Hematology negative hematology ROS (+)   Anesthesia Other Findings   Reproductive/Obstetrics negative OB ROS                            Anesthesia Physical Anesthesia Plan  ASA: II  Anesthesia Plan: General   Post-op Pain Management:    Induction: Intravenous  Airway Management Planned: Oral ETT  Additional Equipment:   Intra-op Plan:   Post-operative Plan: Extubation in OR  Informed Consent: I have reviewed the patients History and Physical, chart, labs and discussed the procedure including the risks, benefits and alternatives for the proposed anesthesia with the patient or authorized representative who has indicated his/her understanding and acceptance.   Dental advisory given  Plan Discussed with: CRNA and Surgeon  Anesthesia Plan Comments:         Anesthesia Quick Evaluation

## 2014-07-06 LAB — TYPE AND SCREEN
ABO/RH(D): B POS
Antibody Screen: NEGATIVE

## 2014-07-06 LAB — CBC
HEMATOCRIT: 30.9 % — AB (ref 39.0–52.0)
Hemoglobin: 10.2 g/dL — ABNORMAL LOW (ref 13.0–17.0)
MCH: 26.3 pg (ref 26.0–34.0)
MCHC: 33 g/dL (ref 30.0–36.0)
MCV: 79.6 fL (ref 78.0–100.0)
Platelets: 169 10*3/uL (ref 150–400)
RBC: 3.88 MIL/uL — ABNORMAL LOW (ref 4.22–5.81)
RDW: 13.6 % (ref 11.5–15.5)
WBC: 8 10*3/uL (ref 4.0–10.5)

## 2014-07-06 LAB — BASIC METABOLIC PANEL
Anion gap: 9 (ref 5–15)
BUN: 18 mg/dL (ref 6–23)
CHLORIDE: 101 mmol/L (ref 96–112)
CO2: 28 mmol/L (ref 19–32)
CREATININE: 1.7 mg/dL — AB (ref 0.50–1.35)
Calcium: 8.9 mg/dL (ref 8.4–10.5)
GFR calc Af Amer: 48 mL/min — ABNORMAL LOW (ref >90)
GFR, EST NON AFRICAN AMERICAN: 41 mL/min — AB (ref >90)
Glucose, Bld: 181 mg/dL — ABNORMAL HIGH (ref 70–99)
Potassium: 3.7 mmol/L (ref 3.5–5.1)
Sodium: 138 mmol/L (ref 135–145)

## 2014-07-06 LAB — GLUCOSE, CAPILLARY: GLUCOSE-CAPILLARY: 129 mg/dL — AB (ref 70–99)

## 2014-07-06 MED ORDER — DOCUSATE SODIUM 100 MG PO CAPS: 100.0000 mg | ORAL_CAPSULE | Freq: Two times a day (BID) | ORAL | Status: DC

## 2014-07-06 MED ORDER — OXYCODONE-ACETAMINOPHEN 10-325 MG PO TABS: 1.0000 | ORAL_TABLET | ORAL | Status: DC | PRN

## 2014-07-06 MED ORDER — DIAZEPAM 5 MG PO TABS: 5.0000 mg | ORAL_TABLET | Freq: Four times a day (QID) | ORAL | Status: DC | PRN

## 2014-07-06 MED FILL — Sodium Chloride IV Soln 0.9%: INTRAVENOUS | Qty: 2000 | Status: AC

## 2014-07-06 MED FILL — Heparin Sodium (Porcine) Inj 1000 Unit/ML: INTRAMUSCULAR | Qty: 30 | Status: AC

## 2014-07-06 NOTE — Progress Notes (Signed)
Pt and wife given D/C instructions with Rx's, verbal understanding was provided. Pt's incision is clean and dry with no sign of infection. Pt's IV was removed prior to D/C. Pt D/C'd home via wheelchair @ 1225 per MD order. Pt is stable @ D/C and has no other needs at this time. Arnez Stoneking, RN  

## 2014-07-06 NOTE — Evaluation (Signed)
Physical Therapy Evaluation Patient Details Name: Anthony Moran MRN: [id number] DOB: [birth year] Today's Date: 07/06/2014   History of Present Illness  64 yo male with L3-4 spondylolisthesis, lumbar stenosis and radiculopathy received an L3-4 decompression with fusion L4-5 and L5-S1 with allograft and autograft bone, screws and rods, Peek interbody prosthesis.  Clinical Impression  Pt was seen for evaluation for back surgery and to make equipment recommendations.  Pt was undecided about some equipment needs and will discuss further with his wife.  Current need is for toilet riser and outpatient therapy later.  No AD will be needed based on PT eval but can certainly get [facility] if needed later.    Follow Up Recommendations Outpatient PT;Supervision - Intermittent    Equipment Recommendations  Other (comment) (toilet riser)    Recommendations for Other Services       Precautions / Restrictions Precautions Precautions: Back Precaution Booklet Issued: Yes (comment) Precaution Comments: reviewed extensively as pt has 14 stairs in house and 2 outdoors with one and no rails respectively Required Braces or Orthoses: Spinal Brace Spinal Brace: Lumbar corset;Applied in standing position (Pt has a high bed and cannot sit without feet dangling) Restrictions Weight Bearing Restrictions: No      Mobility  Bed Mobility Overal bed mobility: Needs Assistance Bed Mobility: Rolling;Sidelying to Sit Rolling: Supervision Sidelying to sit: Supervision       General bed mobility comments: up without bedrail and bed level  Transfers Overall transfer level: Modified independent Equipment used: None             General transfer comment: hand placement on bed and avoiding twisting  Ambulation/Gait Ambulation/Gait assistance: Supervision;Min guard Ambulation Distance (Feet): 250 Feet Assistive device: None Gait Pattern/deviations: Step-through pattern;Decreased stride length;Wide base of  support Gait velocity: slow Gait velocity interpretation: Below normal speed for age/gender    Stairs            Wheelchair Mobility    Modified Rankin (Stroke Patients Only)       Balance Overall balance assessment: Modified Independent                                           Pertinent Vitals/Pain Pain Assessment: Faces Faces Pain Scale: Hurts little more Pain Location: lumbar spine Pain Intervention(s): Limited activity within patient's tolerance;Monitored during session;Premedicated before session;Repositioned    Home Living Family/patient expects to be discharged to:: Private residence Living Arrangements: Spouse/significant other Available Help at Discharge: Family Type of Home: House Home Access: Stairs to enter Entrance Stairs-Rails: None Technical brewer of Steps: 2 Home Layout: Two level Home Equipment: None      Prior Function Level of Independence: Independent               Hand Dominance        Extremity/Trunk Assessment   Upper Extremity Assessment: Overall WFL for tasks assessed           Lower Extremity Assessment: Overall WFL for tasks assessed      Cervical / Trunk Assessment: Other exceptions (new lumbar spine surgery)  Communication   Communication: No difficulties  Cognition Arousal/Alertness: Awake/alert Behavior During Therapy: WFL for tasks assessed/performed Overall Cognitive Status: Within Functional Limits for tasks assessed                      General Comments General comments (skin integrity, edema, etc.):  pt is standing on floor next to bed, using back brace with reminders and able to maintain balance    Exercises        Assessment/Plan    PT Assessment All further PT needs can be met in the next venue of care  PT Diagnosis Acute pain   PT Problem List Decreased activity tolerance;Decreased mobility;Decreased knowledge of precautions;Decreased skin integrity;Pain   PT Treatment Interventions     PT Goals (Current goals can be found in the Care Plan section) Acute Rehab PT Goals Patient Stated Goal: to go home PT Goal Formulation: With patient/family Time For Goal Achievement: 07/07/14 Potential to Achieve Goals: Good    Frequency     Barriers to discharge        Co-evaluation               End of Session Equipment Utilized During Treatment: Back brace Activity Tolerance: Patient tolerated treatment well Patient left: in bed;Other (comment);with call bell/phone within reach;with family/visitor present Nurse Communication: Mobility status         Time: 6004-5997 PT Time Calculation (min) (ACUTE ONLY): 38 min   Charges:   PT Evaluation $Initial PT Evaluation Tier I: 1 Procedure PT Treatments $Gait Training: 8-22 mins $Therapeutic Activity: 8-22 mins   PT G Codes:        Ramond Dial 2014-07-07, 10:39 AM   Mee Hives, PT MS Acute Rehab Dept. Number: [phone]

## 2014-07-06 NOTE — Discharge Instructions (Signed)

## 2014-07-06 NOTE — Discharge Summary (Signed)
Physician Discharge Summary  Patient ID: Anthony Moran MRN: [id number] DOB/AGE: [birth year] 63 y.o.  Admit date: 07/05/2014 Discharge date: 07/06/2014  Admission Diagnoses: L3-4 spondylolisthesis, spinal stenosis, lumbago, lumbar radiculopathy  Discharge Diagnoses: The same Active Problems:   Spondylolisthesis of lumbar region   Discharged Condition: good  Hospital Course: I performed an exploration of the patient's lumbar fusion with an L3-4 decompression, instrumentation, and fusion on 07/05/2014. The surgery went well.  The patient's postoperative course was unremarkable. On postoperative day #1 he, and his wife were given discharge instructions. All her questions were answered.  Consults: PT Significant Diagnostic Studies: None Treatments: Exploration of lumbar fusion, L3-4 decompression, instrumentation, and fusion. Discharge Exam: Blood pressure 146/63, pulse 85, temperature 99.4 F (37.4 C), temperature source Oral, resp. rate 17, height 5\' 10"  (1.778 m), weight 107.559 kg (237 lb 2 oz), SpO2 99 %. The patient is alert and pleasant. He looks well. His strength is normal in his lower extremities. His dressing is clean and dry.  Disposition: Home     Medication List    STOP taking these medications        diclofenac sodium 1 % Gel  Commonly known as:  VOLTAREN     meloxicam 15 MG tablet  Commonly known as:  MOBIC     methocarbamol 500 MG tablet  Commonly known as:  ROBAXIN      TAKE these medications        amLODipine 10 MG tablet  Commonly known as:  NORVASC  Take 10 mg by mouth daily.     clobetasol cream 0.05 %  Commonly known as:  TEMOVATE  Apply 1 application topically 2 (two) times daily as needed (rash).     cloNIDine 0.3 mg/24hr patch  Commonly known as:  CATAPRES - Dosed in mg/24 hr  Place 1 patch onto the skin once a week.     clotrimazole-betamethasone cream  Commonly known as:  LOTRISONE  Apply 1 application topically 2 (two) times daily  as needed.     diazepam 5 MG tablet  Commonly known as:  VALIUM  Take 1 tablet (5 mg total) by mouth every 6 (six) hours as needed for muscle spasms.     docusate sodium 100 MG capsule  Commonly known as:  COLACE  Take 1 capsule (100 mg total) by mouth 2 (two) times daily.     DULoxetine 60 MG capsule  Commonly known as:  CYMBALTA  TAKE ONE CAPSULE BY MOUTH EVERY DAY     famotidine 10 MG chewable tablet  Commonly known as:  PEPCID AC  Chew 10 mg by mouth daily as needed for heartburn.     fexofenadine 180 MG tablet  Commonly known as:  ALLEGRA  Take 180 mg by mouth daily as needed for allergies.     FUSION PLUS Caps  Take 1 capsule by mouth daily.     glipiZIDE 10 MG tablet  Commonly known as:  GLUCOTROL  Take 10 mg by mouth daily.     losartan-hydrochlorothiazide 100-12.5 MG per tablet  Commonly known as:  HYZAAR  Take 1 tablet by mouth daily.     metFORMIN 850 MG tablet  Commonly known as:  GLUCOPHAGE  Take 850 mg by mouth 2 (two) times daily with a meal.     methylcellulose packet  Take 1 each by mouth at bedtime.     OPANA ER (CRUSH RESISTANT) 30 MG T12a  Generic drug:  Oxymorphone HCl (Crush Resist)  Take 1 tablet by  mouth every 12 (twelve) hours.     oxyCODONE-acetaminophen 10-325 MG per tablet  Commonly known as:  PERCOCET  Take 1 tablet by mouth every 8 (eight) hours as needed for pain.     oxyCODONE-acetaminophen 10-325 MG per tablet  Commonly known as:  PERCOCET  Take 1 tablet by mouth every 4 (four) hours as needed for pain.     polyethylene glycol packet  Commonly known as:  MIRALAX / GLYCOLAX  Take 17 g by mouth daily as needed.     pregabalin 100 MG capsule  Commonly known as:  LYRICA  Take 1 capsule (100 mg total) by mouth 3 (three) times daily.     promethazine 25 MG tablet  Commonly known as:  PHENERGAN  Take 25 mg by mouth every 6 (six) hours as needed.     rosuvastatin 10 MG tablet  Commonly known as:  CRESTOR  Take 10 mg by mouth  daily.     tamsulosin 0.4 MG Caps capsule  Commonly known as:  FLOMAX  Take 0.4 mg by mouth at bedtime.     valsartan-hydrochlorothiazide 320-25 MG per tablet  Commonly known as:  DIOVAN-HCT  Take 1 tablet by mouth daily.         SignedOphelia Charter 07/06/2014, 7:39 AM

## 2014-07-09 ENCOUNTER — Telehealth: Payer: Self-pay | Admitting: *Deleted

## 2014-07-09 NOTE — Telephone Encounter (Signed)
Wife calling on pt's behalf, pt had surgery Monday the 25th with Dr. Arnoldo Morale, patient needs his opana script refilled, wife is wanting to come by and pick up the script...hoping for an answer soon

## 2014-07-09 NOTE — Telephone Encounter (Signed)
Dr. Arnoldo Morale will need to do all pain management until he is released from his care. This is always how it is done

## 2014-07-12 NOTE — Telephone Encounter (Signed)
I left a voicemail on Anthony Moran's mobile phone with his voice on it informing him that Anthony Moran is to prescribed his meds until released from his service. I left generic message on home phone with no identifier that I left message on Anthony Moran's mobile phone.

## 2014-07-21 ENCOUNTER — Telehealth: Payer: Self-pay | Admitting: *Deleted

## 2014-07-21 NOTE — Telephone Encounter (Signed)
Pt calling for a refill on his opana

## 2014-07-22 ENCOUNTER — Telehealth: Payer: Self-pay | Admitting: *Deleted

## 2014-07-22 NOTE — Telephone Encounter (Signed)
I spoke with Anthony Moran.  I asked if he was released from Dr Arnoldo Morale service.  He says that he is only seeing Dr Arnoldo Morale for x-rays and Dr Arnoldo Morale told Anthony Hoe he was not going to write for any more narcotics.  I told him that he would need to be seen for his Rx and that Dr Letta Pate has appts available tomorrow.  He says that he cannot come in because he had surgery.  I told him he cannot get an Rx without an appointment.  He tells me that he is going to try and find someone else to prescribe his medications because of this and he will let us know.  I told him he must be seen to get narcotics and if he can find a prescriber who will do this for his without being seen, that is his perogative,

## 2014-07-22 NOTE — Telephone Encounter (Signed)
Left message for Anthony Moran to call us so that we can schedule him to be seen if he is needing pain med.  He was to be getting pain med through surgeon until released but is still calling for pain med from here.

## 2014-07-22 NOTE — Telephone Encounter (Signed)
erroneous

## 2014-08-12 ENCOUNTER — Other Ambulatory Visit: Payer: Self-pay | Admitting: Physical Medicine & Rehabilitation

## 2014-08-25 ENCOUNTER — Other Ambulatory Visit: Payer: Self-pay | Admitting: Physical Medicine & Rehabilitation

## 2014-08-26 ENCOUNTER — Encounter: Payer: Self-pay | Admitting: Physical Medicine & Rehabilitation

## 2014-08-26 ENCOUNTER — Encounter: Payer: Medicaid Other | Attending: Physical Medicine & Rehabilitation

## 2014-08-26 ENCOUNTER — Ambulatory Visit (HOSPITAL_BASED_OUTPATIENT_CLINIC_OR_DEPARTMENT_OTHER): Payer: Medicaid Other | Admitting: Physical Medicine & Rehabilitation

## 2014-08-26 VITALS — BP 147/61 | HR 90 | Resp 14

## 2014-08-26 DIAGNOSIS — Z79899 Other long term (current) drug therapy: Secondary | ICD-10-CM | POA: Diagnosis present

## 2014-08-26 DIAGNOSIS — M545 Low back pain: Secondary | ICD-10-CM | POA: Insufficient documentation

## 2014-08-26 DIAGNOSIS — M4316 Spondylolisthesis, lumbar region: Secondary | ICD-10-CM

## 2014-08-26 DIAGNOSIS — M961 Postlaminectomy syndrome, not elsewhere classified: Secondary | ICD-10-CM

## 2014-08-26 DIAGNOSIS — Z5181 Encounter for therapeutic drug level monitoring: Secondary | ICD-10-CM | POA: Insufficient documentation

## 2014-08-26 MED ORDER — DULOXETINE HCL 60 MG PO CPEP: 60.0000 mg | ORAL_CAPSULE | Freq: Every day | ORAL | Status: DC

## 2014-08-26 MED ORDER — PREGABALIN 100 MG PO CAPS: 100.0000 mg | ORAL_CAPSULE | Freq: Three times a day (TID) | ORAL | Status: DC

## 2014-08-26 MED ORDER — OXYCODONE-ACETAMINOPHEN 10-325 MG PO TABS: 1.0000 | ORAL_TABLET | ORAL | Status: DC | PRN

## 2014-08-26 MED ORDER — OPANA ER 30 MG PO T12A: 1.0000 | EXTENDED_RELEASE_TABLET | Freq: Two times a day (BID) | ORAL | Status: DC

## 2014-08-26 NOTE — Progress Notes (Signed)
Subjective:    Patient ID: Anthony Moran, male    DOB: 02/26/1951, 64 y.o.   MRN: [id number] lumbar fusion with an L3-4 decompression, instrumentation, and fusion on 07/05/2014 HPI Saw Dr Arnoldo Morale 1 month ago, Next appt in 3 months Doing stairs at home, Independent with self-care and mobility although balance is not back to baseline Stumbled at home and aggravated back and leg pain, Patient states that he did not fall . Has been maintained on his chronic pain medications by neurosurgery during the perioperative time. Now will not be seeing neurosurgery for several months.  Pain Inventory Average Pain 3 Pain Right Now 5 My pain is dull, tingling and aching  In the last 24 hours, has pain interfered with the following? General activity 5 Relation with others 10 Enjoyment of life 7 What TIME of day is your pain at its worst? morning Sleep (in general) Poor  Pain is worse with: bending, sitting, standing and some activites Pain improves with: rest, heat/ice and medication Relief from Meds: 5  Mobility walk without assistance ability to climb steps?  no do you drive?  no Do you have any goals in this area?  yes  Function disabled: date disabled . I need assistance with the following:  dressing, meal prep, household duties and shopping Do you have any goals in this area?  yes  Neuro/Psych weakness numbness tingling spasms dizziness confusion depression anxiety loss of taste or smell  Prior Studies Any changes since last visit?  yes f/u back surgery  Physicians involved in your care Any changes since last visit?  yes Primary care Dr. Criss Rosales Neurosurgeon Dr. Newman Pies   Family History  Problem Relation Age of Onset  . Breast cancer Maternal Aunt   . Prostate cancer Father   . Hypertension Sister    History   Social History  . Marital Status: Married    Spouse Name: N/A  . Number of Children: 3  . Years of Education: N/A   Occupational History    .     Social History Main Topics  . Smoking status: Never Smoker   . Smokeless tobacco: Never Used  . Alcohol Use: No     Comment: rare  . Drug Use: No  . Sexual Activity: Not on file   Other Topics Concern  . Not on file   Social History Narrative   Past Surgical History  Procedure Laterality Date  . Lumbar fusion      L 4-5, L5 si   Past Medical History  Diagnosis Date  . Hypertension   . Diabetes mellitus   . Anxiety   . Depression   . Arthritis   . Asthma   . Cervical spondylosis without myelopathy   . Spinal stenosis in cervical region   . Lumbar post-laminectomy syndrome   . Lumbosacral neuritis   . Mononeuritis multiplex    There were no vitals taken for this visit.  Opioid Risk Score:   Fall Risk Score:    Review of Systems  Constitutional:       Night sweats Weight loss Poor appetite Loss of smell   Respiratory: Positive for wheezing.   Gastrointestinal: Positive for nausea and constipation.  Endocrine:       High blood sugar  Neurological: Positive for dizziness, weakness and numbness.       Tingling Spasms   Psychiatric/Behavioral: Positive for confusion and dysphoric mood. The patient is nervous/anxious.   All other systems reviewed and are negative.  Objective:   Physical Exam  Constitutional: He is oriented to person, place, and time. He appears well-developed and well-nourished.  HENT:  Head: Normocephalic and atraumatic.  Neurological: He is alert and oriented to person, place, and time. He displays no atrophy. No sensory deficit. He exhibits normal muscle tone. Gait abnormal.  Reflex Scores:      Patellar reflexes are 2+ on the right side and 2+ on the left side.      Achilles reflexes are 2+ on the right side and 2+ on the left side. Psychiatric: He has a normal mood and affect.  Nursing note and vitals reviewed.         Assessment & Plan:  1. Lumbar postlaminectomy syndrome he had previous L4-5 L5-S1 fusion but most  recently underwent L3-4 fusion for adjacent level degeneration. He is approximate 6 weeks postoperative. He will see neurosurgery again in about 3 months. His wound is healing well, his radicular symptoms have improved. We'll continue current pain medications Lyrica 100 mg 3 times a day, may be able to wean this given that his radicular pain has improved Oxymorphone extended release 30 g twice a day Oxycodone 10 mg 3-4 tablets per day #100  Return to clinic one month Discussed gradually increasing walking, flat surfaces, do not increase distance more than 10% per week Discussed straight leg raises as well as knee extension exercises to increase quad strength

## 2014-08-26 NOTE — Patient Instructions (Signed)
Start walking program

## 2014-09-08 ENCOUNTER — Other Ambulatory Visit: Payer: Self-pay | Admitting: Physical Medicine & Rehabilitation

## 2014-09-23 ENCOUNTER — Encounter: Payer: Medicaid Other | Attending: Physical Medicine & Rehabilitation

## 2014-09-23 ENCOUNTER — Encounter: Payer: Self-pay | Admitting: Physical Medicine & Rehabilitation

## 2014-09-23 ENCOUNTER — Other Ambulatory Visit: Payer: Self-pay | Admitting: Physical Medicine & Rehabilitation

## 2014-09-23 ENCOUNTER — Ambulatory Visit (HOSPITAL_BASED_OUTPATIENT_CLINIC_OR_DEPARTMENT_OTHER): Payer: Medicaid Other | Admitting: Physical Medicine & Rehabilitation

## 2014-09-23 VITALS — BP 149/81 | HR 91 | Resp 14

## 2014-09-23 DIAGNOSIS — M545 Low back pain: Secondary | ICD-10-CM | POA: Insufficient documentation

## 2014-09-23 DIAGNOSIS — Z5181 Encounter for therapeutic drug level monitoring: Secondary | ICD-10-CM | POA: Diagnosis present

## 2014-09-23 DIAGNOSIS — M4316 Spondylolisthesis, lumbar region: Secondary | ICD-10-CM

## 2014-09-23 DIAGNOSIS — E1141 Type 2 diabetes mellitus with diabetic mononeuropathy: Secondary | ICD-10-CM

## 2014-09-23 DIAGNOSIS — G587 Mononeuritis multiplex: Secondary | ICD-10-CM

## 2014-09-23 DIAGNOSIS — M961 Postlaminectomy syndrome, not elsewhere classified: Secondary | ICD-10-CM | POA: Diagnosis not present

## 2014-09-23 DIAGNOSIS — Z79899 Other long term (current) drug therapy: Secondary | ICD-10-CM

## 2014-09-23 MED ORDER — OPANA ER 30 MG PO T12A: 1.0000 | EXTENDED_RELEASE_TABLET | Freq: Two times a day (BID) | ORAL | Status: DC

## 2014-09-23 MED ORDER — OXYCODONE-ACETAMINOPHEN 10-325 MG PO TABS: 1.0000 | ORAL_TABLET | ORAL | Status: DC | PRN

## 2014-09-23 NOTE — Progress Notes (Signed)
64 year old male who is followed chronically at this clinic for postlaminectomy syndrome following L4-5 L5-S1 fusion many years ago. He developed over time increasing pain in both thighs and MRI evidence of adjacent level degeneration at L3-L4. He was reluctant to undergo surgery despite failure of conservative care but eventually underwent L3-L4 decompression and extension of fusion. Postoperatively his thigh pain has improved dramatically. He continues to have low back pain. He has followed up with neurosurgery, he has not had any physical therapy   Neuro:  Eyes without evidence of nystagmus  Tone is normal without evidence of spasticity  No evidence of trunkal ataxia  Motor strength is 5/5 in bilateral  hip flexors, knee flexors and extensors, ankle dorsiflexors, plantar flexors, invertors and evertors, toe flexors and extensors  Sensory exam is normal to  light touch in the Thighs  Impression 1. Lumbar postlaminectomy syndrome with both axial and radicular pain. His radicular pain has improved after decompression and fusion at L3-L4. We will reduce his Lyrica to 100 mg twice a day Continue Opana ER 30 mg twice a day Reduce oxycodone 10 mg from 100 tablets per month to 90 tablets per month. He has approximately 2 weeks left on his Current prescription so that his next visit can be in about 5-6 weeks  We discussed his brace, needs to wear outdoors as well as on steps We discussed physical therapy. He may benefit from this since he's been slow to mobilize. He will discuss this further with his neurosurgeon  2. Diabetic neuropathy patient will monitor his symptoms to see whether there is increasing foot paresthesias and dysesthesias following reduction of Lyrica dosage

## 2014-09-23 NOTE — Progress Notes (Signed)
Subjective:    Patient ID: Anthony Moran, male    DOB: [birth year], 64 y.o.   MRN: [id number]  HPI   Pain Inventory Average Pain 5 Pain Right Now 6 My pain is sharp, burning, stabbing, tingling and aching  In the last 24 hours, has pain interfered with the following? General activity 5 Relation with others 8 Enjoyment of life 7 What TIME of day is your pain at its worst? morning Sleep (in general) Poor  Pain is worse with: walking, bending, sitting, inactivity and standing Pain improves with: rest, heat/ice and medication Relief from Meds: 7  Mobility walk without assistance ability to climb steps?  no do you drive?  no Do you have any goals in this area?  yes  Function disabled: date disabled . I need assistance with the following:  dressing, bathing, meal prep, household duties and shopping Do you have any goals in this area?  yes  Neuro/Psych weakness numbness tingling trouble walking spasms dizziness confusion depression anxiety  Prior Studies Any changes since last visit?  no  Physicians involved in your care Any changes since last visit?  no   Family History  Problem Relation Age of Onset  . Breast cancer Maternal Aunt   . Prostate cancer Father   . Hypertension Sister    History   Social History  . Marital Status: Married    Spouse Name: N/A  . Number of Children: 3  . Years of Education: N/A   Occupational History  .     Social History Main Topics  . Smoking status: Never Smoker   . Smokeless tobacco: Never Used  . Alcohol Use: No     Comment: rare  . Drug Use: No  . Sexual Activity: Not on file   Other Topics Concern  . None   Social History Narrative   Past Surgical History  Procedure Laterality Date  . Lumbar fusion      L 4-5, L5 si   Past Medical History  Diagnosis Date  . Hypertension   . Diabetes mellitus   . Anxiety   . Depression   . Arthritis   . Asthma   . Cervical spondylosis without myelopathy   .  Spinal stenosis in cervical region   . Lumbar post-laminectomy syndrome   . Lumbosacral neuritis   . Mononeuritis multiplex    BP 149/81 mmHg  Pulse 91  Resp 14  SpO2 98%  Opioid Risk Score:   Fall Risk Score:  `1  Depression screen PHQ 2/9  Depression screen PHQ 2/9 08/26/2014  Decreased Interest 3  Down, Depressed, Hopeless 0  PHQ - 2 Score 3  Altered sleeping 3  Tired, decreased energy 1  Change in appetite 3  Feeling bad or failure about yourself  0  Trouble concentrating 3  Moving slowly or fidgety/restless 1  Suicidal thoughts 0  PHQ-9 Score 14     Review of Systems  Constitutional:       Night sweats Poor appetite Loss of taste and smell Weight loss  Respiratory: Positive for wheezing.   Cardiovascular: Positive for leg swelling.  Gastrointestinal: Positive for nausea, abdominal pain and constipation.  Endocrine:       High blood sugar  Musculoskeletal: Positive for gait problem.  Neurological: Positive for dizziness, weakness and numbness.       Tingling  Spasms   Psychiatric/Behavioral: Positive for confusion and dysphoric mood. The patient is nervous/anxious.   All other systems reviewed and are negative.  Objective:   Physical Exam        Assessment & Plan:

## 2014-09-23 NOTE — Addendum Note (Signed)
Addended by: Geryl Rankins D on: 09/23/2014 02:30 PM   Modules accepted: Orders

## 2014-09-23 NOTE — Patient Instructions (Signed)
Reduce lyrica to twice a day

## 2014-09-24 LAB — PMP ALCOHOL METABOLITE (ETG)

## 2014-09-29 LAB — OPIATES/OPIOIDS (LC/MS-MS)
CODEINE URINE: NEGATIVE ng/mL (ref <50)
Hydrocodone: NEGATIVE ng/mL (ref <50)
Hydromorphone: NEGATIVE ng/mL (ref <50)
Morphine Urine: NEGATIVE ng/mL (ref <50)
Norhydrocodone, Ur: NEGATIVE ng/mL (ref <50)
Noroxycodone, Ur: 6406 ng/mL (ref <50)
OXYCODONE, UR: 6062 ng/mL (ref <50)
OXYMORPHONE, URINE: 46562 ng/mL (ref <50)

## 2014-09-29 LAB — BENZODIAZEPINES (GC/LC/MS), URINE
Alprazolam metabolite (GC/LC/MS), ur confirm: NEGATIVE ng/mL (ref <25)
CLONAZEPAU: NEGATIVE ng/mL (ref <25)
Flurazepam metabolite (GC/LC/MS), ur confirm: NEGATIVE ng/mL (ref <50)
Lorazepam (GC/LC/MS), ur confirm: NEGATIVE ng/mL (ref <50)
Midazolam (GC/LC/MS), ur confirm: NEGATIVE ng/mL (ref <50)
Nordiazepam (GC/LC/MS), ur confirm: NEGATIVE ng/mL — AB (ref <50)
Oxazepam (GC/LC/MS), ur confirm: 282 ng/mL (ref <50)
Temazepam (GC/LC/MS), ur confirm: 340 ng/mL (ref <50)
Triazolam metabolite (GC/LC/MS), ur confirm: NEGATIVE ng/mL (ref <50)

## 2014-09-29 LAB — OXYCODONE, URINE (LC/MS-MS)
Noroxycodone, Ur: 6406 ng/mL (ref <50)
OXYCODONE, UR: 6062 ng/mL (ref <50)
Oxymorphone: 46562 ng/mL (ref <50)

## 2014-09-29 LAB — ETHYL GLUCURONIDE, URINE
ETHYL SULFATE (ETS): NEGATIVE ng/mL (ref <100)
Ethyl Glucuronide (EtG): NEGATIVE ng/mL (ref <500)

## 2014-09-30 LAB — PRESCRIPTION MONITORING PROFILE (SOLSTAS)
Amphetamine/Meth: NEGATIVE ng/mL
BARBITURATE SCREEN, URINE: NEGATIVE ng/mL
Buprenorphine, Urine: NEGATIVE ng/mL
CANNABINOID SCRN UR: NEGATIVE ng/mL
CARISOPRODOL, URINE: NEGATIVE ng/mL
COCAINE METABOLITES: NEGATIVE ng/mL
CREATININE, URINE: 517.42 mg/dL (ref >20.0)
Fentanyl, Ur: NEGATIVE ng/mL
MDMA URINE: NEGATIVE ng/mL
METHADONE SCREEN, URINE: NEGATIVE ng/mL
Meperidine, Ur: NEGATIVE ng/mL
Nitrites, Initial: NEGATIVE ug/mL
Propoxyphene: NEGATIVE ng/mL
TAPENTADOLUR: NEGATIVE ng/mL
Tramadol Scrn, Ur: NEGATIVE ng/mL
Zolpidem, Urine: NEGATIVE ng/mL
pH, Initial: 5.1 pH (ref 4.5–8.9)

## 2014-10-08 NOTE — Progress Notes (Signed)
Urine drug screen for this encounter is consistent for prescribed medication 

## 2014-10-27 ENCOUNTER — Encounter: Payer: Medicaid Other | Attending: Physical Medicine & Rehabilitation | Admitting: Registered Nurse

## 2014-10-27 ENCOUNTER — Encounter: Payer: Self-pay | Admitting: Registered Nurse

## 2014-10-27 VITALS — BP 156/84 | HR 96 | Resp 14

## 2014-10-27 DIAGNOSIS — M545 Low back pain: Secondary | ICD-10-CM | POA: Diagnosis present

## 2014-10-27 DIAGNOSIS — G894 Chronic pain syndrome: Secondary | ICD-10-CM

## 2014-10-27 DIAGNOSIS — Z5181 Encounter for therapeutic drug level monitoring: Secondary | ICD-10-CM | POA: Diagnosis not present

## 2014-10-27 DIAGNOSIS — Z79899 Other long term (current) drug therapy: Secondary | ICD-10-CM | POA: Diagnosis present

## 2014-10-27 DIAGNOSIS — M4802 Spinal stenosis, cervical region: Secondary | ICD-10-CM

## 2014-10-27 DIAGNOSIS — M961 Postlaminectomy syndrome, not elsewhere classified: Secondary | ICD-10-CM | POA: Insufficient documentation

## 2014-10-27 DIAGNOSIS — M4316 Spondylolisthesis, lumbar region: Secondary | ICD-10-CM | POA: Diagnosis not present

## 2014-10-27 MED ORDER — OXYCODONE-ACETAMINOPHEN 10-325 MG PO TABS: 1.0000 | ORAL_TABLET | ORAL | Status: DC | PRN

## 2014-10-27 MED ORDER — OPANA ER 30 MG PO T12A: 1.0000 | EXTENDED_RELEASE_TABLET | Freq: Two times a day (BID) | ORAL | Status: DC

## 2014-10-27 NOTE — Progress Notes (Signed)
Subjective:    Patient ID: Anthony Moran, male    DOB: 1951/05/28, 64 y.o.   MRN: [id number]  HPI: [name] is 64 year old male who returns for follow up for chronic pain and medication refill. He says his pain is located in his lower back radiating into his bilateral hip's. He rates his pain 5. His current exercise regime is walking for short distances.    On 07/05/2014 Dr. Arnoldo Morale Performed: L3-4 laminectomy with posterior lumbar interbody fusion with interbody prosthesis posterior lateral arthrodesis and posterior segmental instrumentation and exploration of previous fusion.  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 General activity 9 Relation with others 7 Enjoyment of life 9 What TIME of day is your pain at its worst? morning Sleep (in general) Poor  Pain is worse with: walking, bending, sitting, inactivity and standing Pain improves with: rest, heat/ice and medication Relief from Meds: 5  Mobility walk without assistance ability to climb steps?  no do you drive?  no transfers alone Do you have any goals in this area?  yes  Function disabled: date disabled . I need assistance with the following:  meal prep, household duties and shopping Do you have any goals in this area?  yes  Neuro/Psych weakness numbness tremor tingling trouble walking spasms dizziness confusion anxiety loss of taste or smell  Prior Studies Any changes since last visit?  no  Physicians involved in your care Any changes since last visit?  no   Family History  Problem Relation Age of Onset  . Breast cancer Maternal Aunt   . Prostate cancer Father   . Hypertension  Sister    History   Social History  . Marital Status: Married    Spouse Name: N/A  . Number of Children: 3  . Years of Education: N/A   Occupational History  .     Social History Main Topics  . Smoking status: Never Smoker   . Smokeless tobacco: Never Used  . Alcohol Use: No     Comment: rare  . Drug Use: No  . Sexual Activity: Not on file   Other Topics Concern  . None   Social History Narrative   Past Surgical History  Procedure Laterality Date  . Lumbar fusion      L 4-5, L5 si   Past Medical History  Diagnosis Date  . Hypertension   . Diabetes mellitus   . Anxiety   . Depression   . Arthritis   . Asthma   . Cervical spondylosis without myelopathy   . Spinal stenosis in cervical region   . Lumbar post-laminectomy syndrome   . Lumbosacral neuritis   . Mononeuritis multiplex    BP 156/84 mmHg  Pulse 96  Resp 14  SpO2 97%  Opioid Risk Score:   Fall Risk Score: Moderate Fall Risk (6-13 points)`1  Depression screen PHQ 2/9  Depression screen PHQ 2/9 08/26/2014  Decreased Interest 3  Down, Depressed, Hopeless 0  PHQ - 2 Score 3  Altered sleeping 3  Tired, decreased energy 1  Change in appetite 3  Feeling bad or failure about yourself  0  Trouble concentrating 3  Moving slowly or fidgety/restless 1  Suicidal thoughts 0  PHQ-9 Score 14     Review of Systems  Constitutional:       Loss of taste and smell Night sweats Weight gain Poor appetite  Respiratory: Positive for wheezing.   Cardiovascular: Positive for leg swelling.  Gastrointestinal: Positive for nausea and constipation.  Endocrine:       High blood sugar  Musculoskeletal: Positive for joint swelling and gait problem.  Neurological: Positive for tremors, weakness and numbness.       Tingling spasms  Psychiatric/Behavioral: The patient is nervous/anxious.   All other systems reviewed and are negative.      Objective:   Physical Exam  Constitutional: He is oriented to person,  place, and time. He appears well-developed and well-nourished.  HENT:  Head: Normocephalic and atraumatic.  Neck: Normal range of motion. Neck supple.  Cardiovascular: Normal rate and regular rhythm.   Pulmonary/Chest: Effort normal and breath sounds normal.  Musculoskeletal:  Normal Muscle Bulk and Muscle Testing Reveals: Upper Extremities: Full ROM and Muscle Strength 5/5 Lumbar Paraspinal Tenderness: L-3-L-5 Lower Extremities: Full ROM and Muscle Strength 5/5 Arises from chair with ease  Narrow Based Gait  Neurological: He is alert and oriented to person, place, and time.  Skin: Skin is warm and dry.  Psychiatric: He has a normal mood and affect.  Nursing note and vitals reviewed.         Assessment & Plan:  1. Lumbar post lami syndrome:   RX: Oxycodone increased to 10 mg/325 mg one tablet 3 times a day #90 and Opana 30 mg one tablet every 12 hours #60.  2. Cervical stenosis: Continue exercise and ice therapy and current medication regime.  3. Diabetes with mononeuropathy multiplex: Continue with Cymbalta and Lyrica.   20 minutes of face to face patient care time was spent during this visit. All questions were encouraged and answered.   F/U in 1 month.

## 2014-10-28 ENCOUNTER — Ambulatory Visit: Payer: Medicaid Other | Admitting: Physical Medicine & Rehabilitation

## 2014-11-29 ENCOUNTER — Encounter: Payer: Self-pay | Admitting: Registered Nurse

## 2014-11-29 ENCOUNTER — Encounter: Payer: Medicaid Other | Attending: Physical Medicine & Rehabilitation | Admitting: Registered Nurse

## 2014-11-29 VITALS — BP 139/85 | HR 92 | Resp 16

## 2014-11-29 DIAGNOSIS — M4316 Spondylolisthesis, lumbar region: Secondary | ICD-10-CM | POA: Diagnosis not present

## 2014-11-29 DIAGNOSIS — M961 Postlaminectomy syndrome, not elsewhere classified: Secondary | ICD-10-CM | POA: Insufficient documentation

## 2014-11-29 DIAGNOSIS — Z79899 Other long term (current) drug therapy: Secondary | ICD-10-CM | POA: Insufficient documentation

## 2014-11-29 DIAGNOSIS — G609 Hereditary and idiopathic neuropathy, unspecified: Secondary | ICD-10-CM

## 2014-11-29 DIAGNOSIS — M47812 Spondylosis without myelopathy or radiculopathy, cervical region: Secondary | ICD-10-CM

## 2014-11-29 DIAGNOSIS — G894 Chronic pain syndrome: Secondary | ICD-10-CM

## 2014-11-29 DIAGNOSIS — Z5181 Encounter for therapeutic drug level monitoring: Secondary | ICD-10-CM

## 2014-11-29 DIAGNOSIS — M545 Low back pain: Secondary | ICD-10-CM | POA: Insufficient documentation

## 2014-11-29 MED ORDER — OPANA ER 30 MG PO T12A: 1.0000 | EXTENDED_RELEASE_TABLET | Freq: Two times a day (BID) | ORAL | Status: DC

## 2014-11-29 MED ORDER — OXYCODONE-ACETAMINOPHEN 10-325 MG PO TABS: 1.0000 | ORAL_TABLET | ORAL | Status: DC | PRN

## 2014-11-29 MED ORDER — OXYCODONE-ACETAMINOPHEN 10-325 MG PO TABS: 1.0000 | ORAL_TABLET | Freq: Three times a day (TID) | ORAL | Status: DC | PRN

## 2014-11-29 NOTE — Progress Notes (Signed)
Subjective:    Patient ID: Anthony Moran, male    DOB: [birth year], 64 y.o.   MRN: [id number]  HPI: [name] is 64 year old male who returns for follow up for chronic pain and medication refill. He says his pain is located in his lower back radiating into his bilateral hip's. He rates his pain 5. His current exercise regime is walking for short distances.   On 07/05/2014 Dr. Arnoldo Morale Performed: L3-4 laminectomy with posterior lumbar interbody fusion with interbody prosthesis posterior lateral arthrodesis and posterior segmental instrumentation and exploration of previous fusion.  One script was discarded due to error entry.  Pain Inventory Average Pain 5 Pain Right Now 5 My pain is sharp, dull, stabbing and aching  In the last 24 hours, has pain interfered with the following? General activity 8 Relation with others 5 Enjoyment of life 9 What TIME of day is your pain at its worst? morning Sleep (in general) Poor  Pain is worse with: walking, bending, sitting, inactivity and standing Pain improves with: rest, heat/ice and medication Relief from Meds: 5  Mobility walk without assistance ability to climb steps?  no do you drive?  no  Function disabled: date disabled . I need assistance with the following:  meal prep, household duties and shopping  Neuro/Psych weakness numbness tingling trouble walking spasms dizziness confusion depression anxiety loss of taste or smell  Prior Studies Any changes since last visit?  no  Physicians involved in your care Any changes since last visit?  no   Family History  Problem Relation Age of Onset  . Breast cancer Maternal Aunt   . Prostate cancer Father   . Hypertension Sister    History   Social History  . Marital Status: Married    Spouse Name: N/A  . Number of Children: 3  . Years of Education: N/A   Occupational History  .     Social History Main Topics  . Smoking status: Never Smoker   .  Smokeless tobacco: Never Used  . Alcohol Use: No     Comment: rare  . Drug Use: No  . Sexual Activity: Not on file   Other Topics Concern  . None   Social History Narrative   Past Surgical History  Procedure Laterality Date  . Lumbar fusion      L 4-5, L5 si   Past Medical History  Diagnosis Date  . Hypertension   . Diabetes mellitus   . Anxiety   . Depression   . Arthritis   . Asthma   . Cervical spondylosis without myelopathy   . Spinal stenosis in cervical region   . Lumbar post-laminectomy syndrome   . Lumbosacral neuritis   . Mononeuritis multiplex    BP 139/85 mmHg  Pulse 92  Resp 16  SpO2 96%  Opioid Risk Score:   Fall Risk Score: Low Fall Risk (0-5 points) (previously educated and given handout)`1  Depression screen PHQ 2/9  Depression screen PHQ 2/9 08/26/2014  Decreased Interest 3  Down, Depressed, Hopeless 0  PHQ - 2 Score 3  Altered sleeping 3  Tired, decreased energy 1  Change in appetite 3  Feeling bad or failure about yourself  0  Trouble concentrating 3  Moving slowly or fidgety/restless 1  Suicidal thoughts 0  PHQ-9 Score 14     Review of Systems  Constitutional: Positive for diaphoresis, appetite change and unexpected weight change.  Respiratory: Positive for wheezing.   Cardiovascular: Positive for leg swelling.  Gastrointestinal: Positive for nausea, vomiting, abdominal pain and constipation.  Endocrine:       Blood sugar fluctuations  Musculoskeletal: Positive for gait problem.       Spasms  Neurological: Positive for dizziness, weakness and numbness.       Tingling  Psychiatric/Behavioral: Positive for confusion and dysphoric mood. The patient is nervous/anxious.   All other systems reviewed and are negative.      Objective:   Physical Exam  Constitutional: He is oriented to person, place, and time. He appears well-developed and well-nourished.  HENT:  Head: Normocephalic and atraumatic.  Neck: Normal range of motion.  Neck supple.  Cardiovascular: Normal rate and regular rhythm.   Pulmonary/Chest: Effort normal and breath sounds normal.  Musculoskeletal:  Normal Muscle Bulk and Muscle Testing Reveals:  Upper Extremities: Full ROM and Muscle Strength 5/5 Lumbar Paraspinal Tenderness: L-3- L-5  ( Mainly Left Side) Lower Extremities: Full ROM and Muscle Strength 5/5 Arises from chair with ease Narrow Based Gait   Neurological: He is alert and oriented to person, place, and time.  Skin: Skin is warm and dry.  Psychiatric: He has a normal mood and affect.  Nursing note and vitals reviewed.         Assessment & Plan:  1. Lumbar post lami syndrome:  RX: Oxycodone increased to 10 mg/325 mg one tablet 3 times a day #90 and Opana 30 mg one tablet every 12 hours #60.  2. Cervical stenosis: Continue exercise and ice therapy and current medication regime.  3. Diabetes with mononeuropathy multiplex: Continue with Cymbalta and Lyrica.   20 minutes of face to face patient care time was spent during this visit. All questions were encouraged and answered.   F/U in 1 month.

## 2015-01-03 ENCOUNTER — Encounter: Payer: Self-pay | Admitting: Registered Nurse

## 2015-01-03 ENCOUNTER — Encounter: Payer: Medicaid Other | Attending: Physical Medicine & Rehabilitation | Admitting: Registered Nurse

## 2015-01-03 VITALS — BP 134/78 | HR 80 | Resp 14

## 2015-01-03 DIAGNOSIS — M545 Low back pain: Secondary | ICD-10-CM | POA: Diagnosis present

## 2015-01-03 DIAGNOSIS — Z5181 Encounter for therapeutic drug level monitoring: Secondary | ICD-10-CM

## 2015-01-03 DIAGNOSIS — G894 Chronic pain syndrome: Secondary | ICD-10-CM | POA: Diagnosis not present

## 2015-01-03 DIAGNOSIS — G609 Hereditary and idiopathic neuropathy, unspecified: Secondary | ICD-10-CM | POA: Diagnosis not present

## 2015-01-03 DIAGNOSIS — M4316 Spondylolisthesis, lumbar region: Secondary | ICD-10-CM

## 2015-01-03 DIAGNOSIS — Z79899 Other long term (current) drug therapy: Secondary | ICD-10-CM | POA: Diagnosis present

## 2015-01-03 DIAGNOSIS — M961 Postlaminectomy syndrome, not elsewhere classified: Secondary | ICD-10-CM | POA: Diagnosis not present

## 2015-01-03 MED ORDER — OXYCODONE-ACETAMINOPHEN 10-325 MG PO TABS: 1.0000 | ORAL_TABLET | Freq: Three times a day (TID) | ORAL | Status: DC | PRN

## 2015-01-03 MED ORDER — OPANA ER 30 MG PO T12A
1.0000 | EXTENDED_RELEASE_TABLET | Freq: Two times a day (BID) | ORAL | Status: DC
Start: 2015-01-03 — End: 2015-02-02

## 2015-01-03 NOTE — Progress Notes (Signed)
Subjective:    Patient ID: Anthony Moran, male    DOB: [birth year], 64 y.o.   MRN: [id number]  HPI: Mr. Karsten Howry is 64 year old male who returns for follow up for chronic pain and medication refill. He says his pain is located in his mid-lower back mainly left side. He rates his pain 5. His current exercise regime is walking he states he's constantly walking up the steps in his home.  On 07/05/2014 Dr. Arnoldo Morale Performed: L3-4 laminectomy with posterior lumbar interbody fusion with interbody prosthesis posterior lateral arthrodesis and posterior segmental instrumentation and exploration of previous fusion.   Pain Inventory Average Pain 6 Pain Right Now 5 My pain is constant, sharp, burning, dull, stabbing, tingling and aching  In the last 24 hours, has pain interfered with the following? General activity 8 Relation with others 9 Enjoyment of life 10 What TIME of day is your pain at its worst? morning Sleep (in general) Poor  Pain is worse with: walking, bending, sitting, inactivity and standing Pain improves with: rest Relief from Meds: 6  Mobility walk without assistance how many minutes can you walk? 5 ability to climb steps?  yes do you drive?  no  Function disabled: date disabled .  Neuro/Psych weakness numbness tingling trouble walking spasms dizziness confusion anxiety  Prior Studies Any changes since last visit?  no  Physicians involved in your care Any changes since last visit?  no   Family History  Problem Relation Age of Onset  . Breast cancer Maternal Aunt   . Prostate cancer Father   . Hypertension Sister    History   Social History  . Marital Status: Married    Spouse Name: N/A  . Number of Children: 3  . Years of Education: N/A   Occupational History  .     Social History Main Topics  . Smoking status: Never Smoker   . Smokeless tobacco: Never Used  . Alcohol Use: No     Comment: rare  . Drug Use: No  . Sexual Activity:  Not on file   Other Topics Concern  . None   Social History Narrative   Past Surgical History  Procedure Laterality Date  . Lumbar fusion      L 4-5, L5 si   Past Medical History  Diagnosis Date  . Hypertension   . Diabetes mellitus   . Anxiety   . Depression   . Arthritis   . Asthma   . Cervical spondylosis without myelopathy   . Spinal stenosis in cervical region   . Lumbar post-laminectomy syndrome   . Lumbosacral neuritis   . Mononeuritis multiplex    BP 134/78 mmHg  Pulse 80  Resp 14  SpO2 97%  Opioid Risk Score:   Fall Risk Score:  `1  Depression screen PHQ 2/9  Depression screen [facility] 2/9 01/03/2015 08/26/2014  Decreased Interest 0 3  Down, Depressed, Hopeless 0 0  PHQ - 2 Score 0 3  Altered sleeping - 3  Tired, decreased energy - 1  Change in appetite - 3  Feeling bad or failure about yourself  - 0  Trouble concentrating - 3  Moving slowly or fidgety/restless - 1  Suicidal thoughts - 0  PHQ-9 Score - 14     Review of Systems  Constitutional: Positive for appetite change.       Night sweats, weight loss, high blood sugar - did not test this AM  HENT: Negative.   Eyes: Negative.  Respiratory: Positive for wheezing.   Cardiovascular: Positive for leg swelling.  Gastrointestinal: Positive for nausea and constipation.  Endocrine: Negative.   Genitourinary: Negative.   Musculoskeletal: Positive for myalgias, back pain, arthralgias and neck pain.  Skin: Negative.   Allergic/Immunologic: Negative.   Neurological: Positive for weakness and numbness.       Tingling, trouble walking, spasms  Hematological: Negative.   Psychiatric/Behavioral: Positive for confusion. The patient is nervous/anxious.        Objective:   Physical Exam  Constitutional: He is oriented to person, place, and time. He appears well-developed and well-nourished.  HENT:  Head: Normocephalic and atraumatic.  Neck: Normal range of motion. Neck supple.  Cardiovascular: Normal  rate and regular rhythm.   Pulmonary/Chest: Effort normal and breath sounds normal.  Musculoskeletal:  Normal Muscle Bulk and Muscle testing Reveals: Upper Extremities: Full ROM and Muscle Strength 5/5 Thoracic Paraspinal Tenderness: T-7-T-12 Mainly Left Side Lumbar Paraspinal Tenderness: L- 3- L-5 Lower Extremities: Full ROM and Muscle Strength 5/5 Arises from chair with ease Narrow Based Gait  Neurological: He is alert and oriented to person, place, and time.  Skin: Skin is warm and dry.  Psychiatric: He has a normal mood and affect.  Nursing note and vitals reviewed.         Assessment & Plan:  1. Lumbar post lami syndrome:  RX: Oxycodone increased to 10 mg/325 mg one tablet 3 times a day #90 and Opana 30 mg one tablet every 12 hours #60.  2. Cervical stenosis: Continue exercise and ice therapy and current medication regime.  3. Diabetes with mononeuropathy multiplex: Continue with Cymbalta and Lyrica.   20 minutes of face to face patient care time was spent during this visit. All questions were encouraged and answered.   F/U in 1 month.

## 2015-01-11 ENCOUNTER — Other Ambulatory Visit: Payer: Self-pay | Admitting: Physical Medicine & Rehabilitation

## 2015-02-02 ENCOUNTER — Encounter: Payer: Medicaid Other | Attending: Physical Medicine & Rehabilitation | Admitting: Registered Nurse

## 2015-02-02 ENCOUNTER — Encounter: Payer: Self-pay | Admitting: Registered Nurse

## 2015-02-02 VITALS — BP 137/90 | HR 84 | Resp 14

## 2015-02-02 DIAGNOSIS — Z79899 Other long term (current) drug therapy: Secondary | ICD-10-CM | POA: Diagnosis not present

## 2015-02-02 DIAGNOSIS — M961 Postlaminectomy syndrome, not elsewhere classified: Secondary | ICD-10-CM | POA: Insufficient documentation

## 2015-02-02 DIAGNOSIS — M4316 Spondylolisthesis, lumbar region: Secondary | ICD-10-CM | POA: Diagnosis not present

## 2015-02-02 DIAGNOSIS — G609 Hereditary and idiopathic neuropathy, unspecified: Secondary | ICD-10-CM

## 2015-02-02 DIAGNOSIS — Z5181 Encounter for therapeutic drug level monitoring: Secondary | ICD-10-CM

## 2015-02-02 DIAGNOSIS — M545 Low back pain: Secondary | ICD-10-CM | POA: Diagnosis present

## 2015-02-02 DIAGNOSIS — G894 Chronic pain syndrome: Secondary | ICD-10-CM

## 2015-02-02 MED ORDER — OXYCODONE-ACETAMINOPHEN 10-325 MG PO TABS: 1.0000 | ORAL_TABLET | Freq: Three times a day (TID) | ORAL | Status: DC | PRN

## 2015-02-02 MED ORDER — OPANA ER 30 MG PO T12A: 1.0000 | EXTENDED_RELEASE_TABLET | Freq: Two times a day (BID) | ORAL | Status: DC

## 2015-02-02 NOTE — Progress Notes (Signed)
Subjective:    Patient ID: Anthony Moran, male    DOB: [birth year], 64 y.o.   MRN: [id number]  HPI: Mr. Ioane Bhola is 64 year old male who returns for follow up for chronic pain and medication refill. He says his pain is located in hislower back mainly left side. He rates his pain 5. His current exercise regime is walking and performing stretching exercises.  On 07/05/2014 Dr. Arnoldo Morale Performed: L3-4 laminectomy with posterior lumbar interbody fusion with interbody prosthesis posterior lateral arthrodesis and posterior segmental instrumentation and exploration of previous fusion.   Pain Inventory Average Pain 5 Pain Right Now 5 My pain is sharp, burning, dull, stabbing, tingling and aching  In the last 24 hours, has pain interfered with the following? General activity 4 Relation with others 10 Enjoyment of life 2 What TIME of day is your pain at its worst? morning Sleep (in general) Poor  Pain is worse with: walking, bending, sitting, inactivity and standing Pain improves with: rest, heat/ice and medication Relief from Meds: 5  Mobility Do you have any goals in this area?  yes  Function disabled: date disabled . I need assistance with the following:  meal prep, household duties and shopping  Neuro/Psych weakness numbness tremor tingling trouble walking spasms dizziness confusion anxiety loss of taste or smell  Prior Studies Any changes since last visit?  no  Physicians involved in your care Any changes since last visit?  no   Family History  Problem Relation Age of Onset  . Breast cancer Maternal Aunt   . Prostate cancer Father   . Hypertension Sister    Social History   Social History  . Marital Status: Married    Spouse Name: N/A  . Number of Children: 3  . Years of Education: N/A   Occupational History  .     Social History Main Topics  . Smoking status: Never Smoker   . Smokeless tobacco: Never Used  . Alcohol Use: No     Comment:  rare  . Drug Use: No  . Sexual Activity: Not Asked   Other Topics Concern  . None   Social History Narrative   Past Surgical History  Procedure Laterality Date  . Lumbar fusion      L 4-5, L5 si   Past Medical History  Diagnosis Date  . Hypertension   . Diabetes mellitus   . Anxiety   . Depression   . Arthritis   . Asthma   . Cervical spondylosis without myelopathy   . Spinal stenosis in cervical region   . Lumbar post-laminectomy syndrome   . Lumbosacral neuritis   . Mononeuritis multiplex    BP 155/88 mmHg  Pulse 84  Resp 14  SpO2 96%  Opioid Risk Score:   Fall Risk Score:  `1  Depression screen PHQ 2/9  Depression screen [facility] 2/9 01/03/2015 08/26/2014  Decreased Interest 0 3  Down, Depressed, Hopeless 0 0  PHQ - 2 Score 0 3  Altered sleeping - 3  Tired, decreased energy - 1  Change in appetite - 3  Feeling bad or failure about yourself  - 0  Trouble concentrating - 3  Moving slowly or fidgety/restless - 1  Suicidal thoughts - 0  PHQ-9 Score - 14     Review of Systems  Constitutional: Positive for diaphoresis, appetite change and unexpected weight change.       Los of taste/smell  Respiratory: Positive for apnea and wheezing.   Cardiovascular: Positive for  leg swelling.  Gastrointestinal: Positive for nausea, vomiting, abdominal pain and constipation.  Endocrine:       High blood sugar  Neurological: Positive for dizziness, tremors, weakness and numbness.       Tingling Spasms   Psychiatric/Behavioral: Positive for confusion. The patient is nervous/anxious.   All other systems reviewed and are negative.      Objective:   Physical Exam  Constitutional: He is oriented to person, place, and time. He appears well-developed and well-nourished.  HENT:  Head: Normocephalic and atraumatic.  Neck: Normal range of motion. Neck supple.  Cardiovascular: Normal rate and regular rhythm.   Pulmonary/Chest: Effort normal and breath sounds normal.    Musculoskeletal:  Normal Muscle Bulk and Muscle Testing Reveals: Upper Extremities: Full ROM and Muscle Strength 5/5 Lumbar Paraspinal Tenderness: L-1- L-3 Lower Extremities: Full ROM and Muscle Strength 5/5 Arises from chair with ease Narrow Based Gait  Neurological: He is alert and oriented to person, place, and time.  Skin: Skin is warm and dry.  Psychiatric: He has a normal mood and affect.  Nursing note and vitals reviewed.         Assessment & Plan:  1. Lumbar post lami syndrome:  Refilled: Oxycodone increased to 10 mg/325 mg one tablet 3 times a day #90 and Opana 30 mg one tablet every 12 hours #60.  2. Cervical stenosis: Continue exercise and ice therapy and current medication regime.  3. Diabetes with mononeuropathy multiplex: Continue with Cymbalta and Lyrica.   20 minutes of face to face patient care time was spent during this visit. All questions were encouraged and answered.   F/U in 1 month.

## 2015-02-03 ENCOUNTER — Other Ambulatory Visit: Payer: Self-pay | Admitting: Physical Medicine & Rehabilitation

## 2015-02-04 LAB — PMP ALCOHOL METABOLITE (ETG): ETGU: NEGATIVE ng/mL

## 2015-02-09 LAB — OPIATES/OPIOIDS (LC/MS-MS)
CODEINE URINE: NEGATIVE ng/mL (ref <50)
Hydrocodone: NEGATIVE ng/mL (ref <50)
Hydromorphone: NEGATIVE ng/mL (ref <50)
Morphine Urine: NEGATIVE ng/mL (ref <50)
Norhydrocodone, Ur: NEGATIVE ng/mL (ref <50)
Noroxycodone, Ur: 5147 ng/mL (ref <50)
OXYMORPHONE, URINE: 32186 ng/mL (ref <50)
Oxycodone, ur: 2019 ng/mL (ref <50)

## 2015-02-09 LAB — BENZODIAZEPINES (GC/LC/MS), URINE
Alprazolam metabolite (GC/LC/MS), ur confirm: NEGATIVE ng/mL (ref <25)
Clonazepam metabolite (GC/LC/MS), ur confirm: NEGATIVE ng/mL (ref <25)
Flurazepam metabolite (GC/LC/MS), ur confirm: NEGATIVE ng/mL (ref <50)
LORAZEPAMU: NEGATIVE ng/mL (ref <50)
Midazolam (GC/LC/MS), ur confirm: NEGATIVE ng/mL (ref <50)
NORDIAZEPAMU: NEGATIVE ng/mL — AB (ref <50)
Oxazepam (GC/LC/MS), ur confirm: 113 ng/mL (ref <50)
TRIAZOLAMU: NEGATIVE ng/mL (ref <50)
Temazepam (GC/LC/MS), ur confirm: 84 ng/mL (ref <50)

## 2015-02-09 LAB — OXYCODONE, URINE (LC/MS-MS)
NOROXYCODONE, UR: 5147 ng/mL (ref <50)
Oxycodone, ur: 2019 ng/mL (ref <50)
Oxymorphone: 32186 ng/mL (ref <50)

## 2015-02-10 LAB — PRESCRIPTION MONITORING PROFILE (SOLSTAS)
AMPHETAMINE/METH: NEGATIVE ng/mL
BARBITURATE SCREEN, URINE: NEGATIVE ng/mL
BUPRENORPHINE, URINE: NEGATIVE ng/mL
CANNABINOID SCRN UR: NEGATIVE ng/mL
Carisoprodol, Urine: NEGATIVE ng/mL
Cocaine Metabolites: NEGATIVE ng/mL
Creatinine, Urine: 238.56 mg/dL (ref >20.0)
FENTANYL URINE: NEGATIVE ng/mL
MDMA URINE: NEGATIVE ng/mL
METHADONE SCREEN, URINE: NEGATIVE ng/mL
Meperidine, Ur: NEGATIVE ng/mL
NITRITES URINE, INITIAL: NEGATIVE ug/mL
PROPOXYPHENE: NEGATIVE ng/mL
TAPENTADOLUR: NEGATIVE ng/mL
Tramadol Scrn, Ur: NEGATIVE ng/mL
Zolpidem, Urine: NEGATIVE ng/mL
pH, Initial: 5.1 pH (ref 4.5–8.9)

## 2015-02-18 NOTE — Progress Notes (Signed)
Urine drug screen for this encounter is consistent for prescribed medication 

## 2015-03-09 ENCOUNTER — Encounter: Payer: Medicaid Other | Admitting: Registered Nurse

## 2015-03-17 ENCOUNTER — Encounter: Payer: Medicaid Other | Admitting: Registered Nurse

## 2015-04-07 ENCOUNTER — Ambulatory Visit: Payer: Medicaid Other | Admitting: Registered Nurse

## 2015-04-07 ENCOUNTER — Encounter: Payer: Medicaid Other | Attending: Physical Medicine & Rehabilitation

## 2015-04-07 ENCOUNTER — Encounter: Payer: Self-pay | Admitting: Physical Medicine & Rehabilitation

## 2015-04-07 ENCOUNTER — Ambulatory Visit (HOSPITAL_BASED_OUTPATIENT_CLINIC_OR_DEPARTMENT_OTHER): Payer: Medicaid Other | Admitting: Physical Medicine & Rehabilitation

## 2015-04-07 VITALS — BP 130/89 | HR 87 | Resp 16

## 2015-04-07 DIAGNOSIS — Z5181 Encounter for therapeutic drug level monitoring: Secondary | ICD-10-CM | POA: Diagnosis present

## 2015-04-07 DIAGNOSIS — G587 Mononeuritis multiplex: Secondary | ICD-10-CM

## 2015-04-07 DIAGNOSIS — Z79899 Other long term (current) drug therapy: Secondary | ICD-10-CM | POA: Insufficient documentation

## 2015-04-07 DIAGNOSIS — M4316 Spondylolisthesis, lumbar region: Secondary | ICD-10-CM | POA: Diagnosis not present

## 2015-04-07 DIAGNOSIS — E1141 Type 2 diabetes mellitus with diabetic mononeuropathy: Secondary | ICD-10-CM

## 2015-04-07 DIAGNOSIS — M545 Low back pain: Secondary | ICD-10-CM | POA: Diagnosis present

## 2015-04-07 DIAGNOSIS — M961 Postlaminectomy syndrome, not elsewhere classified: Secondary | ICD-10-CM | POA: Insufficient documentation

## 2015-04-07 MED ORDER — PREGABALIN 100 MG PO CAPS: 100.0000 mg | ORAL_CAPSULE | Freq: Three times a day (TID) | ORAL | Status: DC

## 2015-04-07 MED ORDER — OPANA ER 30 MG PO T12A: 1.0000 | EXTENDED_RELEASE_TABLET | Freq: Two times a day (BID) | ORAL | Status: DC

## 2015-04-07 MED ORDER — OXYCODONE-ACETAMINOPHEN 10-325 MG PO TABS: 1.0000 | ORAL_TABLET | Freq: Three times a day (TID) | ORAL | Status: DC | PRN

## 2015-04-07 NOTE — Progress Notes (Signed)
Subjective:    Patient ID: Anthony Moran, male    DOB: October 04, 1950, 64 y.o.   MRN: [id number] Admit date: 07/05/2014 Discharge date: 07/06/2014 Admission Diagnoses: L3-4 spondylolisthesis, spinal stenosis, lumbago, lumbar radiculopathy HPI Lower ext pain improved after decompression Still with low back pain Traveling to [place] to see brother in hospice  Brother with heart disease  Overall he feels like the surgery should've given him more relief. He does admit that lower extremity pain is better. We did discuss that his activity level is better Pain Inventory Average Pain 6 Pain Right Now 5 My pain is sharp, burning, dull, stabbing, tingling and aching  In the last 24 hours, has pain interfered with the following? General activity 7 Relation with others 5 Enjoyment of life 10 What TIME of day is your pain at its worst? morning Sleep (in general) Poor  Pain is worse with: walking, bending, inactivity and standing Pain improves with: rest, heat/ice and medication Relief from Meds: 5  Mobility walk without assistance ability to climb steps?  yes do you drive?  no transfers alone Do you have any goals in this area?  yes  Function disabled: date disabled NA I need assistance with the following:  dressing, bathing, meal prep, household duties and shopping Do you have any goals in this area?  yes  Neuro/Psych weakness numbness tremor tingling trouble walking spasms dizziness confusion depression anxiety  Prior Studies Any changes since last visit?  no  Physicians involved in your care Any changes since last visit?  no   Family History  Problem Relation Age of Onset  . Breast cancer Maternal Aunt   . Prostate cancer Father   . Hypertension Sister    Social History   Social History  . Marital Status: Married    Spouse Name: N/A  . Number of Children: 3  . Years of Education: N/A   Occupational History  .     Social History Main Topics  . Smoking  status: Never Smoker   . Smokeless tobacco: Never Used  . Alcohol Use: No     Comment: rare  . Drug Use: No  . Sexual Activity: Not Asked   Other Topics Concern  . None   Social History Narrative   Past Surgical History  Procedure Laterality Date  . Lumbar fusion      L 4-5, L5 si   Past Medical History  Diagnosis Date  . Hypertension   . Diabetes mellitus   . Anxiety   . Depression   . Arthritis   . Asthma   . Cervical spondylosis without myelopathy   . Spinal stenosis in cervical region   . Lumbar post-laminectomy syndrome   . Lumbosacral neuritis   . Mononeuritis multiplex    BP 130/89 mmHg  Pulse 87  Resp 16  SpO2 100%  Opioid Risk Score:   Fall Risk Score:  `1  Depression screen PHQ 2/9  Depression screen [facility] 2/9 01/03/2015 08/26/2014  Decreased Interest 0 3  Down, Depressed, Hopeless 0 0  PHQ - 2 Score 0 3  Altered sleeping - 3  Tired, decreased energy - 1  Change in appetite - 3  Feeling bad or failure about yourself  - 0  Trouble concentrating - 3  Moving slowly or fidgety/restless - 1  Suicidal thoughts - 0  PHQ-9 Score - 14     Review of Systems  Constitutional: Positive for diaphoresis and unexpected weight change.  Respiratory: Positive for shortness of breath and  wheezing.   Gastrointestinal: Positive for nausea, vomiting, abdominal pain and constipation.       Poor appetite  Endocrine:       High Blood Sugar  Musculoskeletal:       Spasms  Skin: Positive for rash.  Neurological: Positive for dizziness, tremors, weakness and numbness.       Tingling Gait Instabiltity  Psychiatric/Behavioral: Positive for confusion. The patient is nervous/anxious.        Depression  All other systems reviewed and are negative.      Objective:   Physical Exam  Constitutional: He is oriented to person, place, and time. He appears well-developed and well-nourished.  HENT:  Head: Normocephalic and atraumatic.  Eyes: Conjunctivae and EOM are normal.  Pupils are equal, round, and reactive to light.  Neck: Normal range of motion.  Neurological: He is alert and oriented to person, place, and time.  Psychiatric: He has a normal mood and affect.  Nursing note and vitals reviewed.  Lumbar spine incision well healed. No evidence of spine deformity other than flat back Patient has negative straight leg raising Lower extremity strength is 5/5 bilateral hip flexor and knee extensor ankle dorsi flexor plantar flexor Gait is without evidence toe drag or knee instability there is no assisted device.         Assessment & Plan:  1. Lumbar postlaminectomy syndrome with chronic low back pain. He no longer has significant radicular discomfort. His axial pain has not improved postoperatively however Patient now has a fusion extending from L3-S1 Opana ER 30 mg twice a day Oxycodone 10 mg 3 times a day  No signs of misuse or abuse of his narcotic analgesic medication Continue opioid monitoring program. This consists of regular clinic visits, examinations, urine drug screen, pill counts as well as use of [place] controlled substance reporting System.  2. Diabetes with neuropathy continue Lyrica 100 mg 3 times a day

## 2015-04-14 ENCOUNTER — Telehealth: Payer: Self-pay | Admitting: *Deleted

## 2015-04-14 NOTE — Telephone Encounter (Signed)
Miss [place]on called requesting that we submit a prior authorization for [facility] Lyrica rx.  Prior auth was sent to [place] [facility], left message on miss fosters VM stating that PA was sent in

## 2015-05-04 ENCOUNTER — Encounter: Payer: Medicaid Other | Attending: Physical Medicine & Rehabilitation | Admitting: Registered Nurse

## 2015-05-04 ENCOUNTER — Encounter: Payer: Self-pay | Admitting: Registered Nurse

## 2015-05-04 VITALS — BP 140/73 | HR 74 | Resp 14

## 2015-05-04 DIAGNOSIS — M4316 Spondylolisthesis, lumbar region: Secondary | ICD-10-CM

## 2015-05-04 DIAGNOSIS — Z79899 Other long term (current) drug therapy: Secondary | ICD-10-CM | POA: Diagnosis present

## 2015-05-04 DIAGNOSIS — G587 Mononeuritis multiplex: Secondary | ICD-10-CM

## 2015-05-04 DIAGNOSIS — E1141 Type 2 diabetes mellitus with diabetic mononeuropathy: Secondary | ICD-10-CM | POA: Diagnosis not present

## 2015-05-04 DIAGNOSIS — M545 Low back pain: Secondary | ICD-10-CM | POA: Insufficient documentation

## 2015-05-04 DIAGNOSIS — G894 Chronic pain syndrome: Secondary | ICD-10-CM

## 2015-05-04 DIAGNOSIS — M961 Postlaminectomy syndrome, not elsewhere classified: Secondary | ICD-10-CM | POA: Diagnosis not present

## 2015-05-04 DIAGNOSIS — Z5181 Encounter for therapeutic drug level monitoring: Secondary | ICD-10-CM | POA: Diagnosis present

## 2015-05-04 MED ORDER — OXYCODONE-ACETAMINOPHEN 10-325 MG PO TABS: 1.0000 | ORAL_TABLET | Freq: Three times a day (TID) | ORAL | Status: DC | PRN

## 2015-05-04 MED ORDER — OPANA ER 30 MG PO T12A: 1.0000 | EXTENDED_RELEASE_TABLET | Freq: Two times a day (BID) | ORAL | Status: DC

## 2015-05-04 NOTE — Progress Notes (Deleted)
Subjective:    Patient ID: Anthony Moran, male    DOB: Jun 15, 1950, 64 y.o.   MRN: [id number]  HPI: [name] is 64 year old male who returns for follow up for chronic pain and medication refill. He says his pain is located in hislower back mainly left side. He rates his pain 5. His current exercise regime is walking and performing stretching exercises.  On 07/05/2014 Dr. Arnoldo Morale Performed: L3-4 laminectomy with posterior lumbar interbody fusion with interbody prosthesis posterior lateral arthrodesis and posterior segmental instrumentation and exploration of previous fusion.     Review of Systems  Constitutional: Positive for fever, chills, diaphoresis, appetite change and unexpected weight change.  Respiratory: Positive for apnea, shortness of breath and wheezing.   Gastrointestinal: Positive for nausea and constipation.  Endocrine:       High BS  Musculoskeletal:       Spasms  Skin: Positive for rash.  Neurological: Positive for dizziness, tremors, weakness and numbness.       Tingling  Psychiatric/Behavioral: Positive for confusion and dysphoric mood. The patient is nervous/anxious.   All other systems reviewed and are negative.      Objective:   Physical Exam        Assessment & Plan:   Pain Inventory Average Pain 6 Pain Right Now 6 My pain is sharp, burning, dull, stabbing, tingling and aching  In the last 24 hours, has pain interfered with the following? General activity 8 Relation with others 9 Enjoyment of life 10 What TIME of day is your pain at its worst? morning Sleep (in general) Poor  Pain is worse with: walking, bending, sitting, inactivity and standing Pain improves with: rest and heat/ice Relief from Meds: 4  Mobility walk without assistance ability to climb steps?  yes transfers alone  Function disabled: date disabled NA I need assistance with the following:  household duties and  shopping  Neuro/Psych weakness numbness tremor tingling spasms dizziness confusion depression anxiety loss of taste or smell  Prior Studies Any changes since last visit?  no  Physicians involved in your care Any changes since last visit?  no   Family History  Problem Relation Age of Onset  . Breast cancer Maternal Aunt   . Prostate cancer Father   . Hypertension Sister    Social History   Social History  . Marital Status: Married    Spouse Name: N/A  . Number of Children: 3  . Years of Education: N/A   Occupational History  .     Social History Main Topics  . Smoking status: Never Smoker   . Smokeless tobacco: Never Used  . Alcohol Use: No     Comment: rare  . Drug Use: No  . Sexual Activity: Not Asked   Other Topics Concern  . None   Social History Narrative   Past Surgical History  Procedure Laterality Date  . Lumbar fusion      L 4-5, L5 si   Past Medical History  Diagnosis Date  . Hypertension   . Diabetes mellitus   . Anxiety   . Depression   . Arthritis   . Asthma   . Cervical spondylosis without myelopathy   . Spinal stenosis in cervical region   . Lumbar post-laminectomy syndrome   . Lumbosacral neuritis   . Mononeuritis multiplex    BP 140/73 mmHg  Pulse 74  Resp 14  SpO2 100%  Opioid Risk Score:   Fall Risk Score:  `1  Depression  screen PHQ 2/9  Depression screen [facility] 2/9 01/03/2015 08/26/2014  Decreased Interest 0 3  Down, Depressed, Hopeless 0 0  PHQ - 2 Score 0 3  Altered sleeping - 3  Tired, decreased energy - 1  Change in appetite - 3  Feeling bad or failure about yourself  - 0  Trouble concentrating - 3  Moving slowly or fidgety/restless - 1  Suicidal thoughts - 0  PHQ-9 Score - 14

## 2015-05-09 NOTE — Progress Notes (Signed)
Subjective:    Patient ID: Anthony Moran, male    DOB: Dec 18, 1950, 64 y.o.   MRN: [id number]  HPI: [name] is 64 year old male who returns for follow up for chronic pain and medication refill. He says his pain is located in his lower back mainly left side and lower extremities. He rates his pain 6. His current exercise regime is walking and performing stretching exercises. He's walking the mall 3-4 days a week with his wife. Also stated he had another brother to pass away last week, emotional support given.  On 07/05/2014 Dr. Arnoldo Morale Performed: L3-4 laminectomy with posterior lumbar interbody fusion with interbody prosthesis posterior lateral arthrodesis and posterior segmental instrumentation and exploration of previous fusion.  Pain Inventory Average Pain 6 Pain Right Now 6 My pain is sharp, burning, dull, stabbing, tingling and aching  In the last 24 hours, has pain interfered with the following? General activity 8 Relation with others 9 Enjoyment of life 10 What TIME of day is your pain at its worst? morning Sleep (in general) Poor  Pain is worse with: walking, bending, sitting, inactivity and standing Pain improves with: rest and heat/ice Relief from Meds: 4  Mobility walk without assistance ability to climb steps?  yes transfers alone Do you have any goals in this area?  yes  Function disabled: date disabled na I need assistance with the following:  household duties and shopping Do you have any goals in this area?  yes  Neuro/Psych weakness numbness tremor tingling spasms dizziness confusion depression anxiety loss of taste or smell  Prior Studies Any changes since last visit?  no  Physicians involved in your care Any changes since last visit?  no Primary care Lucianne Lei Neurosurgeon Dr. Arnoldo Morale   Family History  Problem Relation Age of Onset  . Breast cancer Maternal Aunt   . Prostate cancer Father   . Hypertension Sister     Social History   Social History  . Marital Status: Married    Spouse Name: N/A  . Number of Children: 3  . Years of Education: N/A   Occupational History  .     Social History Main Topics  . Smoking status: Never Smoker   . Smokeless tobacco: Never Used  . Alcohol Use: No     Comment: rare  . Drug Use: No  . Sexual Activity: Not Asked   Other Topics Concern  . None   Social History Narrative   Past Surgical History  Procedure Laterality Date  . Lumbar fusion      L 4-5, L5 si   Past Medical History  Diagnosis Date  . Hypertension   . Diabetes mellitus   . Anxiety   . Depression   . Arthritis   . Asthma   . Cervical spondylosis without myelopathy   . Spinal stenosis in cervical region   . Lumbar post-laminectomy syndrome   . Lumbosacral neuritis   . Mononeuritis multiplex    BP 140/73 mmHg  Pulse 74  Resp 14  SpO2 100%  Opioid Risk Score:   Fall Risk Score:  `1  Depression screen PHQ 2/9  Depression screen [facility] 2/9 01/03/2015 08/26/2014  Decreased Interest 0 3  Down, Depressed, Hopeless 0 0  PHQ - 2 Score 0 3  Altered sleeping - 3  Tired, decreased energy - 1  Change in appetite - 3  Feeling bad or failure about yourself  - 0  Trouble concentrating - 3  Moving slowly or  fidgety/restless - 1  Suicidal thoughts - 0  PHQ-9 Score - 14    Review of Systems  Constitutional: Positive for fever, chills, diaphoresis, appetite change and unexpected weight change.  Respiratory: Positive for apnea and wheezing.   Gastrointestinal: Positive for nausea and constipation.  Endocrine:       High blood sugar  Skin: Positive for rash.  All other systems reviewed and are negative.      Objective:   Physical Exam  Constitutional: He is oriented to person, place, and time. He appears well-developed and well-nourished.  HENT:  Head: Normocephalic and atraumatic.  Neck: Normal range of motion. Neck supple.  Cardiovascular: Normal rate and regular rhythm.    Pulmonary/Chest: Effort normal and breath sounds normal.  Musculoskeletal:  Normal Muscle Bulk and Muscle Testing Reveals: Upper Extremities: Full ROM and Muscle Strength 5/5 Thoracic Paraspinal Tenderness: T-10- T-12 Lumbar Paraspinal Tenderness: L-3- L-5 Lower Extremities: Full ROM and Muscle Strength 5/5 Right Lower Extremity Flexion Produces Pain into Right Hip and Lower Back Arises from chair slowly Narrow Based gait  Neurological: He is alert and oriented to person, place, and time.  Skin: Skin is warm and dry.  Psychiatric: He has a normal mood and affect.  Nursing note and vitals reviewed.         Assessment & Plan:  1. Lumbar post lami syndrome:  Refilled: Oxycodone increased to 10 mg/325 mg one tablet 3 times a day #90 and Opana 30 mg one tablet every 12 hours #60.  2. Cervical stenosis: Continue exercise and ice therapy and current medication regime.  3. Diabetes with mononeuropathy multiplex: Continue with Cymbalta and Lyrica.   20 minutes of face to face patient care time was spent during this visit. All questions were encouraged and answered.   F/U in 1 month.

## 2015-05-26 ENCOUNTER — Other Ambulatory Visit: Payer: Self-pay | Admitting: Physical Medicine & Rehabilitation

## 2015-06-02 ENCOUNTER — Encounter: Payer: Self-pay | Admitting: Registered Nurse

## 2015-06-02 ENCOUNTER — Encounter: Payer: Medicaid Other | Attending: Physical Medicine & Rehabilitation | Admitting: Registered Nurse

## 2015-06-02 VITALS — BP 147/90 | HR 73 | Resp 16

## 2015-06-02 DIAGNOSIS — M545 Low back pain: Secondary | ICD-10-CM | POA: Insufficient documentation

## 2015-06-02 DIAGNOSIS — G894 Chronic pain syndrome: Secondary | ICD-10-CM | POA: Diagnosis not present

## 2015-06-02 DIAGNOSIS — M961 Postlaminectomy syndrome, not elsewhere classified: Secondary | ICD-10-CM | POA: Insufficient documentation

## 2015-06-02 DIAGNOSIS — M4316 Spondylolisthesis, lumbar region: Secondary | ICD-10-CM

## 2015-06-02 DIAGNOSIS — Z5181 Encounter for therapeutic drug level monitoring: Secondary | ICD-10-CM | POA: Diagnosis not present

## 2015-06-02 DIAGNOSIS — Z79899 Other long term (current) drug therapy: Secondary | ICD-10-CM | POA: Insufficient documentation

## 2015-06-02 MED ORDER — OXYCODONE-ACETAMINOPHEN 10-325 MG PO TABS: 1.0000 | ORAL_TABLET | Freq: Three times a day (TID) | ORAL | Status: DC | PRN

## 2015-06-02 MED ORDER — OPANA ER 30 MG PO T12A: 1.0000 | EXTENDED_RELEASE_TABLET | Freq: Two times a day (BID) | ORAL | Status: DC

## 2015-06-02 NOTE — Progress Notes (Signed)
Subjective:    Patient ID: Anthony Moran, male    DOB: 16-Jul-1950, 64 y.o.   MRN: [id number]  HPI:  Anthony Moran is 64 year old male who returns for follow up for chronic pain and medication refill. He says his pain is located in his lower back. He rates his pain 4. His current exercise regime is walking and performing stretching exercises.  On 07/05/2014 Dr. Arnoldo Morale Performed: L3-4 laminectomy with posterior lumbar interbody fusion with interbody prosthesis posterior lateral arthrodesis and posterior segmental instrumentation and exploration of previous fusion.   Pain Inventory Average Pain 4 Pain Right Now 4 My pain is sharp, stabbing and tingling  In the last 24 hours, has pain interfered with the following? General activity 6 Relation with others 4 Enjoyment of life 10 What TIME of day is your pain at its worst? morning Sleep (in general) Poor  Pain is worse with: walking, bending, sitting, inactivity and standing Pain improves with: rest, heat/ice and medication Relief from Meds: 4  Mobility walk without assistance ability to climb steps?  no do you drive?  no  Function disabled: date disabled . I need assistance with the following:  meal prep, household duties and shopping  Neuro/Psych weakness numbness tingling spasms dizziness depression anxiety loss of taste or smell  Prior Studies Any changes since last visit?  no  Physicians involved in your care Any changes since last visit?  no   Family History  Problem Relation Age of Onset  . Breast cancer Maternal Aunt   . Prostate cancer Father   . Hypertension Sister    Social History   Social History  . Marital Status: Married    Spouse Name: N/A  . Number of Children: 3  . Years of Education: N/A   Occupational History  .     Social History Main Topics  . Smoking status: Never Smoker   . Smokeless tobacco: Never Used  . Alcohol Use: No     Comment: rare  . Drug Use: No  . Sexual  Activity: Not Asked   Other Topics Concern  . None   Social History Narrative   Past Surgical History  Procedure Laterality Date  . Lumbar fusion      L 4-5, L5 si   Past Medical History  Diagnosis Date  . Hypertension   . Diabetes mellitus   . Anxiety   . Depression   . Arthritis   . Asthma   . Cervical spondylosis without myelopathy   . Spinal stenosis in cervical region   . Lumbar post-laminectomy syndrome   . Lumbosacral neuritis   . Mononeuritis multiplex    BP 147/90 mmHg  Pulse 73  Resp 16  SpO2 98%  Opioid Risk Score:   Fall Risk Score:  `1  Depression screen PHQ 2/9  Depression screen [facility] 2/9 01/03/2015 08/26/2014  Decreased Interest 0 3  Down, Depressed, Hopeless 0 0  PHQ - 2 Score 0 3  Altered sleeping - 3  Tired, decreased energy - 1  Change in appetite - 3  Feeling bad or failure about yourself  - 0  Trouble concentrating - 3  Moving slowly or fidgety/restless - 1  Suicidal thoughts - 0  PHQ-9 Score - 14     Review of Systems  Constitutional: Positive for diaphoresis, appetite change and unexpected weight change.  Respiratory: Positive for apnea and wheezing.   Cardiovascular: Positive for leg swelling.  Gastrointestinal: Positive for nausea and constipation.  Endocrine:  High blood sugar  Skin: Positive for rash.  All other systems reviewed and are negative.      Objective:   Physical Exam  Constitutional: He is oriented to person, place, and time. He appears well-developed and well-nourished.  HENT:  Head: Normocephalic and atraumatic.  Neck: Normal range of motion. Neck supple.  Cardiovascular: Normal rate and regular rhythm.   Pulmonary/Chest: Effort normal and breath sounds normal.  Musculoskeletal:  Normal Muscle Bulk and Muscle Testing Reveals: Upper Extremities: Full ROM and Muscle Strength 5/5 Back without spinal or paraspinal tenderness Lower Extremities: Full ROM and Muscle Strength 5/5 Arises from chair with  ease Narrow Based Gait   Neurological: He is alert and oriented to person, place, and time.  Skin: Skin is warm and dry.  Psychiatric: He has a normal mood and affect.  Nursing note and vitals reviewed.         Assessment & Plan:  1. Lumbar post lami syndrome:  Refilled: Oxycodone 10 mg/325 mg one tablet 3 times a day #90 and Opana 30 mg one tablet every 12 hours #60.  2. Cervical stenosis: Continue exercise and ice therapy and current medication regime.  3. Diabetes with mononeuropathy multiplex: Continue with Cymbalta and Lyrica.   20 minutes of face to face patient care time was spent during this visit. All questions were encouraged and answered.   F/U in 1 month.

## 2015-07-06 ENCOUNTER — Encounter: Payer: Medicaid Other | Attending: Physical Medicine & Rehabilitation | Admitting: Registered Nurse

## 2015-07-06 ENCOUNTER — Encounter: Payer: Self-pay | Admitting: Registered Nurse

## 2015-07-06 VITALS — BP 124/76 | HR 79 | Resp 14

## 2015-07-06 DIAGNOSIS — Z5181 Encounter for therapeutic drug level monitoring: Secondary | ICD-10-CM | POA: Diagnosis not present

## 2015-07-06 DIAGNOSIS — G609 Hereditary and idiopathic neuropathy, unspecified: Secondary | ICD-10-CM

## 2015-07-06 DIAGNOSIS — Z79899 Other long term (current) drug therapy: Secondary | ICD-10-CM | POA: Diagnosis not present

## 2015-07-06 DIAGNOSIS — M5416 Radiculopathy, lumbar region: Secondary | ICD-10-CM

## 2015-07-06 DIAGNOSIS — M25561 Pain in right knee: Secondary | ICD-10-CM

## 2015-07-06 DIAGNOSIS — M4316 Spondylolisthesis, lumbar region: Secondary | ICD-10-CM | POA: Diagnosis not present

## 2015-07-06 DIAGNOSIS — G894 Chronic pain syndrome: Secondary | ICD-10-CM | POA: Diagnosis not present

## 2015-07-06 DIAGNOSIS — M545 Low back pain: Secondary | ICD-10-CM | POA: Diagnosis present

## 2015-07-06 DIAGNOSIS — M5417 Radiculopathy, lumbosacral region: Secondary | ICD-10-CM

## 2015-07-06 DIAGNOSIS — M961 Postlaminectomy syndrome, not elsewhere classified: Secondary | ICD-10-CM

## 2015-07-06 MED ORDER — OPANA ER 30 MG PO T12A: 1.0000 | EXTENDED_RELEASE_TABLET | Freq: Two times a day (BID) | ORAL | Status: DC

## 2015-07-06 MED ORDER — OXYCODONE-ACETAMINOPHEN 10-325 MG PO TABS: 1.0000 | ORAL_TABLET | Freq: Three times a day (TID) | ORAL | Status: DC | PRN

## 2015-07-06 NOTE — Progress Notes (Signed)
Subjective:    Patient ID: Anthony Moran, male    DOB: December 13, 1950, 65 y.o.   MRN: [id number]  HPI: Mr. Chima Espericueta is 65 year old male who returns for follow up for chronic pain and medication refill. He says his pain is located in his lower back and right knee. He rates his pain 6. His current exercise regime is walking and performing stretching exercises.   On 07/05/2014 Dr. Arnoldo Morale Performed: L3-4 laminectomy with posterior lumbar interbody fusion with interbody prosthesis posterior lateral arthrodesis and posterior segmental instrumentation and exploration of previous fusion.  Pain Inventory Average Pain 7 Pain Right Now 6 My pain is sharp, burning, dull, stabbing and aching  In the last 24 hours, has pain interfered with the following? General activity 9 Relation with others 8 Enjoyment of life 10 What TIME of day is your pain at its worst? morning Sleep (in general) Poor  Pain is worse with: walking, bending, sitting, inactivity and standing Pain improves with: rest and medication Relief from Meds: 5  Mobility walk without assistance ability to climb steps?  no do you drive?  no transfers alone Do you have any goals in this area?  yes  Function disabled: date disabled . I need assistance with the following:  dressing, meal prep, household duties and shopping  Neuro/Psych weakness numbness tremor tingling trouble walking spasms dizziness confusion anxiety loss of taste or smell  Prior Studies Any changes since last visit?  no  Physicians involved in your care Any changes since last visit?  no   Family History  Problem Relation Age of Onset  . Breast cancer Maternal Aunt   . Prostate cancer Father   . Hypertension Sister    Social History   Social History  . Marital Status: Married    Spouse Name: N/A  . Number of Children: 3  . Years of Education: N/A   Occupational History  .     Social History Main Topics  . Smoking status: Never  Smoker   . Smokeless tobacco: Never Used  . Alcohol Use: No     Comment: rare  . Drug Use: No  . Sexual Activity: Not Asked   Other Topics Concern  . None   Social History Narrative   Past Surgical History  Procedure Laterality Date  . Lumbar fusion      L 4-5, L5 si   Past Medical History  Diagnosis Date  . Hypertension   . Diabetes mellitus   . Anxiety   . Depression   . Arthritis   . Asthma   . Cervical spondylosis without myelopathy   . Spinal stenosis in cervical region   . Lumbar post-laminectomy syndrome   . Lumbosacral neuritis   . Mononeuritis multiplex    BP 124/76 mmHg  Pulse 79  Resp 14  SpO2 96%  Opioid Risk Score:   Fall Risk Score:  `1  Depression screen PHQ 2/9  Depression screen [facility] 2/9 01/03/2015 08/26/2014  Decreased Interest 0 3  Down, Depressed, Hopeless 0 0  PHQ - 2 Score 0 3  Altered sleeping - 3  Tired, decreased energy - 1  Change in appetite - 3  Feeling bad or failure about yourself  - 0  Trouble concentrating - 3  Moving slowly or fidgety/restless - 1  Suicidal thoughts - 0  PHQ-9 Score - 14     Review of Systems  Constitutional: Positive for diaphoresis, appetite change and unexpected weight change.  Respiratory: Positive for apnea  and wheezing.   Cardiovascular: Positive for leg swelling.  Gastrointestinal: Positive for nausea, vomiting, abdominal pain and constipation.  Endocrine:       High blood sugar  All other systems reviewed and are negative.      Objective:   Physical Exam  Constitutional: He is oriented to person, place, and time. He appears well-developed and well-nourished.  HENT:  Head: Normocephalic and atraumatic.  Neck: Normal range of motion. Neck supple.  Cardiovascular: Normal rate and regular rhythm.   Pulmonary/Chest: Effort normal and breath sounds normal.  Musculoskeletal:  Normal Muscle Bulk and Muscle Testing Reveals: Upper Extremities: Full ROM and Muscle Strength 5/5 Back without spinal  or paraspinal tenderness Lower Extremities: Full ROM and Muscle Strength 5/5 Right Lower Extremity Flexion : Produces Pain into Patella/ Swelling Noted Left Lower Extremity Flexion Produces Pain into Lumbar Arises from chair with ease Narrow based Gait  Neurological: He is alert and oriented to person, place, and time.  Skin: Skin is warm and dry.  Psychiatric: He has a normal mood and affect.  Nursing note and vitals reviewed.         Assessment & Plan:  1. Lumbar post lami syndrome:  Refilled: Oxycodone 10 mg/325 mg one tablet 3 times a day #90 and Opana 30 mg one tablet every 12 hours #60.  2. Cervical stenosis: Continue exercise and ice therapy and current medication regime.  3. Diabetes with mononeuropathy multiplex: Continue with Cymbalta and Lyrica.   20 minutes of face to face patient care time was spent during this visit. All questions were encouraged and answered.   F/U in 1 month.

## 2015-07-12 LAB — TOXASSURE SELECT,+ANTIDEPR,UR: PDF: 0

## 2015-07-12 NOTE — Progress Notes (Signed)
Urine drug screen for this encounter is consistent for prescribed medication 

## 2015-07-26 ENCOUNTER — Other Ambulatory Visit: Payer: Self-pay | Admitting: Registered Nurse

## 2015-07-27 NOTE — Telephone Encounter (Signed)
Please advise on refill? I do not see proper documentation to continue this med. Thanks!

## 2015-07-28 ENCOUNTER — Other Ambulatory Visit: Payer: Self-pay | Admitting: *Deleted

## 2015-08-05 ENCOUNTER — Encounter: Payer: Self-pay | Admitting: Registered Nurse

## 2015-08-05 ENCOUNTER — Encounter: Payer: Medicaid Other | Attending: Physical Medicine & Rehabilitation | Admitting: Registered Nurse

## 2015-08-05 VITALS — BP 124/77 | HR 88 | Resp 14

## 2015-08-05 DIAGNOSIS — Z5181 Encounter for therapeutic drug level monitoring: Secondary | ICD-10-CM | POA: Diagnosis present

## 2015-08-05 DIAGNOSIS — M545 Low back pain: Secondary | ICD-10-CM | POA: Insufficient documentation

## 2015-08-05 DIAGNOSIS — M5416 Radiculopathy, lumbar region: Secondary | ICD-10-CM

## 2015-08-05 DIAGNOSIS — M25561 Pain in right knee: Secondary | ICD-10-CM

## 2015-08-05 DIAGNOSIS — G894 Chronic pain syndrome: Secondary | ICD-10-CM

## 2015-08-05 DIAGNOSIS — M961 Postlaminectomy syndrome, not elsewhere classified: Secondary | ICD-10-CM | POA: Diagnosis not present

## 2015-08-05 DIAGNOSIS — Z79899 Other long term (current) drug therapy: Secondary | ICD-10-CM | POA: Diagnosis present

## 2015-08-05 DIAGNOSIS — M5417 Radiculopathy, lumbosacral region: Secondary | ICD-10-CM | POA: Diagnosis not present

## 2015-08-05 DIAGNOSIS — M4316 Spondylolisthesis, lumbar region: Secondary | ICD-10-CM | POA: Diagnosis not present

## 2015-08-05 MED ORDER — OPANA ER 30 MG PO T12A: 1.0000 | EXTENDED_RELEASE_TABLET | Freq: Two times a day (BID) | ORAL | Status: DC

## 2015-08-05 MED ORDER — OXYCODONE-ACETAMINOPHEN 10-325 MG PO TABS: 1.0000 | ORAL_TABLET | Freq: Three times a day (TID) | ORAL | Status: DC | PRN

## 2015-08-05 NOTE — Progress Notes (Signed)
Subjective:    Patient ID: Anthony Moran, male    DOB: 1951-03-30, 65 y.o.   MRN: [id number]  HPI: [name] is 65 year old male who returns for follow up for chronic pain and medication refill. He states his pain is located in his lower back and right knee. He rates his pain 5. His current exercise regime is walking and performing stretching exercises.   On 07/05/2014 Dr. Arnoldo Morale Performed: L3-4 laminectomy with posterior lumbar interbody fusion with interbody prosthesis posterior lateral arthrodesis and posterior segmental instrumentation and exploration of previous fusion.  Pain Inventory Average Pain 5 Pain Right Now 5 My pain is sharp, burning, stabbing and aching  In the last 24 hours, has pain interfered with the following? General activity 9 Relation with others 10 Enjoyment of life 10 What TIME of day is your pain at its worst? morning Sleep (in general) Poor  Pain is worse with: walking, bending, sitting, inactivity and standing Pain improves with: rest, heat/ice and medication Relief from Meds: 4  Mobility walk without assistance ability to climb steps?  no do you drive?  no transfers alone Do you have any goals in this area?  yes  Function disabled: date disabled NA Do you have any goals in this area?  yes  Neuro/Psych weakness numbness trouble walking spasms dizziness confusion depression anxiety  Prior Studies Any changes since last visit?  no  Physicians involved in your care Primary care Lucianne Lei   Family History  Problem Relation Age of Onset  . Breast cancer Maternal Aunt   . Prostate cancer Father   . Hypertension Sister    Social History   Social History  . Marital Status: Married    Spouse Name: N/A  . Number of Children: 3  . Years of Education: N/A   Occupational History  .     Social History Main Topics  . Smoking status: Never Smoker   . Smokeless tobacco: Never Used  . Alcohol Use: No     Comment:  rare  . Drug Use: No  . Sexual Activity: Not Asked   Other Topics Concern  . None   Social History Narrative   Past Surgical History  Procedure Laterality Date  . Lumbar fusion      L 4-5, L5 si   Past Medical History  Diagnosis Date  . Hypertension   . Diabetes mellitus   . Anxiety   . Depression   . Arthritis   . Asthma   . Cervical spondylosis without myelopathy   . Spinal stenosis in cervical region   . Lumbar post-laminectomy syndrome   . Lumbosacral neuritis   . Mononeuritis multiplex    BP 124/77 mmHg  Pulse 88  Resp 14  SpO2 96%  Opioid Risk Score:   Fall Risk Score:  `1  Depression screen PHQ 2/9  Depression screen [facility] 2/9 01/03/2015 08/26/2014  Decreased Interest 0 3  Down, Depressed, Hopeless 0 0  PHQ - 2 Score 0 3  Altered sleeping - 3  Tired, decreased energy - 1  Change in appetite - 3  Feeling bad or failure about yourself  - 0  Trouble concentrating - 3  Moving slowly or fidgety/restless - 1  Suicidal thoughts - 0  PHQ-9 Score - 14    Review of Systems  Constitutional: Positive for diaphoresis, appetite change and unexpected weight change.  Respiratory: Positive for apnea and wheezing.   Cardiovascular:       Limb swelling  Gastrointestinal: Positive for nausea, vomiting, abdominal pain and constipation.  Endocrine:       High blood sugar  Musculoskeletal: Positive for gait problem.  Skin: Positive for rash.  Neurological: Positive for dizziness, weakness and numbness.       Spasms   Psychiatric/Behavioral: Positive for confusion and dysphoric mood. The patient is nervous/anxious.   All other systems reviewed and are negative.      Objective:   Physical Exam  Constitutional: He is oriented to person, place, and time. He appears well-developed and well-nourished.  HENT:  Head: Normocephalic and atraumatic.  Neck: Normal range of motion. Neck supple.  Cardiovascular: Normal rate and regular rhythm.   Pulmonary/Chest: Effort  normal and breath sounds normal.  Musculoskeletal:  Normal Muscle Bulk and Muscle Testing Reveals: Upper Extremities: Full ROM and Muscle Strength 5/5 Lumbar Paraspinal Tenderness: L-3- L-5 Lower Extremities: Full ROM and Muscle Strength 5/5 Arises from chair with ease Narrow Based Gait  Neurological: He is alert and oriented to person, place, and time.  Skin: Skin is warm and dry.  Psychiatric: He has a normal mood and affect.  Nursing note and vitals reviewed.         Assessment & Plan:  1. Lumbar post lami syndrome:  Refilled: Oxycodone 10 mg/325 mg one tablet every 8 hours as needed for pain #90 and Opana 30 mg one tablet every 12 hours #60.  2. Cervical stenosis: Continue exercise and ice therapy and current medication regime.  3. Diabetes with mononeuropathy multiplex: Continue with Cymbalta and Lyrica.   20 minutes of face to face patient care time was spent during this visit. All questions were encouraged and answered.   F/U in 1 month.

## 2015-09-05 ENCOUNTER — Encounter: Payer: Medicaid Other | Admitting: Registered Nurse

## 2015-09-11 ENCOUNTER — Encounter (HOSPITAL_COMMUNITY): Payer: Self-pay | Admitting: *Deleted

## 2015-09-11 ENCOUNTER — Observation Stay (HOSPITAL_COMMUNITY)
Admission: EM | Admit: 2015-09-11 | Discharge: 2015-09-13 | Disposition: A | Discharge status: Home / Self Care | Payer: Medicaid Other | Attending: Internal Medicine | Admitting: Internal Medicine

## 2015-09-11 ENCOUNTER — Emergency Department (HOSPITAL_COMMUNITY): Payer: Self-pay

## 2015-09-11 DIAGNOSIS — M1990 Unspecified osteoarthritis, unspecified site: Secondary | ICD-10-CM | POA: Insufficient documentation

## 2015-09-11 DIAGNOSIS — I10 Essential (primary) hypertension: Secondary | ICD-10-CM | POA: Diagnosis present

## 2015-09-11 DIAGNOSIS — I152 Hypertension secondary to endocrine disorders: Secondary | ICD-10-CM | POA: Diagnosis present

## 2015-09-11 DIAGNOSIS — N400 Enlarged prostate without lower urinary tract symptoms: Secondary | ICD-10-CM | POA: Diagnosis present

## 2015-09-11 DIAGNOSIS — I129 Hypertensive chronic kidney disease with stage 1 through stage 4 chronic kidney disease, or unspecified chronic kidney disease: Secondary | ICD-10-CM | POA: Insufficient documentation

## 2015-09-11 DIAGNOSIS — Z8249 Family history of ischemic heart disease and other diseases of the circulatory system: Secondary | ICD-10-CM | POA: Insufficient documentation

## 2015-09-11 DIAGNOSIS — Z7984 Long term (current) use of oral hypoglycemic drugs: Secondary | ICD-10-CM | POA: Insufficient documentation

## 2015-09-11 DIAGNOSIS — R001 Bradycardia, unspecified: Secondary | ICD-10-CM | POA: Diagnosis present

## 2015-09-11 DIAGNOSIS — I951 Orthostatic hypotension: Principal | ICD-10-CM | POA: Diagnosis present

## 2015-09-11 DIAGNOSIS — Z981 Arthrodesis status: Secondary | ICD-10-CM | POA: Insufficient documentation

## 2015-09-11 DIAGNOSIS — F419 Anxiety disorder, unspecified: Secondary | ICD-10-CM | POA: Insufficient documentation

## 2015-09-11 DIAGNOSIS — M549 Dorsalgia, unspecified: Secondary | ICD-10-CM | POA: Insufficient documentation

## 2015-09-11 DIAGNOSIS — Z79899 Other long term (current) drug therapy: Secondary | ICD-10-CM | POA: Insufficient documentation

## 2015-09-11 DIAGNOSIS — F329 Major depressive disorder, single episode, unspecified: Secondary | ICD-10-CM | POA: Insufficient documentation

## 2015-09-11 DIAGNOSIS — R42 Dizziness and giddiness: Secondary | ICD-10-CM | POA: Insufficient documentation

## 2015-09-11 DIAGNOSIS — G8929 Other chronic pain: Secondary | ICD-10-CM | POA: Diagnosis present

## 2015-09-11 DIAGNOSIS — N179 Acute kidney failure, unspecified: Secondary | ICD-10-CM | POA: Insufficient documentation

## 2015-09-11 DIAGNOSIS — E1159 Type 2 diabetes mellitus with other circulatory complications: Secondary | ICD-10-CM | POA: Diagnosis present

## 2015-09-11 DIAGNOSIS — N183 Chronic kidney disease, stage 3 (moderate): Secondary | ICD-10-CM | POA: Insufficient documentation

## 2015-09-11 DIAGNOSIS — E1165 Type 2 diabetes mellitus with hyperglycemia: Secondary | ICD-10-CM | POA: Insufficient documentation

## 2015-09-11 DIAGNOSIS — Z8042 Family history of malignant neoplasm of prostate: Secondary | ICD-10-CM | POA: Insufficient documentation

## 2015-09-11 DIAGNOSIS — J45909 Unspecified asthma, uncomplicated: Secondary | ICD-10-CM | POA: Insufficient documentation

## 2015-09-11 DIAGNOSIS — E785 Hyperlipidemia, unspecified: Secondary | ICD-10-CM | POA: Insufficient documentation

## 2015-09-11 DIAGNOSIS — E1121 Type 2 diabetes mellitus with diabetic nephropathy: Secondary | ICD-10-CM | POA: Diagnosis present

## 2015-09-11 DIAGNOSIS — Z803 Family history of malignant neoplasm of breast: Secondary | ICD-10-CM | POA: Insufficient documentation

## 2015-09-11 LAB — BASIC METABOLIC PANEL
ANION GAP: 11 (ref 5–15)
BUN: 29 mg/dL — ABNORMAL HIGH (ref 6–20)
CALCIUM: 9.7 mg/dL (ref 8.9–10.3)
CO2: 26 mmol/L (ref 22–32)
CREATININE: 2.24 mg/dL — AB (ref 0.61–1.24)
Chloride: 103 mmol/L (ref 101–111)
GFR, EST AFRICAN AMERICAN: 34 mL/min — AB (ref >60)
GFR, EST NON AFRICAN AMERICAN: 29 mL/min — AB (ref >60)
GLUCOSE: 119 mg/dL — AB (ref 65–99)
Potassium: 4.2 mmol/L (ref 3.5–5.1)
Sodium: 140 mmol/L (ref 135–145)

## 2015-09-11 LAB — URINE MICROSCOPIC-ADD ON: BACTERIA UA: NONE SEEN

## 2015-09-11 LAB — URINALYSIS, ROUTINE W REFLEX MICROSCOPIC
BILIRUBIN URINE: NEGATIVE
Glucose, UA: NEGATIVE mg/dL
HGB URINE DIPSTICK: NEGATIVE
KETONES UR: NEGATIVE mg/dL
Leukocytes, UA: NEGATIVE
NITRITE: NEGATIVE
PROTEIN: 30 mg/dL — AB
Specific Gravity, Urine: 1.022 (ref 1.005–1.030)
pH: 5.5 (ref 5.0–8.0)

## 2015-09-11 LAB — CBC
HCT: 36.1 % — ABNORMAL LOW (ref 39.0–52.0)
HEMOGLOBIN: 11.9 g/dL — AB (ref 13.0–17.0)
MCH: 25.2 pg — AB (ref 26.0–34.0)
MCHC: 33 g/dL (ref 30.0–36.0)
MCV: 76.5 fL — ABNORMAL LOW (ref 78.0–100.0)
PLATELETS: 204 10*3/uL (ref 150–400)
RBC: 4.72 MIL/uL (ref 4.22–5.81)
RDW: 13.6 % (ref 11.5–15.5)
WBC: 7.1 10*3/uL (ref 4.0–10.5)

## 2015-09-11 LAB — CBG MONITORING, ED
GLUCOSE-CAPILLARY: 114 mg/dL — AB (ref 65–99)
GLUCOSE-CAPILLARY: 113 mg/dL — AB (ref 65–99)

## 2015-09-11 LAB — I-STAT TROPONIN, ED: Troponin i, poc: 0.01 ng/mL (ref 0.00–0.08)

## 2015-09-11 MED ORDER — SODIUM CHLORIDE 0.9 % IV BOLUS (SEPSIS)
1000.0000 mL | Freq: Once | INTRAVENOUS | Status: AC
  Administered 2015-09-11: 1000 mL via INTRAVENOUS

## 2015-09-11 MED ORDER — SODIUM CHLORIDE 0.9 % IV BOLUS (SEPSIS): 1000.0000 mL | Freq: Once | INTRAVENOUS | Status: DC

## 2015-09-11 NOTE — ED Notes (Signed)
Pt complains of dizziness for the past 2 weeks. Pt states he has had nasal congestion off and on for the past. Pt states the dizziness is worse when standing or bending over. Pt states he has felt more dehydrated lately.

## 2015-09-11 NOTE — ED Notes (Signed)
RN made aware of PT heart rate

## 2015-09-11 NOTE — ED Provider Notes (Signed)
CSN: [id number]     Arrival date & time 09/11/15  1637 History   First MD Initiated Contact with Patient 09/11/15 1946     Chief Complaint  Patient presents with  . Dizziness     (Consider location/radiation/quality/duration/timing/severity/associated sxs/prior Treatment) HPI   Patient is a 65 year old male presenting with dizziness. Patient has dizziness that is associated with standing and sitting. Patient says that every time he moves from lying down to standing he becomes acutely dizzy and almost passes out. Patient said this is has been ongoing for last 2 weeks. Patient started no new medications.  Patient has no fever, shortness of breath, nausea vomiting or diarrhea.  Patient noted mild sinus congestion 2 weeks ago. Since resolved.  Patient has no neurologic symptoms.  Past Medical History  Diagnosis Date  . Hypertension   . Diabetes mellitus   . Anxiety   . Depression   . Arthritis   . Asthma   . Cervical spondylosis without myelopathy   . Spinal stenosis in cervical region   . Lumbar post-laminectomy syndrome   . Lumbosacral neuritis   . Mononeuritis multiplex    Past Surgical History  Procedure Laterality Date  . Lumbar fusion      L 4-5, L5 si   Family History  Problem Relation Age of Onset  . Breast cancer Maternal Aunt   . Prostate cancer Father   . Hypertension Sister    Social History  Substance Use Topics  . Smoking status: Never Smoker   . Smokeless tobacco: Never Used  . Alcohol Use: No     Comment: rare    Review of Systems  Constitutional: Negative for fever and activity change.  Eyes: Negative for discharge.  Respiratory: Negative for cough.   Cardiovascular: Negative for chest pain.  Gastrointestinal: Negative for abdominal pain.  Genitourinary: Negative for dysuria and urgency.  Musculoskeletal: Negative for arthralgias.  Allergic/Immunologic: Negative for immunocompromised state.  Neurological: Positive for dizziness, weakness and  light-headedness. Negative for speech difficulty.  Psychiatric/Behavioral: Negative for behavioral problems.  All other systems reviewed and are negative.     Allergies  Isosorb dinitrate-hydralazine  Home Medications   Prior to Admission medications   Medication Sig Start Date End Date Taking? Authorizing Provider  amLODipine (NORVASC) 10 MG tablet Take 10 mg by mouth daily.     Yes Historical Provider, MD  clobetasol cream (TEMOVATE) [id number] % Apply 1 application topically 2 (two) times daily as needed (rash).    Yes Lucianne Lei, MD  cloNIDine (CATAPRES - DOSED IN MG/24 HR) 0.3 mg/24hr Place 1 patch onto the skin once a week.   Yes Historical Provider, MD  clotrimazole-betamethasone (LOTRISONE) cream Apply 1 application topically 2 (two) times daily as needed.    Yes Lucianne Lei, MD  diazepam (VALIUM) 5 MG tablet Take 1 tablet (5 mg total) by mouth every 6 (six) hours as needed for muscle spasms. 07/06/14  Yes Newman Pies, MD  docusate sodium (COLACE) 100 MG capsule Take 1 capsule (100 mg total) by mouth 2 (two) times daily. 07/06/14  Yes Newman Pies, MD  DULoxetine (CYMBALTA) 60 MG capsule TAKE ONE CAPSULE BY MOUTH EVERY DAY 05/26/15  Yes Charlett Blake, MD  famotidine (PEPCID AC) 10 MG chewable tablet Chew 10 mg by mouth daily as needed for heartburn.   Yes Historical Provider, MD  fexofenadine (ALLEGRA) 180 MG tablet Take 180 mg by mouth daily as needed for allergies.    Yes Historical Provider, MD  Iron-FA-B Cmp-C-Biot-Probiotic (  FUSION PLUS) CAPS Take 1 capsule by mouth daily. 04/03/14  Yes Historical Provider, MD  metFORMIN (GLUCOPHAGE) 850 MG tablet Take 850 mg by mouth 2 (two) times daily with a meal.   Yes Historical Provider, MD  OPANA ER, CRUSH RESISTANT, 30 MG T12A Take 1 tablet by mouth every 12 (twelve) hours. 08/05/15  Yes Bayard Hugger, NP  oxyCODONE-acetaminophen (PERCOCET) 10-325 MG tablet Take 1 tablet by mouth every 8 (eight) hours as needed for pain. 08/05/15   Yes Bayard Hugger, NP  polyethylene glycol (MIRALAX / GLYCOLAX) packet Take 17 g by mouth daily as needed.   Yes Historical Provider, MD  pregabalin (LYRICA) 100 MG capsule Take 1 capsule (100 mg total) by mouth 3 (three) times daily. 04/07/15  Yes Charlett Blake, MD  rosuvastatin (CRESTOR) 10 MG tablet Take 10 mg by mouth daily.   Yes Historical Provider, MD  sitaGLIPtin (JANUVIA) 100 MG tablet Take 100 mg by mouth daily.   Yes Historical Provider, MD  Tamsulosin HCl (FLOMAX) 0.4 MG CAPS Take 0.4 mg by mouth at bedtime.   Yes Historical Provider, MD  valsartan-hydrochlorothiazide (DIOVAN-HCT) 320-25 MG per tablet Take 1 tablet by mouth daily.   Yes Historical Provider, MD  VOLTAREN 1 % GEL Apply 1 application topically 4 (four) times daily as needed. 09/14/14  Yes Historical Provider, MD  VOLTAREN 1 % GEL APPLY 2 GRAMS TOPICALLY 4 TIMES A DAY 07/27/15  Yes Charlett Blake, MD   BP 138/81 mmHg  Pulse 48  Temp(Src) 99 F (37.2 C) (Oral)  Resp 20  SpO2 100% Physical Exam  Constitutional: He is oriented to person, place, and time. He appears well-nourished.  HENT:  Head: Normocephalic.  Mouth/Throat: Oropharynx is clear and moist.  Bilateral TMs normal.  Eyes: Conjunctivae are normal.  Neck: No tracheal deviation present.  Cardiovascular:  Bradycardia.  Pulmonary/Chest: Effort normal. No stridor. No respiratory distress.  Abdominal: Soft. There is no tenderness. There is no guarding.  Musculoskeletal: Normal range of motion. He exhibits no edema.  Neurological: He is oriented to person, place, and time. No cranial nerve deficit.  Skin: Skin is warm and dry. No rash noted. He is not diaphoretic.  Psychiatric: He has a normal mood and affect. His behavior is normal.  Nursing note and vitals reviewed.   ED Course  Procedures (including critical care time) Labs Review Labs Reviewed  BASIC METABOLIC PANEL - Abnormal; Notable for the following:    Glucose, Bld 119 (*)    BUN 29  (*)    Creatinine, Ser 2.24 (*)    GFR calc non Af Amer 29 (*)    GFR calc Af Amer 34 (*)    All other components within normal limits  CBC - Abnormal; Notable for the following:    Hemoglobin 11.9 (*)    HCT 36.1 (*)    MCV 76.5 (*)    MCH 25.2 (*)    All other components within normal limits  URINALYSIS, ROUTINE W REFLEX MICROSCOPIC (NOT AT [facility]) - Abnormal; Notable for the following:    APPearance CLOUDY (*)    Protein, ur 30 (*)    All other components within normal limits  URINE MICROSCOPIC-ADD ON - Abnormal; Notable for the following:    Squamous Epithelial / LPF 0-5 (*)    Casts HYALINE CASTS (*)    All other components within normal limits  CBG MONITORING, ED - Abnormal; Notable for the following:    Glucose-Capillary 114 (*)  All other components within normal limits  CBG MONITORING, ED - Abnormal; Notable for the following:    Glucose-Capillary 113 (*)    All other components within normal limits  I-STAT TROPOININ, ED    Imaging Review Dg Chest 2 View  09/11/2015  CLINICAL DATA:  Subacute onset of dizziness. Nasal congestion. Initial encounter. EXAM: CHEST  2 VIEW COMPARISON:  None. FINDINGS: The lungs are well-aerated and clear. There is no evidence of focal opacification, pleural effusion or pneumothorax. The heart is normal in size; the mediastinal contour is within normal limits. No acute osseous abnormalities are seen. Lumbar spinal fusion hardware is partially imaged. IMPRESSION: No acute cardiopulmonary process seen. Electronically Signed   By: Garald Balding M.D.   On: 09/11/2015 21:55   I have personally reviewed and evaluated these images and lab results as part of my medical decision-making.   EKG Interpretation   Date/Time:  Sunday September 11 2015 19:53:40 EDT Ventricular Rate:  59 PR Interval:  161 QRS Duration: 90 QT Interval:  428 QTC Calculation: 424 R Axis:   -6 Text Interpretation:  Sinus rhythm No significant change since last  tracing  Confirmed by Gerald Leitz ([id number]) on 09/11/2015 8:02:33 PM      MDM   Final diagnoses:  Orthostatic dizziness    Patient is a very pleasant 65 year old gentleman with history of diabetes, hypertension, anxiety, depression.  He is presenting with 2 weeks of positional lightheadedness. Every time he goes from sitting to standing patient develops lightheadedness and feels that he might pass out. Patient is bradycardic on exam. I had hiim stand up his heart rate goes to the 90s and his blood pressure drops. These orthostatic vital signs are documented in the chart.  Patient has new AK I on labs.  Patient is on amlodipine, I do not see any beta blockers listed in his medication.  Will rule out occult infection with chest x-ray urine.  We'll give fluids. However given the severity of patient's orthostatic hypotension patient will require inpatient admission.   Hanford Lust Julio Alm, MD 09/11/15 [phone]

## 2015-09-12 DIAGNOSIS — G8929 Other chronic pain: Secondary | ICD-10-CM | POA: Diagnosis present

## 2015-09-12 DIAGNOSIS — E1121 Type 2 diabetes mellitus with diabetic nephropathy: Secondary | ICD-10-CM | POA: Diagnosis present

## 2015-09-12 DIAGNOSIS — M549 Dorsalgia, unspecified: Secondary | ICD-10-CM

## 2015-09-12 DIAGNOSIS — I951 Orthostatic hypotension: Secondary | ICD-10-CM

## 2015-09-12 DIAGNOSIS — N400 Enlarged prostate without lower urinary tract symptoms: Secondary | ICD-10-CM | POA: Diagnosis present

## 2015-09-12 DIAGNOSIS — I10 Essential (primary) hypertension: Secondary | ICD-10-CM

## 2015-09-12 DIAGNOSIS — R001 Bradycardia, unspecified: Secondary | ICD-10-CM | POA: Diagnosis present

## 2015-09-12 LAB — TSH: TSH: 0.909 u[IU]/mL (ref 0.350–4.500)

## 2015-09-12 LAB — CBC
HCT: 34.3 % — ABNORMAL LOW (ref 39.0–52.0)
HEMOGLOBIN: 11.2 g/dL — AB (ref 13.0–17.0)
MCH: 25.5 pg — AB (ref 26.0–34.0)
MCHC: 32.7 g/dL (ref 30.0–36.0)
MCV: 78.1 fL (ref 78.0–100.0)
Platelets: 183 10*3/uL (ref 150–400)
RBC: 4.39 MIL/uL (ref 4.22–5.81)
RDW: 13.7 % (ref 11.5–15.5)
WBC: 5.6 10*3/uL (ref 4.0–10.5)

## 2015-09-12 LAB — BASIC METABOLIC PANEL
ANION GAP: 8 (ref 5–15)
BUN: 30 mg/dL — ABNORMAL HIGH (ref 6–20)
CALCIUM: 8.9 mg/dL (ref 8.9–10.3)
CHLORIDE: 107 mmol/L (ref 101–111)
CO2: 25 mmol/L (ref 22–32)
CREATININE: 1.97 mg/dL — AB (ref 0.61–1.24)
GFR calc Af Amer: 40 mL/min — ABNORMAL LOW (ref >60)
GFR calc non Af Amer: 34 mL/min — ABNORMAL LOW (ref >60)
GLUCOSE: 116 mg/dL — AB (ref 65–99)
Potassium: 3.9 mmol/L (ref 3.5–5.1)
Sodium: 140 mmol/L (ref 135–145)

## 2015-09-12 LAB — GLUCOSE, CAPILLARY
GLUCOSE-CAPILLARY: 143 mg/dL — AB (ref 65–99)
Glucose-Capillary: 100 mg/dL — ABNORMAL HIGH (ref 65–99)
Glucose-Capillary: 95 mg/dL (ref 65–99)
Glucose-Capillary: 119 mg/dL — ABNORMAL HIGH (ref 65–99)

## 2015-09-12 LAB — CORTISOL: Cortisol, Plasma: 10.1 ug/dL

## 2015-09-12 LAB — TROPONIN I

## 2015-09-12 MED ORDER — CLONIDINE HCL 0.2 MG/24HR TD PTWK
0.2000 mg | MEDICATED_PATCH | TRANSDERMAL | Status: DC
  Administered 2015-09-12: 0.2 mg via TRANSDERMAL
  Filled 2015-09-12: qty 1

## 2015-09-12 MED ORDER — OXYCODONE-ACETAMINOPHEN 5-325 MG PO TABS
1.0000 | ORAL_TABLET | Freq: Three times a day (TID) | ORAL | Status: DC | PRN
  Administered 2015-09-12 (×2): 1 via ORAL
  Filled 2015-09-12 (×2): qty 1

## 2015-09-12 MED ORDER — SODIUM CHLORIDE 0.9 % IV SOLN
INTRAVENOUS | Status: DC
  Administered 2015-09-12 (×2): via INTRAVENOUS

## 2015-09-12 MED ORDER — FAMOTIDINE 10 MG PO TABS
10.0000 mg | ORAL_TABLET | Freq: Every day | ORAL | Status: DC | PRN
  Filled 2015-09-12: qty 1

## 2015-09-12 MED ORDER — TAMSULOSIN HCL 0.4 MG PO CAPS
0.4000 mg | ORAL_CAPSULE | Freq: Every day | ORAL | Status: DC
  Administered 2015-09-12: 0.4 mg via ORAL
  Filled 2015-09-12: qty 1

## 2015-09-12 MED ORDER — CLONIDINE HCL 0.3 MG/24HR TD PTWK
0.3000 mg | MEDICATED_PATCH | TRANSDERMAL | Status: DC
  Administered 2015-09-12: 0.3 mg via TRANSDERMAL
  Filled 2015-09-12: qty 1

## 2015-09-12 MED ORDER — ACETAMINOPHEN 325 MG PO TABS: 650.0000 mg | ORAL_TABLET | Freq: Four times a day (QID) | ORAL | Status: DC | PRN

## 2015-09-12 MED ORDER — MORPHINE SULFATE ER 30 MG PO TBCR
60.0000 mg | EXTENDED_RELEASE_TABLET | Freq: Two times a day (BID) | ORAL | Status: DC
  Administered 2015-09-12 (×2): 60 mg via ORAL
  Filled 2015-09-12 (×2): qty 2

## 2015-09-12 MED ORDER — ROSUVASTATIN CALCIUM 10 MG PO TABS
10.0000 mg | ORAL_TABLET | Freq: Every day | ORAL | Status: DC
  Administered 2015-09-12: 10 mg via ORAL
  Filled 2015-09-12: qty 1

## 2015-09-12 MED ORDER — DOCUSATE SODIUM 100 MG PO CAPS: 100.0000 mg | ORAL_CAPSULE | Freq: Two times a day (BID) | ORAL | Status: DC | PRN

## 2015-09-12 MED ORDER — LORATADINE 10 MG PO TABS
10.0000 mg | ORAL_TABLET | Freq: Every day | ORAL | Status: DC
  Administered 2015-09-12: 10 mg via ORAL
  Filled 2015-09-12: qty 1

## 2015-09-12 MED ORDER — ONDANSETRON HCL 4 MG/2ML IJ SOLN: 4.0000 mg | Freq: Four times a day (QID) | INTRAMUSCULAR | Status: DC | PRN

## 2015-09-12 MED ORDER — ALBUTEROL SULFATE (2.5 MG/3ML) 0.083% IN NEBU: 2.5000 mg | INHALATION_SOLUTION | RESPIRATORY_TRACT | Status: DC | PRN

## 2015-09-12 MED ORDER — CLOBETASOL PROPIONATE 0.05 % EX CREA
1.0000 "application " | TOPICAL_CREAM | Freq: Two times a day (BID) | CUTANEOUS | Status: DC | PRN
  Filled 2015-09-12: qty 15

## 2015-09-12 MED ORDER — AMLODIPINE BESYLATE 5 MG PO TABS
5.0000 mg | ORAL_TABLET | Freq: Every day | ORAL | Status: DC
  Filled 2015-09-12: qty 1

## 2015-09-12 MED ORDER — ACETAMINOPHEN 650 MG RE SUPP: 650.0000 mg | Freq: Four times a day (QID) | RECTAL | Status: DC | PRN

## 2015-09-12 MED ORDER — ONDANSETRON HCL 4 MG PO TABS: 4.0000 mg | ORAL_TABLET | Freq: Four times a day (QID) | ORAL | Status: DC | PRN

## 2015-09-12 MED ORDER — AMLODIPINE BESYLATE 10 MG PO TABS
10.0000 mg | ORAL_TABLET | Freq: Every day | ORAL | Status: DC
  Administered 2015-09-12: 10 mg via ORAL
  Filled 2015-09-12: qty 1

## 2015-09-12 MED ORDER — DULOXETINE HCL 60 MG PO CPEP
60.0000 mg | ORAL_CAPSULE | Freq: Every day | ORAL | Status: DC
  Administered 2015-09-12: 60 mg via ORAL
  Filled 2015-09-12: qty 1

## 2015-09-12 MED ORDER — INSULIN ASPART 100 UNIT/ML ~~LOC~~ SOLN: 0.0000 [IU] | Freq: Three times a day (TID) | SUBCUTANEOUS | Status: DC

## 2015-09-12 MED ORDER — OXYCODONE-ACETAMINOPHEN 10-325 MG PO TABS
1.0000 | ORAL_TABLET | Freq: Three times a day (TID) | ORAL | Status: DC | PRN
Start: 2015-09-12 — End: 2015-09-12

## 2015-09-12 MED ORDER — HEPARIN SODIUM (PORCINE) 5000 UNIT/ML IJ SOLN
5000.0000 [IU] | Freq: Three times a day (TID) | INTRAMUSCULAR | Status: DC
  Administered 2015-09-12 – 2015-09-13 (×4): 5000 [IU] via SUBCUTANEOUS
  Filled 2015-09-12 (×5): qty 1

## 2015-09-12 MED ORDER — OXYCODONE HCL 5 MG PO TABS
5.0000 mg | ORAL_TABLET | Freq: Three times a day (TID) | ORAL | Status: DC | PRN
Start: 2015-09-12 — End: 2015-09-13

## 2015-09-12 MED ORDER — POLYETHYLENE GLYCOL 3350 17 G PO PACK
17.0000 g | PACK | Freq: Every day | ORAL | Status: DC
  Administered 2015-09-12: 17 g via ORAL
  Filled 2015-09-12: qty 1

## 2015-09-12 NOTE — Progress Notes (Addendum)
PATIENT DETAILS Name: Anthony Moran Age: 65 y.o. Sex: male Date of Birth: January 01, 1951 Admit Date: 09/11/2015 Admitting Physician Norval Morton, MD [id number] J, MD  Subjective: No dizziness on standing up today. Feels better than on admission.  Assessment/Plan: Principal Problem: Orthostatic hypotension: Multifactorial etiology-poor oral intake-along with continued use of diuretics and other numerous antihypertensives likely causing orthostatic. Now clinically euvolemic, stop IV fluids, continue to hold HCTZ and valsartan. Place TED hose, decrease amlodipine to 5 mg, cautiously continue with Flomax-as he did have significant symptoms of BPH still a few months ago. Ambulate, if continues to do well without dizziness on standing up-suspect he can be discharged home tomorrow.  Active Problems: Acute on chronic renal failure stage III: ARF likely secondary to prerenal azotemia-from dehydration, HCTZ and will certainly use. Hydrated on admission, renal failure much better and now creatinine close to usual baseline. Follow.  HTN (hypertension): Blood pressure is reasonably well-controlled-continue Catapres, amlodipine decreased to 5 mg. Given ARF with underlying stage III chronic kidney disease-would continue to hold losartan and HCTZ for now. Follow and adjust accordingly.  Chronic back pain: On chronic narcotics at home-continue.  Bradycardia: TSH within normal limits, suspect this may be from clonidine-we will decrease the dose slightly to 0.2 mg. Given potential for rebound hypertension-would not want to discontinue it abruptly.  DM (diabetes mellitus) type 2: CBGs stable with SSI, continue to hold oral hypoglycemic agents.  Dyslipidemia: Continue statin.  BPH (benign prostatic hyperplasia): Does acknowledge having BPH symptoms a few months ago-currently seems to be very well-controlled-cautiously continue with Flomax-if orthostatics persist-may need to discontinue  Flomax.  Disposition: Remain inpatient  Antimicrobial agents  See below  Anti-infectives    None      DVT Prophylaxis: Prophylactic Heparin  Code Status: Full code   Family Communication None at bedside  Procedures: None  CONSULTS:  None  Time spent 30 minutes-Greater than 50% of this time was spent in counseling, explanation of diagnosis, planning of further management, and coordination of care.  MEDICATIONS: Scheduled Meds: . amLODipine  5 mg Oral Daily  . cloNIDine  0.3 mg Transdermal Weekly  . DULoxetine  60 mg Oral Daily  . heparin  5,000 Units Subcutaneous 3 times per day  . insulin aspart  0-9 Units Subcutaneous TID WC  . loratadine  10 mg Oral Daily  . morphine  60 mg Oral Q12H  . polyethylene glycol  17 g Oral Daily  . rosuvastatin  10 mg Oral q1800  . tamsulosin  0.4 mg Oral QHS   Continuous Infusions:  PRN Meds:.acetaminophen **OR** acetaminophen, albuterol, clobetasol cream, docusate sodium, famotidine, ondansetron **OR** ondansetron (ZOFRAN) IV, oxyCODONE-acetaminophen **AND** oxyCODONE    PHYSICAL EXAM: Vital signs in last 24 hours: Filed Vitals:   09/11/15 2211 09/12/15 0016 09/12/15 0030 09/12/15 0542  BP: 138/81 147/87 160/81 142/82  Pulse: 48 53 52 52  Temp:   97.9 F (36.6 C) 97.9 F (36.6 C)  TempSrc:   Oral Oral  Resp: 20 13  14   Height:   5\' 10"  (1.778 m)   Weight:   96.253 kg (212 lb 3.2 oz)   SpO2: 100% 100% 100% 100%    Weight change:  Filed Weights   09/12/15 0030  Weight: 96.253 kg (212 lb 3.2 oz)   Body mass index is 30.45 kg/(m^2).   Gen Exam: Awake and alert with clear speech.  Neck: Supple, No JVD.  Chest: B/L Clear.   CVS: S1 S2 Regular, no murmurs.  Abdomen: soft, BS +, non tender, non distended.  Extremities: no edema, lower extremities warm to touch. Neurologic: Non Focal.   Skin: No Rash.   Wounds: N/A.   Intake/Output from previous day:  Intake/Output Summary (Last 24 hours) at 09/12/15  1338 Last data filed at 09/12/15 0542  Gross per 24 hour  Intake 933.33 ml  Output      0 ml  Net 933.33 ml     LAB RESULTS: CBC  Recent Labs Lab 09/11/15 2012 09/12/15 0134  WBC 7.1 5.6  HGB 11.9* 11.2*  HCT 36.1* 34.3*  PLT 204 183  MCV 76.5* 78.1  MCH 25.2* 25.5*  MCHC 33.0 32.7  RDW 13.6 13.7    Chemistries   Recent Labs Lab 09/11/15 2012 09/12/15 0134  NA 140 140  K 4.2 3.9  CL 103 107  CO2 26 25  GLUCOSE 119* 116*  BUN 29* 30*  CREATININE 2.24* 1.97*  CALCIUM 9.7 8.9    CBG:  Recent Labs Lab 09/11/15 1647 09/11/15 2013 09/12/15 0805 09/12/15 1317  GLUCAP 114* 113* 100* 95    GFR Estimated Creatinine Clearance: 44.7 mL/min (by C-G formula based on Cr of 1.97).  Coagulation profile No results for input(s): INR, PROTIME in the last 168 hours.  Cardiac Enzymes  Recent Labs Lab 09/12/15 0134  TROPONINI <0.03    Invalid input(s): POCBNP No results for input(s): DDIMER in the last 72 hours. No results for input(s): HGBA1C in the last 72 hours. No results for input(s): CHOL, HDL, LDLCALC, TRIG, CHOLHDL, LDLDIRECT in the last 72 hours.  Recent Labs  09/12/15 0432  TSH 0.909   No results for input(s): VITAMINB12, FOLATE, FERRITIN, TIBC, IRON, RETICCTPCT in the last 72 hours. No results for input(s): LIPASE, AMYLASE in the last 72 hours.  Urine Studies No results for input(s): UHGB, CRYS in the last 72 hours.  Invalid input(s): UACOL, UAPR, USPG, UPH, UTP, UGL, UKET, UBIL, UNIT, UROB, ULEU, UEPI, UWBC, URBC, UBAC, CAST, UCOM, BILUA  MICROBIOLOGY: No results found for this or any previous visit (from the past 240 hour(s)).  RADIOLOGY STUDIES/RESULTS: Dg Chest 2 View  09/11/2015  CLINICAL DATA:  Subacute onset of dizziness. Nasal congestion. Initial encounter. EXAM: CHEST  2 VIEW COMPARISON:  None. FINDINGS: The lungs are well-aerated and clear. There is no evidence of focal opacification, pleural effusion or pneumothorax. The heart is  normal in size; the mediastinal contour is within normal limits. No acute osseous abnormalities are seen. Lumbar spinal fusion hardware is partially imaged. IMPRESSION: No acute cardiopulmonary process seen. Electronically Signed   By: Garald Balding M.D.   On: 09/11/2015 21:55    Oren Binet, MD  [facility] Pager:[phone]  If 7PM-7AM, please contact night-coverage [email or web address] Password [email or web address] 09/12/2015, 1:38 PM

## 2015-09-12 NOTE — H&P (Signed)
[facility] History and Physical  Anthony Moran [id number] DOB: 04-29-1951 DOA: 09/11/2015  Referring physician: ED PCP: Elyn Peers, MD   Chief Complaint:  Lightheadedness  HPI:  Anthony Moran is a 65 year old male with past medical history significant for diabetes mellitus 2, chronic back pain, CKD stage III; results with generalized complaints of lightheadedness. He reports a two-week history of just not feeling well. He notes whenever he changes positions or stands up he feels lightheaded like he is almost about to pass out. Denies having any loss of consciousness or falls. He reports the only medication that had been started in the last 2-3 months was Januvia. Denies having any fever, shortness of breath, nausea, vomiting, abdominal pain, or diarrhea. Only symptoms that he complains of is decreased appetite over the last 2-3 days, mild increased urinary frequency(yesterday only),and some mild sinus congestion which she relates to history of allergies.  Upon admission patient was seen to be significantly orthostatic with blood pressure 160/81 laying, and dropping to 117/73 standing.  Review of Systems  Constitutional: Positive for malaise/fatigue. Negative for fever and chills.  HENT: Negative for tinnitus.   Eyes: Negative for photophobia and pain.  Respiratory: Negative for sputum production and shortness of breath.   Cardiovascular: Negative for claudication and leg swelling.  Gastrointestinal: Negative for vomiting, abdominal pain, diarrhea and blood in stool.  Genitourinary: Positive for frequency. Negative for dysuria and urgency.  Musculoskeletal: Negative for back pain and neck pain.  Skin: Negative for itching and rash.  Neurological: Positive for dizziness. Negative for speech change and focal weakness.  Endo/Heme/Allergies: Positive for environmental allergies.  Psychiatric/Behavioral: Negative for hallucinations. The patient is not nervous/anxious.           Past Medical History  Diagnosis Date  . Hypertension   . Diabetes mellitus   . Anxiety   . Depression   . Arthritis   . Asthma   . Cervical spondylosis without myelopathy   . Spinal stenosis in cervical region   . Lumbar post-laminectomy syndrome   . Lumbosacral neuritis   . Mononeuritis multiplex      Past Surgical History  Procedure Laterality Date  . Lumbar fusion      L 4-5, L5 si      Social History:  reports that he has never smoked. He has never used smokeless tobacco. He reports that he does not drink alcohol or use illicit drugs. Where does patient live--home  and with whom if at home?Wife  Can patient participate in ADLs?Yes  Allergies  Allergen Reactions  . Isosorb Dinitrate-Hydralazine Rash    Family History  Problem Relation Age of Onset  . Breast cancer Maternal Aunt   . Prostate cancer Father   . Hypertension Sister        Prior to Admission medications   Medication Sig Start Date End Date Taking? Authorizing Provider  amLODipine (NORVASC) 10 MG tablet Take 10 mg by mouth daily.     Yes Historical Provider, MD  clobetasol cream (TEMOVATE) [id number] % Apply 1 application topically 2 (two) times daily as needed (rash).    Yes Lucianne Lei, MD  cloNIDine (CATAPRES - DOSED IN MG/24 HR) 0.3 mg/24hr Place 1 patch onto the skin once a week.   Yes Historical Provider, MD  clotrimazole-betamethasone (LOTRISONE) cream Apply 1 application topically 2 (two) times daily as needed.    Yes Lucianne Lei, MD  diazepam (VALIUM) 5 MG tablet Take 1 tablet (5 mg total) by mouth every 6 (six) hours as  needed for muscle spasms. 07/06/14  Yes Newman Pies, MD  docusate sodium (COLACE) 100 MG capsule Take 1 capsule (100 mg total) by mouth 2 (two) times daily. 07/06/14  Yes Newman Pies, MD  DULoxetine (CYMBALTA) 60 MG capsule TAKE ONE CAPSULE BY MOUTH EVERY DAY 05/26/15  Yes Charlett Blake, MD  famotidine (PEPCID AC) 10 MG chewable tablet Chew 10 mg by mouth  daily as needed for heartburn.   Yes Historical Provider, MD  fexofenadine (ALLEGRA) 180 MG tablet Take 180 mg by mouth daily as needed for allergies.    Yes Historical Provider, MD  Iron-FA-B Cmp-C-Biot-Probiotic (FUSION PLUS) CAPS Take 1 capsule by mouth daily. 04/03/14  Yes Historical Provider, MD  metFORMIN (GLUCOPHAGE) 850 MG tablet Take 850 mg by mouth 2 (two) times daily with a meal.   Yes Historical Provider, MD  OPANA ER, CRUSH RESISTANT, 30 MG T12A Take 1 tablet by mouth every 12 (twelve) hours. 08/05/15  Yes Bayard Hugger, NP  oxyCODONE-acetaminophen (PERCOCET) 10-325 MG tablet Take 1 tablet by mouth every 8 (eight) hours as needed for pain. 08/05/15  Yes Bayard Hugger, NP  polyethylene glycol (MIRALAX / GLYCOLAX) packet Take 17 g by mouth daily as needed.   Yes Historical Provider, MD  pregabalin (LYRICA) 100 MG capsule Take 1 capsule (100 mg total) by mouth 3 (three) times daily. 04/07/15  Yes Charlett Blake, MD  rosuvastatin (CRESTOR) 10 MG tablet Take 10 mg by mouth daily.   Yes Historical Provider, MD  sitaGLIPtin (JANUVIA) 100 MG tablet Take 100 mg by mouth daily.   Yes Historical Provider, MD  Tamsulosin HCl (FLOMAX) 0.4 MG CAPS Take 0.4 mg by mouth at bedtime.   Yes Historical Provider, MD  valsartan-hydrochlorothiazide (DIOVAN-HCT) 320-25 MG per tablet Take 1 tablet by mouth daily.   Yes Historical Provider, MD  VOLTAREN 1 % GEL Apply 1 application topically 4 (four) times daily as needed. 09/14/14  Yes Historical Provider, MD  VOLTAREN 1 % GEL APPLY 2 GRAMS TOPICALLY 4 TIMES A DAY 07/27/15  Yes Charlett Blake, MD     Physical Exam: Filed Vitals:   09/11/15 1955 09/11/15 2211 09/12/15 0016 09/12/15 0030  BP: 138/82 138/81 147/87 160/81  Pulse: 46 48 53 52  Temp:    97.9 F (36.6 C)  TempSrc:    Oral  Resp: 20 20 13    Height:    5\' 10"  (1.778 m)  Weight:    96.253 kg (212 lb 3.2 oz)  SpO2: 100% 100% 100% 100%     Constitutional: Vital signs reviewed. Patient  is a well-developed and well-nourished in no acute distress and cooperative with exam. Alert and oriented x3.  Head: Normocephalic and atraumatic  Ear: TM normal bilaterally  Mouth: no erythema or exudates, MMM  Eyes: PERRL, EOMI, conjunctivae normal, No scleral icterus.  Neck: Supple, Trachea midline normal ROM, No JVD, mass, thyromegaly, or carotid bruit present.  Cardiovascular: RRR, S1 normal, S2 normal, no MRG, pulses symmetric and intact bilaterally  Pulmonary/Chest: CTAB, no wheezes, rales, or rhonchi  Abdominal: Soft. Non-tender, non-distended, bowel sounds are normal, no masses, organomegaly, or guarding present.  GU: no CVA tenderness Musculoskeletal: No joint deformities, erythema, or stiffness, ROM full and no nontender Ext: no edema and no cyanosis, pulses palpable bilaterally (DP and PT)  Hematology: no cervical, inginal, or axillary adenopathy.  Neurological: A&O x3, Strenght is normal and symmetric bilaterally, cranial nerve II-XII are grossly intact, no focal motor deficit, sensory intact to light  touch bilaterally.  Skin: Warm, dry and intact. No rash, cyanosis, or clubbing.  Psychiatric: Normal mood and affect. speech and behavior is normal. Judgment and thought content normal. Cognition and memory are normal.      Data Review   Micro Results No results found for this or any previous visit (from the past 240 hour(s)).  Radiology Reports Dg Chest 2 View  09/11/2015  CLINICAL DATA:  Subacute onset of dizziness. Nasal congestion. Initial encounter. EXAM: CHEST  2 VIEW COMPARISON:  None. FINDINGS: The lungs are well-aerated and clear. There is no evidence of focal opacification, pleural effusion or pneumothorax. The heart is normal in size; the mediastinal contour is within normal limits. No acute osseous abnormalities are seen. Lumbar spinal fusion hardware is partially imaged. IMPRESSION: No acute cardiopulmonary process seen. Electronically Signed   By: Garald Balding M.D.    On: 09/11/2015 21:55     CBC  Recent Labs Lab 09/11/15 2012  WBC 7.1  HGB 11.9*  HCT 36.1*  PLT 204  MCV 76.5*  MCH 25.2*  MCHC 33.0  RDW 13.6    Chemistries   Recent Labs Lab 09/11/15 2012  NA 140  K 4.2  CL 103  CO2 26  GLUCOSE 119*  BUN 29*  CREATININE 2.24*  CALCIUM 9.7   ------------------------------------------------------------------------------------------------------------------ estimated creatinine clearance is 39.3 mL/min (by C-G formula based on Cr of 2.24). ------------------------------------------------------------------------------------------------------------------ No results for input(s): HGBA1C in the last 72 hours. ------------------------------------------------------------------------------------------------------------------ No results for input(s): CHOL, HDL, LDLCALC, TRIG, CHOLHDL, LDLDIRECT in the last 72 hours. ------------------------------------------------------------------------------------------------------------------ No results for input(s): TSH, T4TOTAL, T3FREE, THYROIDAB in the last 72 hours.  Invalid input(s): FREET3 ------------------------------------------------------------------------------------------------------------------ No results for input(s): VITAMINB12, FOLATE, FERRITIN, TIBC, IRON, RETICCTPCT in the last 72 hours.  Coagulation profile No results for input(s): INR, PROTIME in the last 168 hours.  No results for input(s): DDIMER in the last 72 hours.  Cardiac Enzymes No results for input(s): CKMB, TROPONINI, MYOGLOBIN in the last 168 hours.  Invalid input(s): CK ------------------------------------------------------------------------------------------------------------------ Invalid input(s): POCBNP   CBG:  Recent Labs Lab 09/11/15 1647 09/11/15 2013  GLUCAP 114* 113*       EKG: Independently reviewed. Sinus bradycardia with a rate of 59   Assessment/Plan Orthostatic hypotension - Admit to  a telemetry bed - Checking TSH and a.m. cortisol - IV fluids normal saline at 100 mL per hour - Repeat orthostatic vital signs  Bradycardia: Heart rates from noted to be as low as 46 on admission. -  Continue to monitor follow-up telemetry  Acute kidney injury on chronic kidney disease: Patient BUN of 29 and a creatinine of 2.24 on admission. Suspect prerenal in nature. - Follow-up repeat BMP   Diabetes mellitus type 2  - Held metformin and sitagliptin - CBGs every before meals with sensitive sliding scale insulin for now  Essential hypertension - Continue amlodipine, clonidine patch - Held Diovan secondary to acute kidney injury   Chronic back pain: Patient with a history laminectomy now with post laminectomy syndrome  -Continue Cymbalta, pharmacy substitution for Opana prn , and oxycodone prn  BPH - Continue Flomax  Heparin for DVT prophylaxis   Code Status:   full Family Communication: bedside Disposition Plan: admit   Total time spent 55 minutes.Greater than 50% of this time was spent in counseling, explanation of diagnosis, planning of further management, and coordination of care  [facility] Pager [phone]  If 7PM-7AM, please contact night-coverage [email or web address] Password [email or web address] 09/12/2015, 12:51 AM

## 2015-09-13 DIAGNOSIS — G8929 Other chronic pain: Secondary | ICD-10-CM

## 2015-09-13 DIAGNOSIS — M549 Dorsalgia, unspecified: Secondary | ICD-10-CM

## 2015-09-13 DIAGNOSIS — R001 Bradycardia, unspecified: Secondary | ICD-10-CM

## 2015-09-13 DIAGNOSIS — N400 Enlarged prostate without lower urinary tract symptoms: Secondary | ICD-10-CM

## 2015-09-13 DIAGNOSIS — E1310 Other specified diabetes mellitus with ketoacidosis without coma: Secondary | ICD-10-CM

## 2015-09-13 LAB — BASIC METABOLIC PANEL
ANION GAP: 10 (ref 5–15)
BUN: 26 mg/dL — ABNORMAL HIGH (ref 6–20)
CALCIUM: 9.3 mg/dL (ref 8.9–10.3)
CO2: 28 mmol/L (ref 22–32)
Chloride: 105 mmol/L (ref 101–111)
Creatinine, Ser: 1.83 mg/dL — ABNORMAL HIGH (ref 0.61–1.24)
GFR, EST AFRICAN AMERICAN: 44 mL/min — AB (ref >60)
GFR, EST NON AFRICAN AMERICAN: 38 mL/min — AB (ref >60)
Glucose, Bld: 112 mg/dL — ABNORMAL HIGH (ref 65–99)
Potassium: 4.2 mmol/L (ref 3.5–5.1)
SODIUM: 143 mmol/L (ref 135–145)

## 2015-09-13 LAB — GLUCOSE, CAPILLARY: GLUCOSE-CAPILLARY: 101 mg/dL — AB (ref 65–99)

## 2015-09-13 MED ORDER — CLONIDINE HCL 0.2 MG/24HR TD PTWK: 0.2000 mg | MEDICATED_PATCH | TRANSDERMAL | Status: DC

## 2015-09-13 MED ORDER — AMLODIPINE BESYLATE 10 MG PO TABS: 5.0000 mg | ORAL_TABLET | Freq: Every day | ORAL | Status: AC

## 2015-09-13 NOTE — Progress Notes (Signed)
PT Cancellation Note / Screen  Patient Details Name: Anthony Moran MRN: [id number] DOB: 1950/11/05   Cancelled Treatment:    Reason Eval/Treat Not Completed: PT screened, no needs identified, will sign off Pt discharged.  RN reports pt ambulated this morning and does not present with any needs prior to d/c.   Fleming Prill,KATHrine E 09/13/2015, 8:48 AM Carmelia Bake, PT, DPT 09/13/2015 Pager: [phone]

## 2015-09-13 NOTE — Discharge Summary (Signed)
PATIENT DETAILS Name: Anthony Moran Age: 65 y.o. Sex: male Date of Birth: [birth year] MRN: [id number]. Admitting Physician: Norval Morton, MD [id number] J, MD  Admit Date: 09/11/2015 Discharge date: 09/13/2015  Recommendations for Outpatient Follow-up:  1. Developed severe and symptomatic orthostatic hypotension-HCTZ/valsartan discontinued, amlodipine decreased to 5 mg. Currently no symptoms with 0.2 mg clonidine transdermally, 5 mg of amlodipine and 0.4 mg of Flomax. Please reassess at next visit, may need to further modify/adjust medications (please see below) 2. Has asymptomatic bradycardia-we will need to slowly taper off clonidine, dose has been reduced to 0.2 mg transdermally. 3. Please repeat CBC/BMET at next visit   PRIMARY DISCHARGE DIAGNOSIS:  Principal Problem:   Orthostatic hypotension Active Problems:   HTN (hypertension)   Chronic back pain   Bradycardia   DM (diabetes mellitus) type 2, uncontrolled, with ketoacidosis (HCC)   BPH (benign prostatic hyperplasia)      PAST MEDICAL HISTORY: Past Medical History  Diagnosis Date  . Hypertension   . Diabetes mellitus   . Anxiety   . Depression   . Arthritis   . Asthma   . Cervical spondylosis without myelopathy   . Spinal stenosis in cervical region   . Lumbar post-laminectomy syndrome   . Lumbosacral neuritis   . Mononeuritis multiplex     DISCHARGE MEDICATIONS: Current Discharge Medication List    CONTINUE these medications which have CHANGED   Details  amLODipine (NORVASC) 10 MG tablet Take 0.5 tablets (5 mg total) by mouth daily. Qty: 30 tablet, Refills: 0    cloNIDine (CATAPRES - DOSED IN MG/24 HR) 0.2 mg/24hr patch Place 1 patch (0.2 mg total) onto the skin once a week. Qty: 4 patch, Refills: 0      CONTINUE these medications which have NOT CHANGED   Details  clobetasol cream (TEMOVATE) [id number] % Apply 1 application topically 2 (two) times daily as needed (rash).       clotrimazole-betamethasone (LOTRISONE) cream Apply 1 application topically 2 (two) times daily as needed.     diazepam (VALIUM) 5 MG tablet Take 1 tablet (5 mg total) by mouth every 6 (six) hours as needed for muscle spasms. Qty: 50 tablet, Refills: 1    docusate sodium (COLACE) 100 MG capsule Take 1 capsule (100 mg total) by mouth 2 (two) times daily. Qty: 50 capsule, Refills: 0    DULoxetine (CYMBALTA) 60 MG capsule TAKE ONE CAPSULE BY MOUTH EVERY DAY Qty: 30 capsule, Refills: 5    famotidine (PEPCID AC) 10 MG chewable tablet Chew 10 mg by mouth daily as needed for heartburn.    fexofenadine (ALLEGRA) 180 MG tablet Take 180 mg by mouth daily as needed for allergies.     Iron-FA-B Cmp-C-Biot-Probiotic (FUSION PLUS) CAPS Take 1 capsule by mouth daily. Refills: 3    OPANA ER, CRUSH RESISTANT, 30 MG T12A Take 1 tablet by mouth every 12 (twelve) hours. Qty: 60 tablet, Refills: 0    oxyCODONE-acetaminophen (PERCOCET) 10-325 MG tablet Take 1 tablet by mouth every 8 (eight) hours as needed for pain. Qty: 90 tablet, Refills: 0    polyethylene glycol (MIRALAX / GLYCOLAX) packet Take 17 g by mouth daily as needed.    pregabalin (LYRICA) 100 MG capsule Take 1 capsule (100 mg total) by mouth 3 (three) times daily. Qty: 90 capsule, Refills: 5    rosuvastatin (CRESTOR) 10 MG tablet Take 10 mg by mouth daily.    sitaGLIPtin (JANUVIA) 100 MG tablet Take 100 mg by mouth daily.    Tamsulosin  HCl (FLOMAX) 0.4 MG CAPS Take 0.4 mg by mouth at bedtime.    VOLTAREN 1 % GEL Apply 1 application topically 4 (four) times daily as needed. Refills: 2      STOP taking these medications     metFORMIN (GLUCOPHAGE) 850 MG tablet      valsartan-hydrochlorothiazide (DIOVAN-HCT) 320-25 MG per tablet         ALLERGIES:   Allergies  Allergen Reactions  . Isosorb Dinitrate-Hydralazine Rash    BRIEF HPI:  See H&P, Labs, Consult and Test reports for all details in brief, patient is a 65 year old  male with history of chronic kidney disease stage III, type 2 diabetes who presented with lightheadedness. He was found to be profoundly orthostatic and admitted for further evaluation and treatment  CONSULTATIONS:   None  PERTINENT RADIOLOGIC STUDIES: Dg Chest 2 View  09/11/2015  CLINICAL DATA:  Subacute onset of dizziness. Nasal congestion. Initial encounter. EXAM: CHEST  2 VIEW COMPARISON:  None. FINDINGS: The lungs are well-aerated and clear. There is no evidence of focal opacification, pleural effusion or pneumothorax. The heart is normal in size; the mediastinal contour is within normal limits. No acute osseous abnormalities are seen. Lumbar spinal fusion hardware is partially imaged. IMPRESSION: No acute cardiopulmonary process seen. Electronically Signed   By: Garald Balding M.D.   On: 09/11/2015 21:55     PERTINENT LAB RESULTS: CBC:  Recent Labs  09/11/15 2012 09/12/15 0134  WBC 7.1 5.6  HGB 11.9* 11.2*  HCT 36.1* 34.3*  PLT 204 183   CMET CMP     Component Value Date/Time   NA 143 09/13/2015 0440   K 4.2 09/13/2015 0440   CL 105 09/13/2015 0440   CO2 28 09/13/2015 0440   GLUCOSE 112* 09/13/2015 0440   BUN 26* 09/13/2015 0440   CREATININE 1.83* 09/13/2015 0440   CALCIUM 9.3 09/13/2015 0440   GFRNONAA 38* 09/13/2015 0440   GFRAA 44* 09/13/2015 0440    GFR Estimated Creatinine Clearance: 48.1 mL/min (by C-G formula based on Cr of 1.83). No results for input(s): LIPASE, AMYLASE in the last 72 hours.  Recent Labs  09/12/15 0134  TROPONINI <0.03   Invalid input(s): POCBNP No results for input(s): DDIMER in the last 72 hours. No results for input(s): HGBA1C in the last 72 hours. No results for input(s): CHOL, HDL, LDLCALC, TRIG, CHOLHDL, LDLDIRECT in the last 72 hours.  Recent Labs  09/12/15 0432  TSH 0.909   No results for input(s): VITAMINB12, FOLATE, FERRITIN, TIBC, IRON, RETICCTPCT in the last 72 hours. Coags: No results for input(s): INR in the last  72 hours.  Invalid input(s): PT Microbiology: No results found for this or any previous visit (from the past 240 hour(s)).   BRIEF HOSPITAL COURSE:  Orthostatic hypotension: Multifactorial etiology-poor oral intake-along with continued use of diuretics and other numerous antihypertensives likely causing orthostatic. He was managed with IV fluids, HCTZ and valsartan was placed on hold as patient had significant worsening of his baseline chronic kidney disease. TED hose was placed. Amlodipine was decreased to 5 mg, clonidine was decreased to 0.2 mg transdermally, he'll be cautiously continued with his Flomax as he claims he has significant symptoms of prostatism till a few months back. On the morning of discharge, patient is completely asymptomatic without any lightheadedness, he has ambulated in the hallway without major issues-he is still orthostatic but no longer symptomatic. Because of his history of diabetes, he could have underlying autonomic dysfunction causing orthostatic hypotension as well.  In any event since he is symptomatic, he is stable for discharge, PCP will need to reassess and adjust antihypertensives at next visit.   Acute on chronic renal failure stage III:ARF likely secondary to prerenal azotemia-from dehydration, HCTZ and valsartan. Patient was hydrated cautiously, HCTZ/valsartan has  been discontinued. Creatinine by day of discharge is close to his usual baseline. Follow closely in the outpatient setting.  Sinus bradycardia: TSH within normal limits, he is completely asymptomatic. His heart rate quickly increases to the 70s with ambulation. He has been on transdermal clonidine for a long time, I suspect that he is best served by slowly tapering him off this medication. We have decreased her dose to 0.2 mg, PCP will need to taper it down further tinnitus discontinued. I have asked patient to make an appointment with his PCP in the next few days.  HTN (hypertension): Blood pressure  is reasonably well-controlled-continue Catapres 0.2 mg transdermally, 5 mg of amlodipine decreased to 5 mg. Given ARF with underlying stage III chronic kidney disease-would continue to hold losartan and HCTZ for now. PCP to follow and adjust accordingly.  Chronic back pain: On chronic narcotics at home-continue.  DM (diabetes mellitus) type 2: CBGs stable with SSI while inpatient, since creatinine is around 1.8-I have discontinued metformin. Januvia is being continued. Further adjustment of his oral hypoglycemics has been deferred to his PCP.  Dyslipidemia: Continue statin.  BPH (benign prostatic hyperplasia): Does acknowledge having BPH symptoms a few months ago-currently seems to be very well-controlled-cautiously continue with Flomax-if orthostatics persist-may need to discontinue Flomax.  TODAY-DAY OF DISCHARGE:  Subjective:   Anthony Moran today has no headache,no chest abdominal pain,no new weakness tingling or numbness, feels much better wants to go home today.   Objective:   Blood pressure 142/82, pulse 52, temperature 98.8 F (37.1 C), temperature source Oral, resp. rate 18, height 5\' 10"  (1.778 m), weight 96.253 kg (212 lb 3.2 oz), SpO2 99 %.  Intake/Output Summary (Last 24 hours) at 09/13/15 0801 Last data filed at 09/13/15 0700  Gross per 24 hour  Intake    840 ml  Output      0 ml  Net    840 ml   Filed Weights   09/12/15 0030  Weight: 96.253 kg (212 lb 3.2 oz)    Exam Awake Alert, Oriented *3, No new F.N deficits, Normal affect [place].AT,PERRAL Supple Neck,No JVD, No cervical lymphadenopathy appriciated.  Symmetrical Chest wall movement, Good air movement bilaterally, CTAB RRR,No Gallops,Rubs or new Murmurs, No Parasternal Heave +ve B.Sounds, Abd Soft, Non tender, No organomegaly appriciated, No rebound -guarding or rigidity. No Cyanosis, Clubbing or edema, No new Rash or bruise  DISCHARGE CONDITION: Stable  DISPOSITION: Home  DISCHARGE INSTRUCTIONS:      Activity:  As tolerated   Get Medicines reviewed and adjusted: Please take all your medications with you for your next visit with your Primary MD  Please request your Primary MD to go over all hospital tests and procedure/radiological results at the follow up, please ask your Primary MD to get all Hospital records sent to his/her office.  If you experience worsening of your admission symptoms, develop shortness of breath, life threatening emergency, suicidal or homicidal thoughts you must seek medical attention immediately by calling 911 or calling your MD immediately  if symptoms less severe.  You must read complete instructions/literature along with all the possible adverse reactions/side effects for all the Medicines you take and that have been prescribed to you. Take any new Medicines  after you have completely understood and accpet all the possible adverse reactions/side effects.   Do not drive when taking Pain medications.   Do not take more than prescribed Pain, Sleep and Anxiety Medications  Special Instructions: If you have smoked or chewed Tobacco  in the last 2 yrs please stop smoking, stop any regular Alcohol  and or any Recreational drug use.  Wear Seat belts while driving.  Please note  You were cared for by a hospitalist during your hospital stay. Once you are discharged, your primary care physician will handle any further medical issues. Please note that NO REFILLS for any discharge medications will be authorized once you are discharged, as it is imperative that you return to your primary care physician (or establish a relationship with a primary care physician if you do not have one) for your aftercare needs so that they can reassess your need for medications and monitor your lab values.   Diet recommendation: Diabetic Diet Heart Healthy diet  Discharge Instructions    Call MD for:  persistant dizziness or light-headedness    Complete by:  As directed      Diet - low  sodium heart healthy    Complete by:  As directed      Diet Carb Modified    Complete by:  As directed      Increase activity slowly    Complete by:  As directed            Follow-up Information    Follow up with Elyn Peers, MD. Schedule an appointment as soon as possible for a visit in 3 days.   Specialty:  Family Medicine   Why:  Hospital follow up   Contact information:   [address] [place] [address] [phone]     Total Time spent on discharge equals  45 minutes.  SignedOren Binet 09/13/2015 8:01 AM

## 2015-10-08 ENCOUNTER — Emergency Department (HOSPITAL_COMMUNITY)
Admission: EM | Admit: 2015-10-08 | Discharge: 2015-10-08 | Disposition: A | Discharge status: Home / Self Care | Payer: MEDICAID | Attending: Emergency Medicine | Admitting: Emergency Medicine

## 2015-10-08 ENCOUNTER — Encounter (HOSPITAL_COMMUNITY): Payer: Self-pay | Admitting: Emergency Medicine

## 2015-10-08 ENCOUNTER — Emergency Department (HOSPITAL_COMMUNITY): Payer: MEDICAID

## 2015-10-08 DIAGNOSIS — E119 Type 2 diabetes mellitus without complications: Secondary | ICD-10-CM | POA: Insufficient documentation

## 2015-10-08 DIAGNOSIS — F419 Anxiety disorder, unspecified: Secondary | ICD-10-CM | POA: Insufficient documentation

## 2015-10-08 DIAGNOSIS — R0600 Dyspnea, unspecified: Secondary | ICD-10-CM

## 2015-10-08 DIAGNOSIS — J45901 Unspecified asthma with (acute) exacerbation: Secondary | ICD-10-CM | POA: Insufficient documentation

## 2015-10-08 DIAGNOSIS — Z8701 Personal history of pneumonia (recurrent): Secondary | ICD-10-CM | POA: Insufficient documentation

## 2015-10-08 DIAGNOSIS — R5383 Other fatigue: Secondary | ICD-10-CM | POA: Insufficient documentation

## 2015-10-08 DIAGNOSIS — Z7984 Long term (current) use of oral hypoglycemic drugs: Secondary | ICD-10-CM | POA: Insufficient documentation

## 2015-10-08 DIAGNOSIS — Z79899 Other long term (current) drug therapy: Secondary | ICD-10-CM | POA: Insufficient documentation

## 2015-10-08 DIAGNOSIS — Z8739 Personal history of other diseases of the musculoskeletal system and connective tissue: Secondary | ICD-10-CM | POA: Insufficient documentation

## 2015-10-08 DIAGNOSIS — I10 Essential (primary) hypertension: Secondary | ICD-10-CM

## 2015-10-08 DIAGNOSIS — Z8669 Personal history of other diseases of the nervous system and sense organs: Secondary | ICD-10-CM | POA: Insufficient documentation

## 2015-10-08 DIAGNOSIS — M1990 Unspecified osteoarthritis, unspecified site: Secondary | ICD-10-CM | POA: Insufficient documentation

## 2015-10-08 HISTORY — DX: Pneumonia, unspecified organism: J18.9

## 2015-10-08 LAB — COMPREHENSIVE METABOLIC PANEL
ALBUMIN: 3.8 g/dL (ref 3.5–5.0)
ALK PHOS: 66 U/L (ref 38–126)
ALT: 14 U/L — ABNORMAL LOW (ref 17–63)
AST: 13 U/L — AB (ref 15–41)
Anion gap: 10 (ref 5–15)
BILIRUBIN TOTAL: 0.4 mg/dL (ref 0.3–1.2)
BUN: 21 mg/dL — AB (ref 6–20)
CALCIUM: 8.7 mg/dL — AB (ref 8.9–10.3)
CO2: 23 mmol/L (ref 22–32)
CREATININE: 1.57 mg/dL — AB (ref 0.61–1.24)
Chloride: 109 mmol/L (ref 101–111)
GFR calc Af Amer: 52 mL/min — ABNORMAL LOW (ref >60)
GFR, EST NON AFRICAN AMERICAN: 45 mL/min — AB (ref >60)
GLUCOSE: 245 mg/dL — AB (ref 65–99)
Potassium: 3.2 mmol/L — ABNORMAL LOW (ref 3.5–5.1)
Sodium: 142 mmol/L (ref 135–145)
TOTAL PROTEIN: 6.6 g/dL (ref 6.5–8.1)

## 2015-10-08 LAB — URINALYSIS, ROUTINE W REFLEX MICROSCOPIC
BILIRUBIN URINE: NEGATIVE
Glucose, UA: 250 mg/dL — AB
HGB URINE DIPSTICK: NEGATIVE
KETONES UR: NEGATIVE mg/dL
Leukocytes, UA: NEGATIVE
NITRITE: NEGATIVE
PH: 5.5 (ref 5.0–8.0)
Protein, ur: 100 mg/dL — AB
Specific Gravity, Urine: 1.021 (ref 1.005–1.030)

## 2015-10-08 LAB — CBC
HEMATOCRIT: 31 % — AB (ref 39.0–52.0)
HEMOGLOBIN: 10.1 g/dL — AB (ref 13.0–17.0)
MCH: 25.1 pg — ABNORMAL LOW (ref 26.0–34.0)
MCHC: 32.6 g/dL (ref 30.0–36.0)
MCV: 77.1 fL — AB (ref 78.0–100.0)
Platelets: 239 10*3/uL (ref 150–400)
RBC: 4.02 MIL/uL — ABNORMAL LOW (ref 4.22–5.81)
RDW: 15.3 % (ref 11.5–15.5)
WBC: 7.7 10*3/uL (ref 4.0–10.5)

## 2015-10-08 LAB — URINE MICROSCOPIC-ADD ON: Bacteria, UA: NONE SEEN

## 2015-10-08 LAB — TROPONIN I

## 2015-10-08 LAB — BRAIN NATRIURETIC PEPTIDE: B Natriuretic Peptide: 225.3 pg/mL — ABNORMAL HIGH (ref 0.0–100.0)

## 2015-10-08 MED ORDER — AMLODIPINE BESYLATE 5 MG PO TABS: 5.0000 mg | ORAL_TABLET | Freq: Every day | ORAL | Status: DC

## 2015-10-08 MED ORDER — CLONIDINE HCL 0.1 MG PO TABS
0.1000 mg | ORAL_TABLET | Freq: Once | ORAL | Status: AC
Start: 2015-10-08 — End: 2015-10-08
  Administered 2015-10-08: 0.1 mg via ORAL
  Filled 2015-10-08: qty 1

## 2015-10-08 MED ORDER — CLONIDINE HCL 0.2 MG/24HR TD PTWK
0.2000 mg | MEDICATED_PATCH | TRANSDERMAL | Status: DC
Start: 2015-10-08 — End: 2015-10-08

## 2015-10-08 NOTE — ED Notes (Signed)
Patient reports shortness of breath which began 2 weeks ago.  Reports shortness of breath on exertion.  Reports chest pain on inspiration which made him come to ER.

## 2015-10-08 NOTE — Discharge Instructions (Signed)
DASH Eating Plan You are to restart Norvasc at 5 mg a day. Your blood sugar is elevated at 245. You must follow a diabetic diet and see your family doctor for recheck next week. DASH stands for "Dietary Approaches to Stop Hypertension." The DASH eating plan is a healthy eating plan that has been shown to reduce high blood pressure (hypertension). Additional health benefits may include reducing the risk of type 2 diabetes mellitus, heart disease, and stroke. The DASH eating plan may also help with weight loss. WHAT DO I NEED TO KNOW ABOUT THE DASH EATING PLAN? For the DASH eating plan, you will follow these general guidelines:  Choose foods with a percent daily value for sodium of less than 5% (as listed on the food label).  Use salt-free seasonings or herbs instead of table salt or sea salt.  Check with your health care provider or pharmacist before using salt substitutes.  Eat lower-sodium products, often labeled as "lower sodium" or "no salt added."  Eat fresh foods.  Eat more vegetables, fruits, and low-fat dairy products.  Choose whole grains. Look for the word "whole" as the first word in the ingredient list.  Choose fish and skinless chicken or [place] more often than red meat. Limit fish, poultry, and meat to 6 oz (170 g) each day.  Limit sweets, desserts, sugars, and sugary drinks.  Choose heart-healthy fats.  Limit cheese to 1 oz (28 g) per day.  Eat more home-cooked food and less restaurant, buffet, and fast food.  Limit fried foods.  Cook foods using methods other than frying.  Limit canned vegetables. If you do use them, rinse them well to decrease the sodium.  When eating at a restaurant, ask that your food be prepared with less salt, or no salt if possible. WHAT FOODS CAN I EAT? Seek help from a dietitian for individual calorie needs. Grains Whole grain or whole wheat bread. Brown rice. Whole grain or whole wheat pasta. Quinoa, bulgur, and whole grain cereals.  Low-sodium cereals. Corn or whole wheat flour tortillas. Whole grain cornbread. Whole grain crackers. Low-sodium crackers. Vegetables Fresh or frozen vegetables (raw, steamed, roasted, or grilled). Low-sodium or reduced-sodium tomato and vegetable juices. Low-sodium or reduced-sodium tomato sauce and paste. Low-sodium or reduced-sodium canned vegetables.  Fruits All fresh, canned (in natural juice), or frozen fruits. Meat and Other Protein Products Ground beef (85% or leaner), grass-fed beef, or beef trimmed of fat. Skinless chicken or [place]. Ground chicken or [place]. Pork trimmed of fat. All fish and seafood. Eggs. Dried beans, peas, or lentils. Unsalted nuts and seeds. Unsalted canned beans. Dairy Low-fat dairy products, such as skim or 1% milk, 2% or reduced-fat cheeses, low-fat ricotta or cottage cheese, or plain low-fat yogurt. Low-sodium or reduced-sodium cheeses. Fats and Oils Tub margarines without trans fats. Light or reduced-fat mayonnaise and salad dressings (reduced sodium). Avocado. Safflower, olive, or canola oils. Natural peanut or almond butter. Other Unsalted popcorn and pretzels. The items listed above may not be a complete list of recommended foods or beverages. Contact your dietitian for more options. WHAT FOODS ARE NOT RECOMMENDED? Grains White bread. White pasta. White rice. Refined cornbread. Bagels and croissants. Crackers that contain trans fat. Vegetables Creamed or fried vegetables. Vegetables in a cheese sauce. Regular canned vegetables. Regular canned tomato sauce and paste. Regular tomato and vegetable juices. Fruits Dried fruits. Canned fruit in light or heavy syrup. Fruit juice. Meat and Other Protein Products Fatty cuts of meat. Ribs, chicken wings, bacon, sausage, bologna, salami,  chitterlings, fatback, hot dogs, bratwurst, and packaged luncheon meats. Salted nuts and seeds. Canned beans with salt. Dairy Whole or 2% milk, cream, half-and-half, and cream  cheese. Whole-fat or sweetened yogurt. Full-fat cheeses or blue cheese. Nondairy creamers and whipped toppings. Processed cheese, cheese spreads, or cheese curds. Condiments Onion and garlic salt, seasoned salt, table salt, and sea salt. Canned and packaged gravies. Worcestershire sauce. Tartar sauce. Barbecue sauce. Teriyaki sauce. Soy sauce, including reduced sodium. Steak sauce. Fish sauce. Oyster sauce. Cocktail sauce. Horseradish. Ketchup and mustard. Meat flavorings and tenderizers. Bouillon cubes. Hot sauce. Tabasco sauce. Marinades. Taco seasonings. Relishes. Fats and Oils Butter, stick margarine, lard, shortening, ghee, and bacon fat. Coconut, palm kernel, or palm oils. Regular salad dressings. Other Pickles and olives. Salted popcorn and pretzels. The items listed above may not be a complete list of foods and beverages to avoid. Contact your dietitian for more information. WHERE CAN I FIND MORE INFORMATION? National Heart, [employer], [employer]: travelstabl[employer].com   This information is not intended to replace advice given to you by your health care provider. Make sure you discuss any questions you have with your health care provider.   Document Released: 05/17/2011 Document Revised: 06/18/2014 Document Reviewed: 04/01/2013 Elsevier Interactive Patient Education 2016 [employer]. Hypertension Hypertension, commonly called high blood pressure, is when the force of blood pumping through your arteries is too strong. Your arteries are the blood vessels that carry blood from your heart throughout your body. A blood pressure reading consists of a higher number over a lower number, such as 110/72. The higher number (systolic) is the pressure inside your arteries when your heart pumps. The lower number (diastolic) is the pressure inside your arteries when your heart relaxes. Ideally you want your blood pressure below 120/80. Hypertension forces your heart to  work harder to pump blood. Your arteries may become narrow or stiff. Having untreated or uncontrolled hypertension can cause heart attack, stroke, kidney disease, and other problems. RISK FACTORS Some risk factors for high blood pressure are controllable. Others are not.  Risk factors you cannot control include:   Race. You may be at higher risk if you are African American.  Age. Risk increases with age.  Gender. Men are at higher risk than women before age 4 years. After age 69, women are at higher risk than men. Risk factors you can control include:  Not getting enough exercise or physical activity.  Being overweight.  Getting too much fat, sugar, calories, or salt in your diet.  Drinking too much alcohol. SIGNS AND SYMPTOMS Hypertension does not usually cause signs or symptoms. Extremely high blood pressure (hypertensive crisis) may cause headache, anxiety, shortness of breath, and nosebleed. DIAGNOSIS To check if you have hypertension, your health care provider will measure your blood pressure while you are seated, with your arm held at the level of your heart. It should be measured at least twice using the same arm. Certain conditions can cause a difference in blood pressure between your right and left arms. A blood pressure reading that is higher than normal on one occasion does not mean that you need treatment. If it is not clear whether you have high blood pressure, you may be asked to return on a different day to have your blood pressure checked again. Or, you may be asked to monitor your blood pressure at home for 1 or more weeks. TREATMENT Treating high blood pressure includes making lifestyle changes and possibly taking medicine. Living a healthy lifestyle can help  lower high blood pressure. You may need to change some of your habits. Lifestyle changes may include:  Following the DASH diet. This diet is high in fruits, vegetables, and whole grains. It is low in salt, red meat,  and added sugars.  Keep your sodium intake below 2,300 mg per day.  Getting at least 30-45 minutes of aerobic exercise at least 4 times per week.  Losing weight if necessary.  Not smoking.  Limiting alcoholic beverages.  Learning ways to reduce stress. Your health care provider may prescribe medicine if lifestyle changes are not enough to get your blood pressure under control, and if one of the following is true:  You are 27-45 years of age and your systolic blood pressure is above 140.  You are 22 years of age or older, and your systolic blood pressure is above 150.  Your diastolic blood pressure is above 90.  You have diabetes, and your systolic blood pressure is over [id number] or your diastolic blood pressure is over 90.  You have kidney disease and your blood pressure is above 140/90.  You have heart disease and your blood pressure is above 140/90. Your personal target blood pressure may vary depending on your medical conditions, your age, and other factors. HOME CARE INSTRUCTIONS  Have your blood pressure rechecked as directed by your health care provider.   Take medicines only as directed by your health care provider. Follow the directions carefully. Blood pressure medicines must be taken as prescribed. The medicine does not work as well when you skip doses. Skipping doses also puts you at risk for problems.  Do not smoke.   Monitor your blood pressure at home as directed by your health care provider. SEEK MEDICAL CARE IF:   You think you are having a reaction to medicines taken.  You have recurrent headaches or feel dizzy.  You have swelling in your ankles.  You have trouble with your vision. SEEK IMMEDIATE MEDICAL CARE IF:  You develop a severe headache or confusion.  You have unusual weakness, numbness, or feel faint.  You have severe chest or abdominal pain.  You vomit repeatedly.  You have trouble breathing. MAKE SURE YOU:   Understand these  instructions.  Will watch your condition.  Will get help right away if you are not doing well or get worse.   This information is not intended to replace advice given to you by your health care provider. Make sure you discuss any questions you have with your health care provider.   Document Released: 05/28/2005 Document Revised: 10/12/2014 Document Reviewed: 03/20/2013 Elsevier Interactive Patient Education 2016 [employer] Nonspecific Chest Pain  Chest pain can be caused by many different conditions. There is always a chance that your pain could be related to something serious, such as a heart attack or a blood clot in your lungs. Chest pain can also be caused by conditions that are not life-threatening. If you have chest pain, it is very important to follow up with your health care provider. CAUSES  Chest pain can be caused by:  Heartburn.  Pneumonia or bronchitis.  Anxiety or stress.  Inflammation around your heart (pericarditis) or lung (pleuritis or pleurisy).  A blood clot in your lung.  A collapsed lung (pneumothorax). It can develop suddenly on its own (spontaneous pneumothorax) or from trauma to the chest.  Shingles infection (varicella-zoster virus).  Heart attack.  Damage to the bones, muscles, and cartilage that make up your chest wall. This can include:  Bruised  bones due to injury.  Strained muscles or cartilage due to frequent or repeated coughing or overwork.  Fracture to one or more ribs.  Sore cartilage due to inflammation (costochondritis). RISK FACTORS  Risk factors for chest pain may include:  Activities that increase your risk for trauma or injury to your chest.  Respiratory infections or conditions that cause frequent coughing.  Medical conditions or overeating that can cause heartburn.  Heart disease or family history of heart disease.  Conditions or health behaviors that increase your risk of developing a blood clot.  Having had chicken  pox (varicella zoster). SIGNS AND SYMPTOMS Chest pain can feel like:  Burning or tingling on the surface of your chest or deep in your chest.  Crushing, pressure, aching, or squeezing pain.  Dull or sharp pain that is worse when you move, cough, or take a deep breath.  Pain that is also felt in your back, neck, shoulder, or arm, or pain that spreads to any of these areas. Your chest pain may come and go, or it may stay constant. DIAGNOSIS Lab tests or other studies may be needed to find the cause of your pain. Your health care provider may have you take a test called an ambulatory ECG (electrocardiogram). An ECG records your heartbeat patterns at the time the test is performed. You may also have other tests, such as:  Transthoracic echocardiogram (TTE). During echocardiography, sound waves are used to create a picture of all of the heart structures and to look at how blood flows through your heart.  Transesophageal echocardiogram (TEE).This is a more advanced imaging test that obtains images from inside your body. It allows your health care provider to see your heart in finer detail.  Cardiac monitoring. This allows your health care provider to monitor your heart rate and rhythm in real time.  Holter monitor. This is a portable device that records your heartbeat and can help to diagnose abnormal heartbeats. It allows your health care provider to track your heart activity for several days, if needed.  Stress tests. These can be done through exercise or by taking medicine that makes your heart beat more quickly.  Blood tests.  Imaging tests. TREATMENT  Your treatment depends on what is causing your chest pain. Treatment may include:  Medicines. These may include:  Acid blockers for heartburn.  Anti-inflammatory medicine.  Pain medicine for inflammatory conditions.  Antibiotic medicine, if an infection is present.  Medicines to dissolve blood clots.  Medicines to treat  coronary artery disease.  Supportive care for conditions that do not require medicines. This may include:  Resting.  Applying heat or cold packs to injured areas.  Limiting activities until pain decreases. HOME CARE INSTRUCTIONS  If you were prescribed an antibiotic medicine, finish it all even if you start to feel better.  Avoid any activities that bring on chest pain.  Do not use any tobacco products, including cigarettes, chewing tobacco, or electronic cigarettes. If you need help quitting, ask your health care provider.  Do not drink alcohol.  Take medicines only as directed by your health care provider.  Keep all follow-up visits as directed by your health care provider. This is important. This includes any further testing if your chest pain does not go away.  If heartburn is the cause for your chest pain, you may be told to keep your head raised (elevated) while sleeping. This reduces the chance that acid will go from your stomach into your esophagus.  Make lifestyle changes  as directed by your health care provider. These may include:  Getting regular exercise. Ask your health care provider to suggest some activities that are safe for you.  Eating a heart-healthy diet. A registered dietitian can help you to learn healthy eating options.  Maintaining a healthy weight.  Managing diabetes, if necessary.  Reducing stress. SEEK MEDICAL CARE IF:  Your chest pain does not go away after treatment.  You have a rash with blisters on your chest.  You have a fever. SEEK IMMEDIATE MEDICAL CARE IF:   Your chest pain is worse.  You have an increasing cough, or you cough up blood.  You have severe abdominal pain.  You have severe weakness.  You faint.  You have chills.  You have sudden, unexplained chest discomfort.  You have sudden, unexplained discomfort in your arms, back, neck, or jaw.  You have shortness of breath at any time.  You suddenly start to sweat, or  your skin gets clammy.  You feel nauseous or you vomit.  You suddenly feel light-headed or dizzy.  Your heart begins to beat quickly, or it feels like it is skipping beats. These symptoms may represent a serious problem that is an emergency. Do not wait to see if the symptoms will go away. Get medical help right away. Call your local emergency services (911 in the [place] Do not drive yourself to the hospital.   This information is not intended to replace advice given to you by your health care provider. Make sure you discuss any questions you have with your health care provider.   Document Released: 03/07/2005 Document Revised: 06/18/2014 Document Reviewed: 01/01/2014 Elsevier Interactive Patient Education 2016 [employer]. Diabetes Mellitus and Food It is important for you to manage your blood sugar (glucose) level. Your blood glucose level can be greatly affected by what you eat. Eating healthier foods in the appropriate amounts throughout the day at about the same time each day will help you control your blood glucose level. It can also help slow or prevent worsening of your diabetes mellitus. Healthy eating may even help you improve the level of your blood pressure and reach or maintain a healthy weight.  General recommendations for healthful eating and cooking habits include:  Eating meals and snacks regularly. Avoid going long periods of time without eating to lose weight.  Eating a diet that consists mainly of plant-based foods, such as fruits, vegetables, nuts, legumes, and whole grains.  Using low-heat cooking methods, such as baking, instead of high-heat cooking methods, such as deep frying. Work with your dietitian to make sure you understand how to use the Nutrition Facts information on food labels. HOW CAN FOOD AFFECT ME? Carbohydrates Carbohydrates affect your blood glucose level more than any other type of food. Your dietitian will help you determine how many carbohydrates  to eat at each meal and teach you how to count carbohydrates. Counting carbohydrates is important to keep your blood glucose at a healthy level, especially if you are using insulin or taking certain medicines for diabetes mellitus. Alcohol Alcohol can cause sudden decreases in blood glucose (hypoglycemia), especially if you use insulin or take certain medicines for diabetes mellitus. Hypoglycemia can be a life-threatening condition. Symptoms of hypoglycemia (sleepiness, dizziness, and disorientation) are similar to symptoms of having too much alcohol.  If your health care provider has given you approval to drink alcohol, do so in moderation and use the following guidelines:  Women should not have more than one drink per day,  and men should not have more than two drinks per day. One drink is equal to:  12 oz of beer.  5 oz of wine.  1 oz of hard liquor.  Do not drink on an empty stomach.  Keep yourself hydrated. Have water, diet soda, or unsweetened iced tea.  Regular soda, juice, and other mixers might contain a lot of carbohydrates and should be counted. WHAT FOODS ARE NOT RECOMMENDED? As you make food choices, it is important to remember that all foods are not the same. Some foods have fewer nutrients per serving than other foods, even though they might have the same number of calories or carbohydrates. It is difficult to get your body what it needs when you eat foods with fewer nutrients. Examples of foods that you should avoid that are high in calories and carbohydrates but low in nutrients include:  Trans fats (most processed foods list trans fats on the Nutrition Facts label).  Regular soda.  Juice.  Candy.  Sweets, such as cake, pie, doughnuts, and cookies.  Fried foods. WHAT FOODS CAN I EAT? Eat nutrient-rich foods, which will nourish your body and keep you healthy. The food you should eat also will depend on several factors, including:  The calories you need.  The  medicines you take.  Your weight.  Your blood glucose level.  Your blood pressure level.  Your cholesterol level. You should eat a variety of foods, including:  Protein.  Lean cuts of meat.  Proteins low in saturated fats, such as fish, egg whites, and beans. Avoid processed meats.  Fruits and vegetables.  Fruits and vegetables that may help control blood glucose levels, such as apples, mangoes, and yams.  Dairy products.  Choose fat-free or low-fat dairy products, such as milk, yogurt, and cheese.  Grains, bread, pasta, and rice.  Choose whole grain products, such as multigrain bread, whole oats, and brown rice. These foods may help control blood pressure.  Fats.  Foods containing healthful fats, such as nuts, avocado, olive oil, canola oil, and fish. DOES EVERYONE WITH DIABETES MELLITUS HAVE THE SAME MEAL PLAN? Because every person with diabetes mellitus is different, there is not one meal plan that works for everyone. It is very important that you meet with a dietitian who will help you create a meal plan that is just right for you.   This information is not intended to replace advice given to you by your health care provider. Make sure you discuss any questions you have with your health care provider.   Document Released: 02/22/2005 Document Revised: 06/18/2014 Document Reviewed: 04/24/2013 Elsevier Interactive Patient Education [employer].

## 2015-10-08 NOTE — ED Provider Notes (Signed)
CSN: [id number]     Arrival date & time 10/08/15  1313 History   First MD Initiated Contact with Patient 10/08/15 1431     Chief Complaint  Patient presents with  . Shortness of Breath     (Consider location/radiation/quality/duration/timing/severity/associated sxs/prior Treatment) HPI Patient stopped taking all of his blood pressure medications several weeks ago which she reports was on the recommendation of his primary care doctor. He reports he has kidney function problems and he has not yet had a appointment scheduled with the kidney doctor. He reports normal urination without pain burning or urgency. He has continued to take Flomax as prescribed by his urologist. He states that it was thought that stopping the blood pressure medications would be helpful for his kidney function. He reports for 2 weeks he's felt generally fatigued and slightly more short of breath on exertion. This is been a gradual and persistent sensation. Today he became concerned because he reports he took a deep breath and he felt a sharp pain in the center of his chest. The pain has not been constant or consistent. He has not had fever or cough. He has not had lower extremity pain or swelling. No history of blood clot. Past Medical History  Diagnosis Date  . Hypertension   . Diabetes mellitus   . Anxiety   . Depression   . Arthritis   . Asthma   . Cervical spondylosis without myelopathy   . Spinal stenosis in cervical region   . Lumbar post-laminectomy syndrome   . Lumbosacral neuritis   . Mononeuritis multiplex   . Pneumonia    Past Surgical History  Procedure Laterality Date  . Lumbar fusion      L 4-5, L5 si   Family History  Problem Relation Age of Onset  . Breast cancer Maternal Aunt   . Prostate cancer Father   . Hypertension Sister    Social History  Substance Use Topics  . Smoking status: Never Smoker   . Smokeless tobacco: Never Used  . Alcohol Use: No     Comment: rare    Review of  Systems 10 Systems reviewed and are negative for acute change except as noted in the HPI.    Allergies  Isosorb dinitrate-hydralazine  Home Medications   Prior to Admission medications   Medication Sig Start Date End Date Taking? Authorizing Provider  Tamsulosin HCl (FLOMAX) 0.4 MG CAPS Take 0.4 mg by mouth at bedtime.   Yes Historical Provider, MD  amLODipine (NORVASC) 10 MG tablet Take 0.5 tablets (5 mg total) by mouth daily. 09/13/15   Shanker Kristeen Mans, MD  amLODipine (NORVASC) 5 MG tablet Take 1 tablet (5 mg total) by mouth daily. 10/08/15   Charlesetta Shanks, MD  clobetasol cream (TEMOVATE) [id number] % Apply 1 application topically 2 (two) times daily as needed (rash).     Lucianne Lei, MD  cloNIDine (CATAPRES - DOSED IN MG/24 HR) 0.2 mg/24hr patch Place 1 patch (0.2 mg total) onto the skin once a week. 09/13/15   Shanker Kristeen Mans, MD  cloNIDine (CATAPRES - DOSED IN MG/24 HR) 0.2 mg/24hr patch Place 1 patch (0.2 mg total) onto the skin once a week. 10/08/15   Charlesetta Shanks, MD  clotrimazole-betamethasone (LOTRISONE) cream Apply 1 application topically 2 (two) times daily as needed.     Lucianne Lei, MD  diazepam (VALIUM) 5 MG tablet Take 1 tablet (5 mg total) by mouth every 6 (six) hours as needed for muscle spasms. 07/06/14   Dellis Filbert  Arnoldo Morale, MD  docusate sodium (COLACE) 100 MG capsule Take 1 capsule (100 mg total) by mouth 2 (two) times daily. 07/06/14   Newman Pies, MD  DULoxetine (CYMBALTA) 60 MG capsule TAKE ONE CAPSULE BY MOUTH EVERY DAY 05/26/15   Charlett Blake, MD  famotidine (PEPCID AC) 10 MG chewable tablet Chew 10 mg by mouth daily as needed for heartburn.    Historical Provider, MD  fexofenadine (ALLEGRA) 180 MG tablet Take 180 mg by mouth daily as needed for allergies.     Historical Provider, MD  Iron-FA-B Cmp-C-Biot-Probiotic (FUSION PLUS) CAPS Take 1 capsule by mouth daily. 04/03/14   Historical Provider, MD  OPANA ER, CRUSH RESISTANT, 30 MG T12A Take 1 tablet by mouth every  12 (twelve) hours. 08/05/15   Bayard Hugger, NP  oxyCODONE-acetaminophen (PERCOCET) 10-325 MG tablet Take 1 tablet by mouth every 8 (eight) hours as needed for pain. 08/05/15   Bayard Hugger, NP  polyethylene glycol (MIRALAX / GLYCOLAX) packet Take 17 g by mouth daily as needed for mild constipation.     Historical Provider, MD  pregabalin (LYRICA) 100 MG capsule Take 1 capsule (100 mg total) by mouth 3 (three) times daily. 04/07/15   Charlett Blake, MD  rosuvastatin (CRESTOR) 10 MG tablet Take 10 mg by mouth daily.    Historical Provider, MD  sitaGLIPtin (JANUVIA) 100 MG tablet Take 100 mg by mouth daily.    Historical Provider, MD  VOLTAREN 1 % GEL Apply 1 application topically 4 (four) times daily as needed (pain).  09/14/14   Historical Provider, MD   BP 156/83 mmHg  Pulse 60  Temp(Src) 98.5 F (36.9 C) (Oral)  Resp 20  Ht 5' 10.5" (1.791 m)  Wt 212 lb (96.163 kg)  BMI 29.98 kg/m2  SpO2 95% Physical Exam  Constitutional: He is oriented to person, place, and time. He appears well-developed and well-nourished.  HENT:  Head: Normocephalic and atraumatic.  Eyes: EOM are normal. Pupils are equal, round, and reactive to light.  Neck: Neck supple. No thyromegaly present.  Cardiovascular: Normal rate, regular rhythm, normal heart sounds and intact distal pulses.   Pulmonary/Chest: Effort normal and breath sounds normal.  Abdominal: Soft. Bowel sounds are normal. He exhibits no distension. There is no tenderness.  Musculoskeletal: Normal range of motion. He exhibits no edema or tenderness.  Lymphadenopathy:    He has no cervical adenopathy.  Neurological: He is alert and oriented to person, place, and time. He has normal strength. Coordination normal. GCS eye subscore is 4. GCS verbal subscore is 5. GCS motor subscore is 6.  Skin: Skin is warm, dry and intact.  Psychiatric: He has a normal mood and affect.    ED Course  Procedures (including critical care time) Labs Review Labs  Reviewed  CBC - Abnormal; Notable for the following:    RBC 4.02 (*)    Hemoglobin 10.1 (*)    HCT 31.0 (*)    MCV 77.1 (*)    MCH 25.1 (*)    All other components within normal limits  COMPREHENSIVE METABOLIC PANEL - Abnormal; Notable for the following:    Potassium 3.2 (*)    Glucose, Bld 245 (*)    BUN 21 (*)    Creatinine, Ser 1.57 (*)    Calcium 8.7 (*)    AST 13 (*)    ALT 14 (*)    GFR calc non Af Amer 45 (*)    GFR calc Af Amer 52 (*)  All other components within normal limits  BRAIN NATRIURETIC PEPTIDE - Abnormal; Notable for the following:    B Natriuretic Peptide 225.3 (*)    All other components within normal limits  URINALYSIS, ROUTINE W REFLEX MICROSCOPIC (NOT AT [facility]) - Abnormal; Notable for the following:    Glucose, UA 250 (*)    Protein, ur 100 (*)    All other components within normal limits  URINE MICROSCOPIC-ADD ON - Abnormal; Notable for the following:    Squamous Epithelial / LPF 0-5 (*)    All other components within normal limits  TROPONIN I    Imaging Review Dg Chest 2 View  10/08/2015  CLINICAL DATA:  65 year old male with a history of shortness of breath EXAM: CHEST - 2 VIEW COMPARISON:  09/11/2015 FINDINGS: Cardiomediastinal silhouette projects within normal limits in size and contour. No confluent airspace disease, pneumothorax, or pleural effusion. No displaced fracture. Unremarkable appearance of the upper abdomen. IMPRESSION: No radiographic evidence of acute cardiopulmonary disease. Signed, Dulcy Fanny. Earleen Newport, DO [facility] Radiology Electronically Signed   By: Corrie Mckusick D.O.   On: 10/08/2015 13:37   I have personally reviewed and evaluated these images and lab results as part of my medical decision-making.   EKG Interpretation   Date/Time:  Saturday October 08 2015 13:20:12 EDT Ventricular Rate:  77 PR Interval:  154 QRS Duration: 83 QT Interval:  373 QTC Calculation: 422 R Axis:    -32 Text Interpretation:  Sinus rhythm Left axis deviation no change from  previous Confirmed by Johnney Killian, MD, Jeannie Done [phone]) on 10/08/2015 2:47:09 PM      MDM   Final diagnoses:  Dyspnea  Other fatigue  Essential hypertension   At this time, patient is well in appearance. He had one isolated episode of of central chest pain with deep breath. This did not have features of ischemic chest pain. Patient does not appear risk for PE at this time. He does not have hypoxia, lower extremity swelling or prior history. Patient has however noted fatigue and dyspnea over approximately 2 week duration. This is approximately the time the patient has been taken off of his blood pressure medications. Patient is hypertensive upon arrival to the emergency department. He also advises he has had renal insufficiency. By labs his renal insufficiency is stable at this point. I will have him resume Norvasc at 5 mg per day. Instructions are provided for follow-up with his family doctor as well as [place] [facility] for evaluation of renal function hypertension.    Charlesetta Shanks, MD 10/08/15 [phone]

## 2015-12-19 ENCOUNTER — Other Ambulatory Visit: Payer: Self-pay | Admitting: Nephrology

## 2015-12-19 DIAGNOSIS — I129 Hypertensive chronic kidney disease with stage 1 through stage 4 chronic kidney disease, or unspecified chronic kidney disease: Secondary | ICD-10-CM

## 2015-12-19 DIAGNOSIS — N183 Chronic kidney disease, stage 3 unspecified: Secondary | ICD-10-CM

## 2015-12-26 ENCOUNTER — Ambulatory Visit
Admission: RE | Admit: 2015-12-26 | Discharge: 2015-12-26 | Disposition: A | Discharge status: Home / Self Care | Payer: Medicaid Other | Source: Ambulatory Visit | Attending: Nephrology | Admitting: Nephrology

## 2015-12-26 DIAGNOSIS — N183 Chronic kidney disease, stage 3 unspecified: Secondary | ICD-10-CM

## 2015-12-26 DIAGNOSIS — I129 Hypertensive chronic kidney disease with stage 1 through stage 4 chronic kidney disease, or unspecified chronic kidney disease: Secondary | ICD-10-CM

## 2016-01-13 LAB — HM DIABETES EYE EXAM

## 2016-04-13 ENCOUNTER — Ambulatory Visit (INDEPENDENT_AMBULATORY_CARE_PROVIDER_SITE_OTHER): Payer: Medicaid Other | Admitting: Student

## 2016-04-13 ENCOUNTER — Encounter: Payer: Self-pay | Admitting: Student

## 2016-04-13 VITALS — BP 148/85 | HR 80 | Temp 98.4°F | Ht 70.5 in | Wt 212.0 lb

## 2016-04-13 DIAGNOSIS — Z0000 Encounter for general adult medical examination without abnormal findings: Secondary | ICD-10-CM

## 2016-04-13 DIAGNOSIS — Z1211 Encounter for screening for malignant neoplasm of colon: Secondary | ICD-10-CM | POA: Insufficient documentation

## 2016-04-13 DIAGNOSIS — E785 Hyperlipidemia, unspecified: Secondary | ICD-10-CM | POA: Diagnosis not present

## 2016-04-13 DIAGNOSIS — Z23 Encounter for immunization: Secondary | ICD-10-CM

## 2016-04-13 DIAGNOSIS — E1310 Other specified diabetes mellitus with ketoacidosis without coma: Secondary | ICD-10-CM | POA: Diagnosis not present

## 2016-04-13 DIAGNOSIS — E1110 Type 2 diabetes mellitus with ketoacidosis without coma: Secondary | ICD-10-CM

## 2016-04-13 HISTORY — DX: Hyperlipidemia, unspecified: E78.5

## 2016-04-13 LAB — POCT GLYCOSYLATED HEMOGLOBIN (HGB A1C): Hemoglobin A1C: 8

## 2016-04-13 MED ORDER — GLIPIZIDE 5 MG PO TABS
5.0000 mg | ORAL_TABLET | Freq: Two times a day (BID) | ORAL | 3 refills | Status: DC
Start: 2016-04-13 — End: 2016-08-09

## 2016-04-13 NOTE — Assessment & Plan Note (Signed)
Lipid panel today. Definitely needs statin. Will address this next visit

## 2016-04-13 NOTE — Progress Notes (Signed)
Subjective:    Patient ID: Anthony Moran, male    DOB: [birth year], 65 y.o.   MRN: [id number]  HPI Patient presents to the clinic to establish care. He has no major issues to address. He was cared by Dr. Beatrix Shipper in the past. He is accompanied by his wife to this visit. He brought his medication bottles to this visit. He has percocet 10/325 mg bottle with 7 tablets. He doesn't take this medicine any more. He likes [place] to dispose for him. I collected the bottle and gave to my CMA (April)  PMH: HTN: on amlodipine 10 mg daily. Reports good compliance  Diabetes: A1c 8.0 today. I don't have prior to compare to. He is not on any medicine. He was in [place] in the past. He doesn't want to continue this. He reports GI issues when he was on this medicine.   CKD-III: followed at [place] [facility]. Last visit today.   Denies history of cardiac disease except HTN  Back surgery: lumbar fusion was done at [facility]. Done by Dr. Arnoldo Morale  Hyperlipidemia: was on Crestor in the past that he no longer takes.    FH:  Heart Disease: denies history of sudden cardiac disease at young age.  Cancer: no family history of colon cancer  Psych/Social Depression: describes his mood as happy EtOH abuse: none Tobacco use: none Drug use: none Work: doesn't work now. Worked as Secondary school teacher at [employer].  Exercise: started yoga recently.  Diet: mostly at home Sexual activity: with his spouse  Screening Colon Cancer Screening: due in 2018  Sleep Apnea Screening: wife states he snores a little but denies day time sleepiness Lung Cancer Screening: no history of smoking Opthalmology Visit: last in 11/2015 Dental Visit: not recently. Recommended dental visit  Immunizations  Influenza: declined today  Zoster: will address about zostavax next visit  Pneumococcal: needs PPSV23.  Review of Systems  Constitutional: Negative for fatigue and fever.  HENT: Positive for postnasal drip. Negative for sore throat,  trouble swallowing and voice change.   Eyes: Negative for visual disturbance.  Respiratory: Negative for apnea, cough, chest tightness and shortness of breath.   Cardiovascular: Negative for chest pain and palpitations.  Gastrointestinal: Negative for abdominal pain, blood in stool, constipation and diarrhea.  Endocrine: Negative for cold intolerance, heat intolerance, polydipsia and polyuria.  Genitourinary: Negative for difficulty urinating, dysuria, hematuria, penile pain and scrotal swelling.  Musculoskeletal: Positive for back pain. Negative for joint swelling.  Neurological: Negative for dizziness, syncope and light-headedness.  Psychiatric/Behavioral: Negative for dysphoric mood, sleep disturbance and suicidal ideas.   Objective:   Physical Exam Vitals:   04/13/16 1432  BP: (!) 148/85  Pulse: 80  Temp: 98.4 F (36.9 C)  TempSrc: Oral  Weight: 212 lb (96.2 kg)  Height: 5' 10.5" (1.791 m)    GEN: appears well, no apparent distress. Head: normocephalic and atraumatic  Eyes: without conjunctival injection, sclera anicteric Ears: normal TM and ear canal,  Nares: no rhinorrhea, congestion or erythema,  Oropharynx: mmm without erythema or exudation, poor dentition HEM: negative for cervical LAD CVS: RRR, normal s1 and s2, no murmurs, no edema RESP: no increased work of breathing, good air movement bilaterally, no crackles or wheeze GI: Bowel sounds present and normal, soft, non-tender,non-distended GU: deferred MSK:no focal tenderness to palpation SKIN: significant for numerous skin tags on his face ENDO: negative for thyromegally NEURO: alert and oriented appropriately, no gross defecits. PSYCH: appropriate mood and affect     Assessment &  Plan:  No problem-specific Assessment & Plan notes found for this encounter.

## 2016-04-13 NOTE — Patient Instructions (Signed)
It was great seeing you today!  1. Diabetes: A1c is 8.0. Goal A1c is less than 7.0. Have started your new medication called glipizide. Take this medication twice a day. I also recommend daily moderate intensity exercise weekly total of 150 minutes per week.   Please come back and see [place] in a month to follow-up on this.    If we did any lab work today, and the results require attention, either me or my nurse will get in touch with you. If everything is normal, you will get a letter in mail. If you don't hear from [place] in two weeks, please give [place] a call. Otherwise, we look forward to seeing you again at your next visit. If you have any questions or concerns before then, please call the clinic at [phone].   Please bring all your medications to every doctors visit   Sign up for My Chart to have easy access to your labs results, and communication with your Primary care physician.     Please check-out at the front desk before leaving the clinic.    Take Care,    Portion Size    Choose healthier foods such as 100% whole grains, vegetables, fruits, beans, nut seeds, olive oil, most vegetable oils, fat-free dietary, wild game and fish.   Avoid sweet tea, other sweetened beverages, soda, fruit juice, cold cereal and milk and trans fat.   Exercise at least 150 minutes per week, including weight resistance exercises 3 or 4 times per week.   Try to lose at least 7-10% of your current body weight.     Exercise Guide:

## 2016-04-13 NOTE — Assessment & Plan Note (Signed)
A1c 8.0 today. I don't have prior to compare to. He is not on any medicine. He was in [place] in the past. He doesn't want to continue this. Agreed to try glipizide. -Glipizide 5 mg twice a day. Discussed about s&s of hypoglycemia and management.  -Follow up on medical records from his renal and previous PCP -Gave handout of diet and exercise (see AVS) -Follow up in one month

## 2016-04-13 NOTE — Assessment & Plan Note (Addendum)
Declined flu shot. Pneumonia, Tdap, Zostavax next visit Awaiting on his medical record and his labs from renal and his previous PCP Will address next visit

## 2016-04-14 LAB — LIPID PANEL
CHOLESTEROL: 196 mg/dL (ref 125–200)
HDL: 53 mg/dL (ref >=40)
LDL Cholesterol: 121 mg/dL (ref <130)
TRIGLYCERIDES: 112 mg/dL (ref <150)
Total CHOL/HDL Ratio: 3.7 Ratio (ref <=5.0)
VLDL: 22 mg/dL (ref <30)

## 2016-04-14 LAB — MICROALBUMIN / CREATININE URINE RATIO
Creatinine, Urine: 460 mg/dL — ABNORMAL HIGH (ref 20–370)
MICROALB UR: 211.2 mg/dL
MICROALB/CREAT RATIO: 459 ug/mg{creat} — AB (ref <30)

## 2016-04-18 ENCOUNTER — Encounter: Payer: Self-pay | Admitting: Student

## 2016-04-25 ENCOUNTER — Telehealth: Payer: Self-pay | Admitting: Student

## 2016-04-25 ENCOUNTER — Encounter: Payer: Self-pay | Admitting: Student

## 2016-04-25 NOTE — Telephone Encounter (Signed)
Attempted to call patient to discuss about his lab results from his recent visit. Patient didn't pick up the phone. His lab results was significant for protein in the urine. He is already being followed by nephrologist. We will address at his at his next visit that can't suspect to be early December based on our discussion at his recent office visit.

## 2016-04-25 NOTE — Telephone Encounter (Signed)
Result letter. Attempted to call patient but no response.

## 2016-05-02 ENCOUNTER — Encounter: Payer: Self-pay | Admitting: Gastroenterology

## 2016-05-09 ENCOUNTER — Telehealth: Payer: Self-pay | Admitting: Student

## 2016-05-09 NOTE — Telephone Encounter (Signed)
Needs new RX for glucometer, strips, lancets ( the whole kit)  [facility] on [address]

## 2016-05-10 NOTE — Telephone Encounter (Signed)
Patient is not on insulin. Doesn't need diabetic supply. Need office visit for follow up per our discussion at his first and last office visit.

## 2016-05-11 NOTE — Telephone Encounter (Signed)
Spoke to pt wife. The medication he is on makes his sugar low. They need to keep up with how his sugar is doing and needs the test kit to do so. Has an appt next Thursday Dec 7th.

## 2016-05-15 NOTE — Telephone Encounter (Signed)
Will discuss this when I see him in office. Thanks!

## 2016-05-17 ENCOUNTER — Encounter: Payer: Self-pay | Admitting: Student

## 2016-05-17 ENCOUNTER — Ambulatory Visit (INDEPENDENT_AMBULATORY_CARE_PROVIDER_SITE_OTHER): Payer: Medicaid Other | Admitting: Student

## 2016-05-17 VITALS — BP 138/78 | HR 66 | Temp 98.1°F | Wt 212.0 lb

## 2016-05-17 DIAGNOSIS — G8929 Other chronic pain: Secondary | ICD-10-CM | POA: Diagnosis not present

## 2016-05-17 DIAGNOSIS — E785 Hyperlipidemia, unspecified: Secondary | ICD-10-CM

## 2016-05-17 DIAGNOSIS — E1310 Other specified diabetes mellitus with ketoacidosis without coma: Secondary | ICD-10-CM | POA: Diagnosis not present

## 2016-05-17 DIAGNOSIS — I10 Essential (primary) hypertension: Secondary | ICD-10-CM | POA: Diagnosis not present

## 2016-05-17 DIAGNOSIS — M545 Low back pain: Secondary | ICD-10-CM | POA: Diagnosis not present

## 2016-05-17 DIAGNOSIS — E1110 Type 2 diabetes mellitus with ketoacidosis without coma: Secondary | ICD-10-CM

## 2016-05-17 MED ORDER — LISINOPRIL 5 MG PO TABS: 5.0000 mg | ORAL_TABLET | Freq: Every day | ORAL | 3 refills | Status: DC

## 2016-05-17 MED ORDER — ASPIRIN EC 81 MG PO TBEC: 81.0000 mg | DELAYED_RELEASE_TABLET | Freq: Every day | ORAL | 11 refills | Status: AC

## 2016-05-17 MED ORDER — ATORVASTATIN CALCIUM 20 MG PO TABS: 20.0000 mg | ORAL_TABLET | Freq: Every day | ORAL | 3 refills | Status: DC

## 2016-05-17 NOTE — Patient Instructions (Addendum)
It was great seeing you today! We have addressed the following issues today  1. Diabetes: I have sent a new prescription called Lisinopril, which is a blood pressure medicine but protect your kidneys from diabetes. I have also sent a cholesterol medicine called atorvastatin and baby aspirin. The later two protects you from having stroke or heart attack. Please come back and see [place] in 2 weeks for follow-up on your blood pressure and kidney since we started new medication.    If we did any lab work today, and the results require attention, either me or my nurse will get in touch with you. If everything is normal, you will get a letter in mail. If you don't hear from [place] in two weeks, please give [place] a call. Otherwise, we look forward to seeing you again at your next visit. If you have any questions or concerns before then, please call the clinic at [phone].   Please bring all your medications to every doctors visit   Sign up for My Chart to have easy access to your labs results, and communication with your Primary care physician.     Please check-out at the front desk before leaving the clinic.    Portion Size    Choose healthier foods such as 100% whole grains, vegetables, fruits, beans, nut seeds, olive oil, most vegetable oils, fat-free dietary, wild game and fish.   Avoid sweet tea, other sweetened beverages, soda, fruit juice, cold cereal and milk and trans fat.   Exercise at least 150 minutes per week, including weight resistance exercises 3 or 4 times per week.   Try to lose at least 7-10% of your current body weight.

## 2016-05-17 NOTE — Progress Notes (Signed)
Subjective:    Patient ID: Anthony Moran is a 65 y.o. old male. Patient here to discuss about his lab results from his previous visit. He also has a concern about his low back pain that he thinks is related to his kidney HPI #Lower back pain: This is a chronic issue going on for 20 years. Back pain is on and off. Pain is burning and sharp in nature. No radiation to legs. He says his lower back pain moves from one side to the other. Pain sometimes  improves with ice. Numbness in big toes sometimes. Intermittent pain in his soles especially after walking. There is no significant progression or recent changes. He is worried that this may be related to his Kidney. He denies saddle anesthesia, fever, unintentional weight loss, urinary or fecal incontinence, dysuria, hematuria or urine volume or stream change.   Off note, patient with DDD, herniated nucleus pulposus and spondylosis at L4-5 and L5-S1 s/p L4-5 and L5-S1 decompression and fusion in 2001. Repeat lumbar MRI in 2015 with L3-4, spondylolisthesis, DDD, spinal stenosis compressing both the L3 and the L4 nerve roots s/p L3 Laminotomy/foraminotomies to decompress the bilateral L3 and L4 nerve roots in 2016.   Renal US on 12/26/2015 with Mildly increased renal cortical echotexture consistent with medical renal disease  Diabetes: improved. Recent A1c 8.0 from 10.1. He reports good compliance with his glipizide. However, he reports few episodes of hypoglycemia to 60's. He is only on 5 mg of glipizide twice a day. He is not on insulin or other diabetic medicine. He is not a candidate for metformin due to his CKD. He likes to have prescriptions for diabetic kits so that he can monitor his blood glucose at home. He says he has seen an eye doctor this year. ([place]) PMH: reviewed  SH: denies smoking, drinking or recreational drug use.   Review of Systems Per HPI Objective:   Vitals:   05/17/16 1349  BP: 138/78  Pulse: 66  Temp: 98.1 F  (36.7 C)  TempSrc: Oral  SpO2: 100%  Weight: 212 lb (96.2 kg)    GEN: appears well, no apparent distress. Oropharynx: mmm without erythema or exudation CVS: RRR, normal s1 and s2, no murmurs, no edema RESP: no increased work of breathing, good air movement bilaterally, no crackles or wheeze GI: Bowel sounds present and normal, soft, non-tender,non-distended GU: no CVA tenderness [id number] to palpation over paraspinal muscles SKIN: no apparent lesion NEURO: alert and oriented appropriately, no gross deficits, motor 5/5 in LE bilaterally, sensation intact in all dermatomes of LE's  PSYCH: appropriate mood and affect      Assessment & Plan:  Chronic back pain Patient with DDD, herniated nucleus pulposus and spondylosis at L4-5 and L5-S1 s/p L4-5 and L5-S1 decompression and fusion in 2001. Repeat lumbar MRI in 2015 with L3-4, spondylolisthesis, DDD, spinal stenosis compressing both the L3 and the L4 nerve roots s/p L3 Laminotomy/foraminotomies to decompress the bilateral L3 and L4 nerve roots in 2016.   No red flags at this time. Patient is not interested in pain medicine at this time. He has been managing his pain with cold ice which is helping. Pain is also intermittent. His main concern is his kidney. He appears a little paranoid about his kidney. He is followed by nephrology.   DM (diabetes mellitus) type 2, uncontrolled, with ketoacidosis ([place]) Improved. His recent A1c 8.0 from 10.1. He reports good compliance with his glipizide. However, he reports few episodes of hypoglycemia to  60's. He is only on 5 mg of glipizide twice a day. He is not on insulin or other diabetic medicine. He is not a candidate for metformin due to his CKD. He likes to have prescriptions for diabetic kits so that he can monitor his blood glucose at home.  -Will continue glipizide at 5 mg twice a day  -Start statin and baby ASA. No history of allergy -start Lisinopril 5 mg daily for renal protection -Ordered  his diabetic supplies -BMP today -Follow up in two weeks. Will repeat BMP and do foot exam at that time.  -Patient already followed by nephrology.

## 2016-05-18 LAB — BASIC METABOLIC PANEL WITH GFR
BUN: 20 mg/dL (ref 7–25)
CHLORIDE: 107 mmol/L (ref 98–110)
CO2: 25 mmol/L (ref 20–31)
Calcium: 9.1 mg/dL (ref 8.6–10.3)
Creat: 2.15 mg/dL — ABNORMAL HIGH (ref 0.70–1.25)
GFR, EST NON AFRICAN AMERICAN: 31 mL/min — AB (ref >=60)
GFR, Est African American: 36 mL/min — ABNORMAL LOW (ref >=60)
Glucose, Bld: 84 mg/dL (ref 65–99)
POTASSIUM: 4.1 mmol/L (ref 3.5–5.3)
SODIUM: 142 mmol/L (ref 135–146)

## 2016-05-18 MED ORDER — GLUCOSE BLOOD VI STRP: ORAL_STRIP | 1 refills | Status: DC

## 2016-05-18 MED ORDER — ACCU-CHEK NANO SMARTVIEW W/DEVICE KIT: PACK | 0 refills | Status: DC

## 2016-05-18 MED ORDER — ACCU-CHEK FASTCLIX LANCETS MISC
1 refills | Status: DC
Start: 2016-05-18 — End: 2016-11-08

## 2016-05-18 NOTE — Assessment & Plan Note (Addendum)
Improved. His recent A1c 8.0 from 10.1. He reports good compliance with his glipizide. However, he reports few episodes of hypoglycemia to 60's. He is only on 5 mg of glipizide twice a day. He is not on insulin or other diabetic medicine. He is not a candidate for metformin due to his CKD. He likes to have prescriptions for diabetic kits so that he can monitor his blood glucose at home.  -Will continue glipizide at 5 mg twice a day  -Start statin and baby ASA. No history of allergy -start Lisinopril 5 mg daily for renal protection -Ordered his diabetic supplies -BMP today -Follow up in two weeks. Will repeat BMP and do foot exam at that time.  -Patient already followed by nephrology.

## 2016-05-18 NOTE — Assessment & Plan Note (Addendum)
Patient with DDD, herniated nucleus pulposus and spondylosis at L4-5 and L5-S1 s/p L4-5 and L5-S1 decompression and fusion in 2001. Repeat lumbar MRI in 2015 with L3-4, spondylolisthesis, DDD, spinal stenosis compressing both the L3 and the L4 nerve roots s/p L3 Laminotomy/foraminotomies to decompress the bilateral L3 and L4 nerve roots in 2016.   No red flags at this time. Patient is not interested in pain medicine at this time. He has been managing his pain with cold ice which is helping. Pain is also intermittent. His main concern is his kidney. He appears a little paranoid about his kidney. He is followed by nephrology.

## 2016-05-24 IMAGING — MR MR LUMBAR SPINE W/O CM
4 of 5 series · 18 of 48 positions shown · non-contrast
Comparison: 01/03/2010

CLINICAL DATA: Prior lumbar fusion.  Right-sided back pain.

EXAM:
MRI LUMBAR SPINE WITHOUT CONTRAST
TECHNIQUE: Multiplanar, multisequence MR imaging of the lumbar spine was
performed. No intravenous contrast was administered.

[Series 3: T1 · sagittal · 4.0mm · 0.55mm/px · 3 of 12 slices shown (1 of 2)]
[im 3/12]
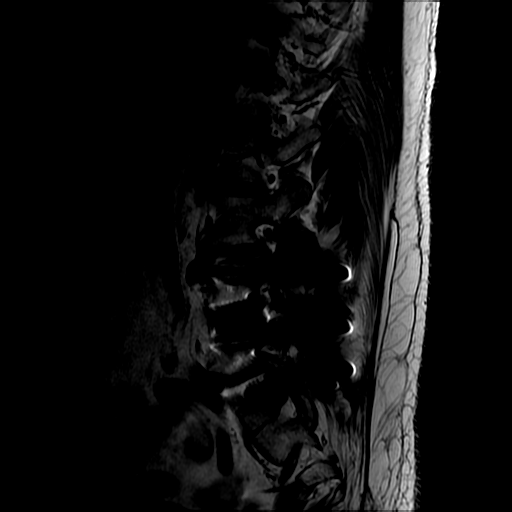
[im 7/12]
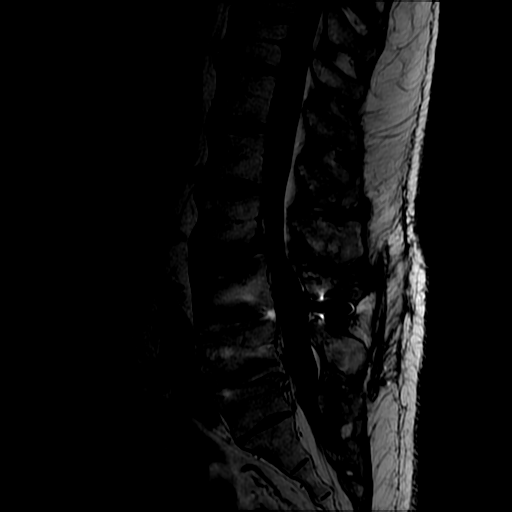
[im 12/12]
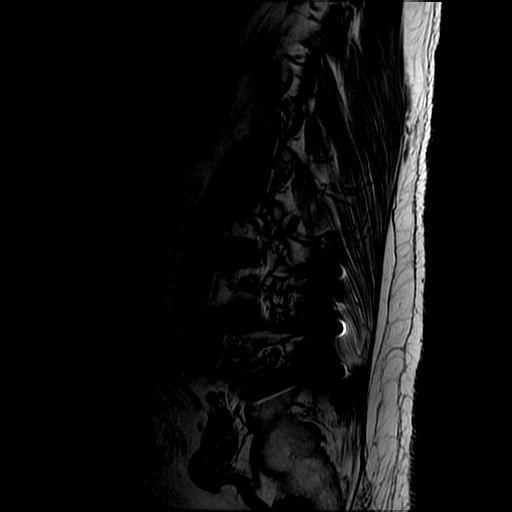

[Series 4: T2 · sagittal · 4.0mm · 0.55mm/px · 5 of 12 slices shown (1 of 2)]
[im 1/12]
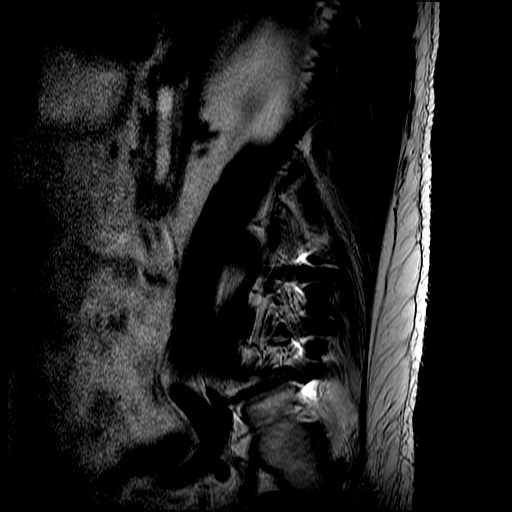
[im 3/12]
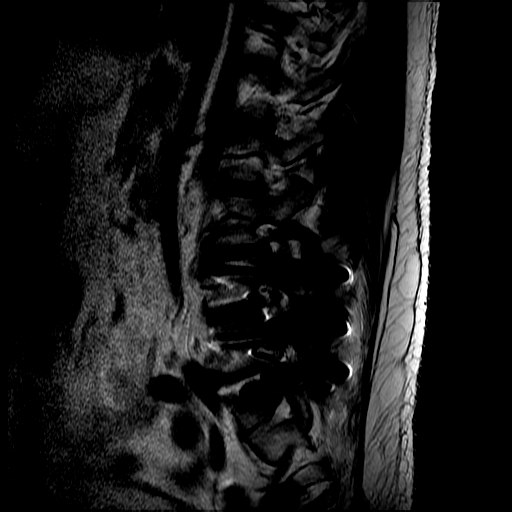
[im 6/12]
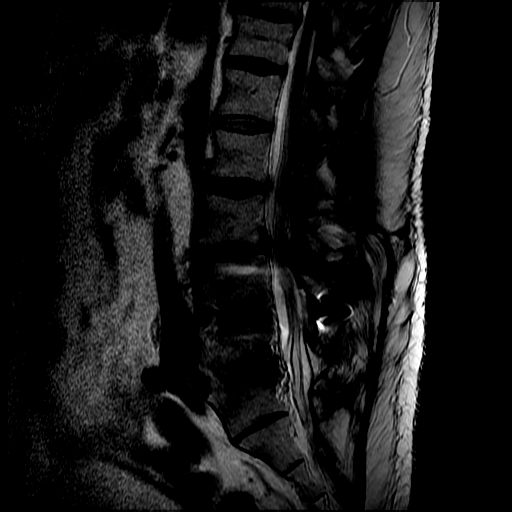
[im 9/12]
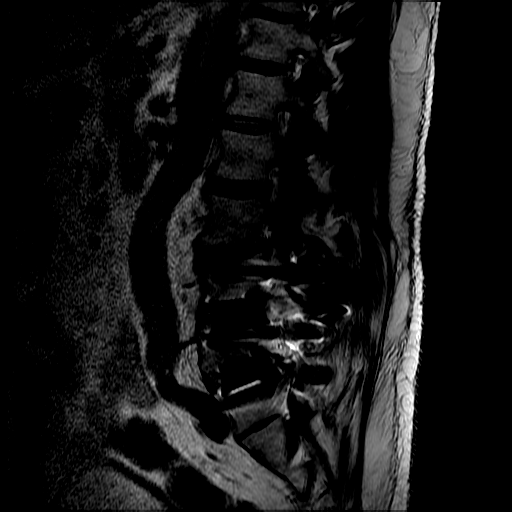
[im 12/12]
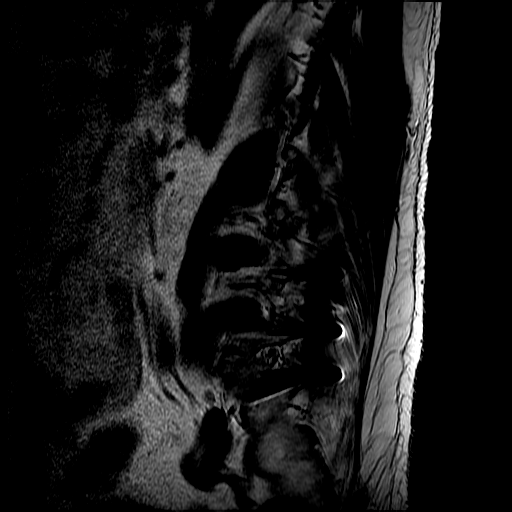

[Series 6: T2 · axial · 4.0mm · 0.39mm/px · z∈[-85,+57]mm · 7 of 37 slices shown (2 of 2)]
[im 3/37]
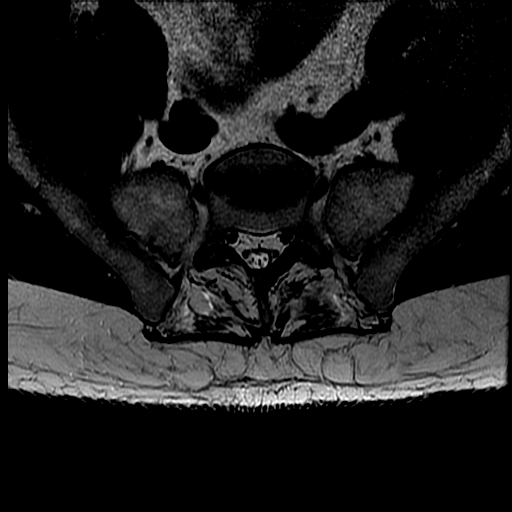
[im 5/37]
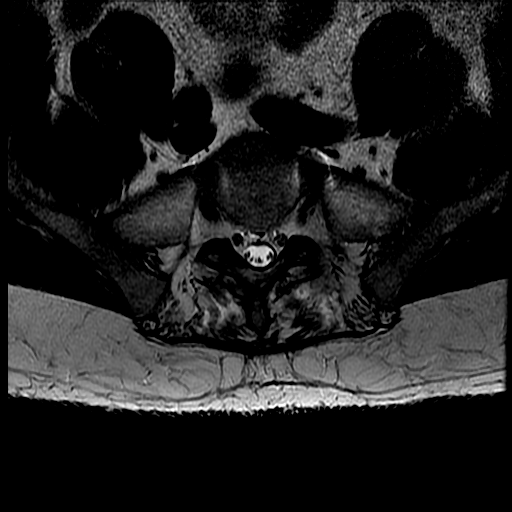
[im 8/37]
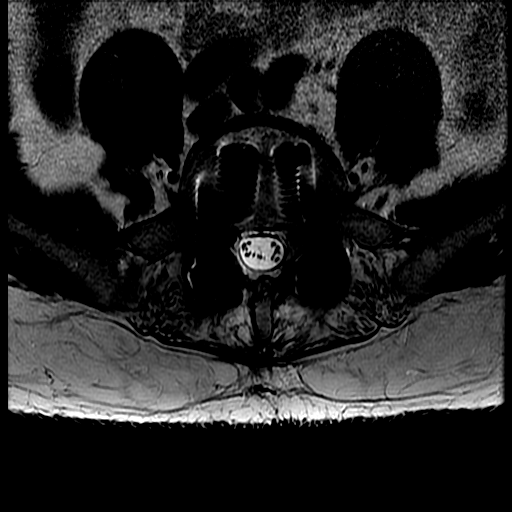
[im 13/37]
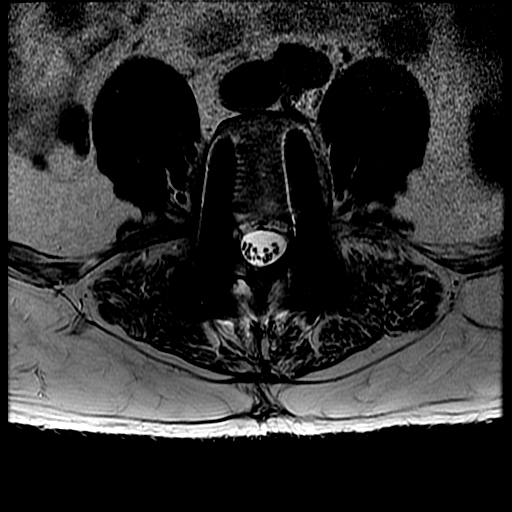
[im 17/37]
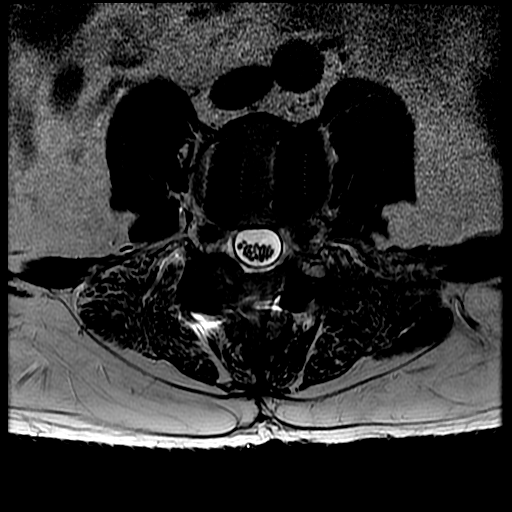
[im 20/37]
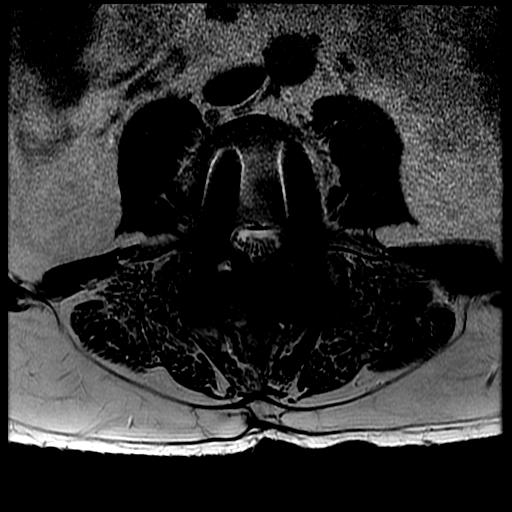
[im 32/37]
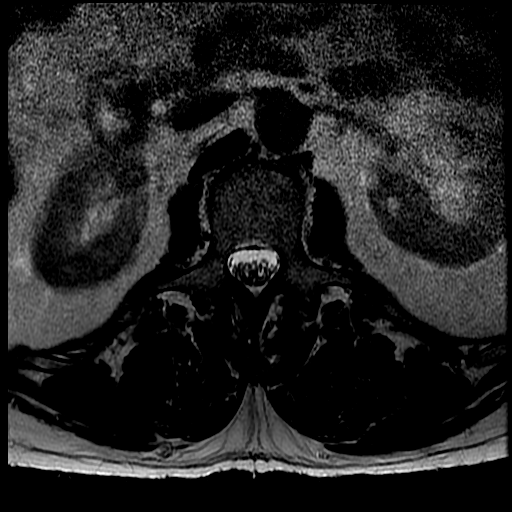

[Series 7: T1 · axial · 4.0mm · 0.39mm/px · z∈[-75,+57]mm · 3 of 37 slices shown (2 of 2)]
[im 5/37]
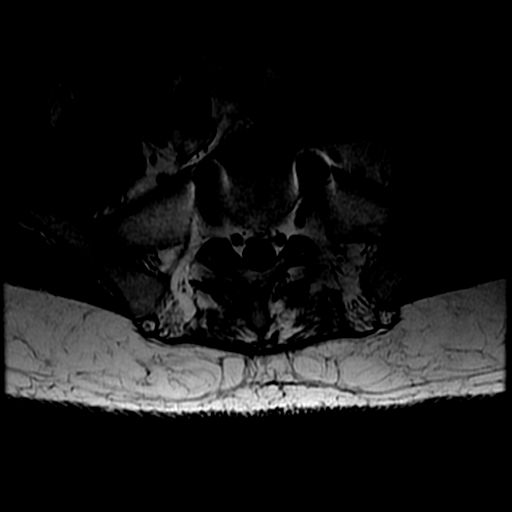
[im 20/37]
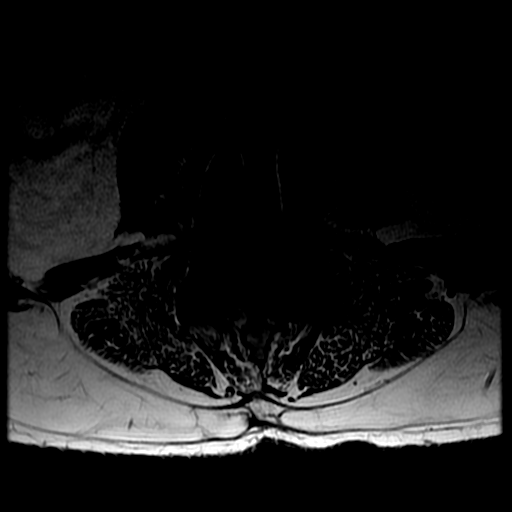
[im 32/37]
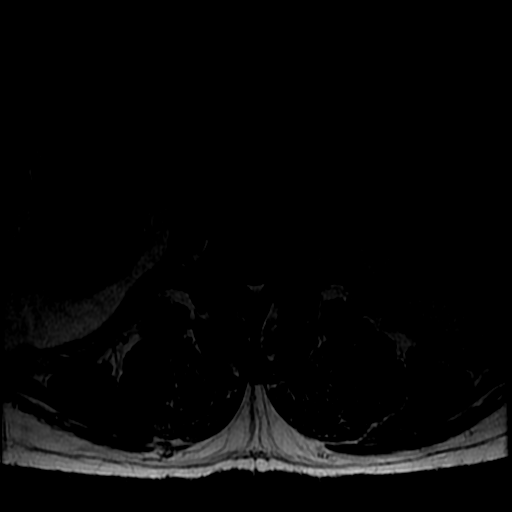

[18 of 48 positions shown; findings below may reference images not displayed]

FINDINGS: The vertebral bodies of the lumbar spine are normal in size. There
is 2 mm of anterolisthesis of L3 on L4 secondary to facet disease.
There is normal bone marrow signal demonstrated throughout the
vertebra. There is posterior lumbar interbody fusion from L4 through
S1 with bilateral pedicle screws present. There is degenerative disc
disease at L3-4.

The spinal cord is normal in signal and contour. The cord terminates
normally at L1 . The nerve roots of the cauda equina and the filum
terminale are normal.

The visualized portions of the SI joints are unremarkable.

The imaged intra-abdominal contents are unremarkable.

T12-L1: No significant disc bulge. No evidence of neural foraminal
stenosis. No central canal stenosis.

L1-L2: No significant disc bulge. No evidence of neural foraminal
stenosis. No central canal stenosis.

L2-L3: Mild broad-based disc bulge. Mild bilateral facet
arthropathy. No evidence of neural foraminal stenosis. No central
canal stenosis.

L3-L4: Mild broad-based disc bulge. Moderate bilateral facet
arthropathy with ligamentum flavum infolding resulting in mild
spinal stenosis. No evidence of neural foraminal stenosis.

L4-L5: Interbody fusion. No evidence of neural foraminal stenosis.
No central canal stenosis.

L5-S1: Interbody fusion. No evidence of neural foraminal stenosis.
No central canal stenosis.
IMPRESSION: 1. Posterior lumbar fusion at L4 through S1 without recurrent
foraminal or central canal stenosis.
2. At L3-4 there is a mild broad-based disc bulge with moderate
bilateral facet arthropathy and ligamentum flavum infolding
resulting in mild spinal stenosis.

## 2016-06-01 ENCOUNTER — Ambulatory Visit (INDEPENDENT_AMBULATORY_CARE_PROVIDER_SITE_OTHER): Payer: Medicaid Other | Admitting: Family Medicine

## 2016-06-01 ENCOUNTER — Encounter: Payer: Self-pay | Admitting: Family Medicine

## 2016-06-01 VITALS — BP 130/72 | HR 69 | Temp 98.7°F | Wt 213.0 lb

## 2016-06-01 DIAGNOSIS — Z0000 Encounter for general adult medical examination without abnormal findings: Secondary | ICD-10-CM | POA: Diagnosis not present

## 2016-06-01 DIAGNOSIS — E1310 Other specified diabetes mellitus with ketoacidosis without coma: Secondary | ICD-10-CM | POA: Diagnosis not present

## 2016-06-01 DIAGNOSIS — E1110 Type 2 diabetes mellitus with ketoacidosis without coma: Secondary | ICD-10-CM

## 2016-06-01 LAB — BASIC METABOLIC PANEL WITH GFR
BUN: 22 mg/dL (ref 7–25)
CHLORIDE: 107 mmol/L (ref 98–110)
CO2: 25 mmol/L (ref 20–31)
Calcium: 9 mg/dL (ref 8.6–10.3)
Creat: 2.04 mg/dL — ABNORMAL HIGH (ref 0.70–1.25)
GFR, EST AFRICAN AMERICAN: 39 mL/min — AB (ref >=60)
GFR, EST NON AFRICAN AMERICAN: 33 mL/min — AB (ref >=60)
Glucose, Bld: 81 mg/dL (ref 65–99)
POTASSIUM: 3.9 mmol/L (ref 3.5–5.3)
Sodium: 142 mmol/L (ref 135–146)

## 2016-06-01 NOTE — Progress Notes (Signed)
   Subjective:    Patient ID: Anthony Moran, male    DOB: March 01, 1951, 65 y.o.   MRN: [id number]   CC: New medication follow and repeat lab work  HPI: Reford Norgaard is a 65 yo male here today to follow on new medications regimen and repeat lab work after being started on atorvastatin, baby ASA, lisinopril 5 mg. Patient reports that he has been taking medication as prescribed with no side effects. Patient reports one instance of burning with urination which lasted day but which has self resolved. Patient denies any abdominal pain, myalgias, headaches, dizziness, shortness of breath or chest pain.  Smoking status reviewed   ROS: all other systems were reviewed and are negative other than in the HPI   Past medical history, surgical, family, and social history reviewed and updated in the EMR as appropriate.  Objective:  BP 130/72   Pulse 69   Temp 98.7 F (37.1 C) (Oral)   Wt 213 lb (96.6 kg)   SpO2 99%   BMI 30.13 kg/m   Vitals and nursing note reviewed  General: NAD, pleasant, able to participate in exam Cardiac: RRR, normal heart sounds, no murmurs. 2+ radial and PT pulses bilaterally Respiratory: CTAB, normal effort, No wheezes, rales or rhonchi Abdomen: soft, nontender, nondistended, no hepatic or splenomegaly, +BS Extremities: no edema or cyanosis. WWP. Diabetic foot exam performed sensation intact on dorsal and plantar aspect of the foot bilaterally with L>R  Skin: warm and dry, no rashes noted Neuro: alert and oriented x4, no focal deficits Psych: Normal affect and mood   Assessment & Plan:   #Follow up visit after medication initiation and repeat lab work No side effect from new medication regimen. Diabetic foot exam has been perfomed and is normal with slight decrease in sensation greater on the right foot in comparison to the left. --Will order BMP, HIV, and HCV --Patient will follow up with PCP early next year for results interpretation and medications titration  as needed.    Marjie Skiff, MD Family Medicine Resident PGY-1

## 2016-06-01 NOTE — Patient Instructions (Signed)
It was great seeing you today! We have addressed the following issues today  1. Today we talk about your medications that you just started two weeks ago. We will do repeat blood work as Dr. Cyndia Skeeters wanted. 2. We will test you for HIV, Hep C, BMP. 3. Your foot exam was normal, but you can mention to Dr.Gonfa your nail .     If we did any lab work today, and the results require attention, either me or my nurse will get in touch with you. If everything is normal, you will get a letter in mail. If you don't hear from [place] in two weeks, please give [place] a call. Otherwise, we look forward to seeing you again at your next visit. If you have any questions or concerns before then, please call the clinic at [phone].   Please bring all your medications to every doctors visit   Sign up for My Chart to have easy access to your labs results, and communication with your Primary care physician.     Please check-out at the front desk before leaving the clinic.    Take Care,

## 2016-06-02 LAB — HIV ANTIBODY (ROUTINE TESTING W REFLEX): HIV 1&2 Ab, 4th Generation: NONREACTIVE

## 2016-06-02 LAB — HEPATITIS C ANTIBODY: HCV AB: NEGATIVE

## 2016-06-08 ENCOUNTER — Telehealth: Payer: Self-pay | Admitting: Student

## 2016-06-08 NOTE — Telephone Encounter (Signed)
-----   Message from Mercy Riding, MD sent at 05/18/2016  8:00 PM EST ----- Regarding: Eye Exam Result [facility] in [place]. Thanks! Bretta Bang

## 2016-06-08 NOTE — Telephone Encounter (Signed)
I have requested a dm eye exam report from [facility].Their office will fax it to [place].   Hart

## 2016-06-25 ENCOUNTER — Ambulatory Visit (INDEPENDENT_AMBULATORY_CARE_PROVIDER_SITE_OTHER): Payer: Medicaid Other | Admitting: Student

## 2016-06-25 ENCOUNTER — Encounter: Payer: Self-pay | Admitting: Student

## 2016-06-25 VITALS — BP 130/88 | HR 71 | Temp 98.2°F | Ht 71.0 in | Wt 211.8 lb

## 2016-06-25 DIAGNOSIS — I10 Essential (primary) hypertension: Secondary | ICD-10-CM

## 2016-06-25 DIAGNOSIS — E785 Hyperlipidemia, unspecified: Secondary | ICD-10-CM

## 2016-06-25 DIAGNOSIS — J309 Allergic rhinitis, unspecified: Secondary | ICD-10-CM | POA: Insufficient documentation

## 2016-06-25 DIAGNOSIS — J302 Other seasonal allergic rhinitis: Secondary | ICD-10-CM

## 2016-06-25 MED ORDER — FLUTICASONE PROPIONATE 50 MCG/ACT NA SUSP: 2.0000 | Freq: Every day | NASAL | 6 refills | Status: AC

## 2016-06-25 MED ORDER — ATORVASTATIN CALCIUM 40 MG PO TABS: 40.0000 mg | ORAL_TABLET | Freq: Every day | ORAL | 2 refills | Status: DC

## 2016-06-25 MED ORDER — ZOSTER VACCINE LIVE 19400 UNT/0.65ML ~~LOC~~ SUSR: 0.6500 mL | Freq: Once | SUBCUTANEOUS | 0 refills | Status: AC

## 2016-06-25 NOTE — Progress Notes (Signed)
  Subjective:    Anthony Moran is a 66 y.o. old male here for follow-up on hypertension and hyperlipidemia  HPI Hyperlipidemia: ASCVD risk score 28%. Started on Lipitor 20 mg and ASA 81 mg daily about a month ago.  Tolearating his lipitor well. Denies side effects.    Hypertension: On amlodipine 10 mg daily. Started on lisinopril 5 mg daily for renal protection given history of diabetes and CKD. Tolerating the medication well. Reports good compliance  Runny nose and congestion: for two to three months months, off and on. No recent changes. Reports frequent tearing. Denies itching, fever, shortness of breath, chest pain, sore throat. However, he reports dry cough and frequent postnasal dripping. Denies symptoms of reflux. Wife works in the school system but doesn't have symptoms.   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   has a past medical history of Anxiety; Arthritis; Asthma; Cervical spondylosis without myelopathy; Depression; Diabetes mellitus; Hypertension; Lumbar post-laminectomy syndrome; Lumbosacral neuritis; Mononeuritis multiplex; Pneumonia; and Spinal stenosis in cervical region.  FH:  Family History  Problem Relation Age of Onset  . Breast cancer Maternal Aunt   . Prostate cancer Father   . Hypertension Sister     [facility] Social History  Substance Use Topics  . Smoking status: Never Smoker  . Smokeless tobacco: Never Used  . Alcohol use No     Comment: rare    Immunizations needed: Zostavax. Hard Tdap recently  Review of Systems Per history of present illness    Objective:     Vitals:   06/25/16 1400  BP: 130/88  Pulse: 71  Temp: 98.2 F (36.8 C)    TempSrc: Oral  SpO2: 96%  Weight: 211 lb 12.8 oz (96.1 kg)  Height: 5\' 11"  (1.803 m)    Physical Exam GEN: appears well, no apparent distress Eyes: conjunctiva without injection, sclera anicteric Ears: external ear and ear canal normal Nares: some rhinorrhea and congestion and significant erythema of the nasal turbinates bilaterally Oropharynx: mmm without erythema or exudation HEM: negative for cervical or periauricular lymphadenopathies CVS: RRR, nl S1&S2, no murmurs, trace edema RESP: speaks in full sentence, no IWOB, CTAB MSK: no focal tenderness or notable swelling SKIN: no apparent skin lesion except for some skin tags on his face NEURO: alert and oiented appropriately, no gross defecits  PSYCH: euthymic mood with congruent affect    Assessment and Plan:  Hyperlipidemia ASCVD risk score of 28%. Increased Lipitor from 20 mg to 40 mg daily.  We'll continue baby aspirin  HTN (hypertension) Well controlled. Reports some discomfort in his throat and cough which is likely due to postnasal dripping versus lisinopril. If no improvement in his symptoms with Flonase nasal spray in the next couple of weeks, we may change lisinopril to losartan.  -We'll continue amlodipine 10 mg and lisinopril 5 mg daily for now  Allergic rhinitis Runny nose and congestion, frequent eye tearing, postnasal drip and intermittent cough concerning for allergic rhinitis. Exam remarkable for significant erythema of nasal turbinates. Cardiopulmonary exam was in normal limits.  -Gave prescription for Flonase nasal spray and discussed about appropriate use -Suggested using humidifier  Return in about 3 months (around 09/23/2016) for DM and HTN.  Mercy Riding, MD 06/25/16 Pager: [phone]

## 2016-06-25 NOTE — Assessment & Plan Note (Addendum)
Well controlled. Reports some discomfort in his throat and cough which is likely due to postnasal dripping versus lisinopril. If no improvement in his symptoms with Flonase nasal spray in the next couple of weeks, we may change lisinopril to losartan.  -We'll continue amlodipine 10 mg and lisinopril 5 mg daily for now

## 2016-06-25 NOTE — Patient Instructions (Signed)
It was great seeing you today! We have addressed the following issues today  1. Cholesterol: I have increased your atorvastatin to 40 mg daily. You can take 2 of the 20 mg tablets, then pick the new prescription 2.   Runny nose/congestion: this is likely allergy. I have sent a prescription for Flonase to your pharmacy. I also recommend using humidifiers as the air gets dry during the winter season.  If we did any lab work today, and the results require attention, either me or my nurse will get in touch with you. If everything is normal, you will get a letter in mail and a message via . If you don't hear from [place] in two weeks, please give [place] a call. Otherwise, we look forward to seeing you again at your next visit. If you have any questions or concerns before then, please call the clinic at [phone].  Please bring all your medications to every doctors visit  Sign up for My Chart to have easy access to your labs results, and communication with your Primary care physician.    Please check-out at the front desk before leaving the clinic.    Take Care,   Dr. Cyndia Skeeters

## 2016-06-25 NOTE — Assessment & Plan Note (Addendum)
ASCVD risk score of 28%. Increased Lipitor from 20 mg to 40 mg daily.  We'll continue baby aspirin

## 2016-06-25 NOTE — Addendum Note (Signed)
Addended by: Katharina Caper, Yale Golla D on: 06/25/2016 03:06 PM   Modules accepted: Orders, SmartSet

## 2016-06-25 NOTE — Assessment & Plan Note (Signed)
Runny nose and congestion, frequent eye tearing, postnasal drip and intermittent cough concerning for allergic rhinitis. Exam remarkable for significant erythema of nasal turbinates. Cardiopulmonary exam was in normal limits.  -Gave prescription for Flonase nasal spray and discussed about appropriate use -Suggested using humidifier

## 2016-07-05 ENCOUNTER — Telehealth: Payer: Self-pay | Admitting: Student

## 2016-07-05 NOTE — Telephone Encounter (Signed)
Patient's wife stated needs refill Lipitor from 20 mg to 40 mg daily

## 2016-08-09 ENCOUNTER — Other Ambulatory Visit: Payer: Self-pay | Admitting: Student

## 2016-08-09 DIAGNOSIS — E1110 Type 2 diabetes mellitus with ketoacidosis without coma: Secondary | ICD-10-CM

## 2016-10-08 DIAGNOSIS — N183 Chronic kidney disease, stage 3 (moderate): Secondary | ICD-10-CM | POA: Diagnosis not present

## 2016-10-22 ENCOUNTER — Emergency Department (HOSPITAL_COMMUNITY): Payer: Self-pay

## 2016-10-22 ENCOUNTER — Emergency Department (HOSPITAL_COMMUNITY)
Admission: EM | Admit: 2016-10-22 | Discharge: 2016-10-22 | Disposition: A | Discharge status: Home / Self Care | Payer: Self-pay | Attending: Emergency Medicine | Admitting: Emergency Medicine

## 2016-10-22 DIAGNOSIS — M545 Low back pain, unspecified: Secondary | ICD-10-CM

## 2016-10-22 DIAGNOSIS — N189 Chronic kidney disease, unspecified: Secondary | ICD-10-CM | POA: Insufficient documentation

## 2016-10-22 DIAGNOSIS — I129 Hypertensive chronic kidney disease with stage 1 through stage 4 chronic kidney disease, or unspecified chronic kidney disease: Secondary | ICD-10-CM | POA: Insufficient documentation

## 2016-10-22 DIAGNOSIS — E1122 Type 2 diabetes mellitus with diabetic chronic kidney disease: Secondary | ICD-10-CM | POA: Insufficient documentation

## 2016-10-22 DIAGNOSIS — J45909 Unspecified asthma, uncomplicated: Secondary | ICD-10-CM | POA: Insufficient documentation

## 2016-10-22 DIAGNOSIS — Z79899 Other long term (current) drug therapy: Secondary | ICD-10-CM | POA: Insufficient documentation

## 2016-10-22 DIAGNOSIS — G8929 Other chronic pain: Secondary | ICD-10-CM | POA: Insufficient documentation

## 2016-10-22 DIAGNOSIS — Z7984 Long term (current) use of oral hypoglycemic drugs: Secondary | ICD-10-CM | POA: Insufficient documentation

## 2016-10-22 DIAGNOSIS — Z7982 Long term (current) use of aspirin: Secondary | ICD-10-CM | POA: Insufficient documentation

## 2016-10-22 MED ORDER — MORPHINE SULFATE (PF) 4 MG/ML IV SOLN
4.0000 mg | Freq: Once | INTRAVENOUS | Status: AC
  Administered 2016-10-22: 4 mg via INTRAMUSCULAR
  Filled 2016-10-22: qty 1

## 2016-10-22 MED ORDER — LIDOCAINE 5 % EX PTCH
1.0000 | MEDICATED_PATCH | CUTANEOUS | Status: DC
  Administered 2016-10-22: 1 via TRANSDERMAL
  Filled 2016-10-22: qty 1

## 2016-10-22 MED ORDER — LIDOCAINE 5 % EX PTCH: 1.0000 | MEDICATED_PATCH | CUTANEOUS | 0 refills | Status: DC

## 2016-10-22 NOTE — Discharge Instructions (Signed)
Medications: Lidoderm patch  Treatment: Apply Lidoderm patch and discard every 12 hours. Attempt the back exercises stretches as tolerated. Use a heating pad 3-4 times daily alternating 20 minutes on, 20 minutes off.  Follow-up: Please follow-up with your primary care provider and/or neurosurgeon for further evaluation and treatment of your back pain. Please return to emergency department if you develop any new or worsening symptoms including loss of bowel or bladder control, complete numbness in your groin, or inability to walk.

## 2016-10-22 NOTE — ED Triage Notes (Signed)
Patient c/o lower back pain mainly on right side x 3 weeks. Patient reports had back surgeries before on lumbar area.  Patient reports that he cant sit or stand for too long periods at a time. Patient denies any urinary problems

## 2016-10-22 NOTE — ED Provider Notes (Signed)
[facility] DEPT Provider Note   CSN: [id number] Arrival date & time: 10/22/16  1103   By signing my name below, I, Soijett Blue, attest that this documentation has been prepared under the direction and in the presence of Eliezer Mccoy, PA-C Electronically Signed: Soijett Blue, ED Scribe. 10/22/16. 11:49 AM.  History   Chief Complaint Chief Complaint  Patient presents with  . Back Pain    lower    HPI Anthony Moran is a 66 y.o. male with a PMHx of chronic back pain, CKD, DM, HTN, who presents to the Emergency Department complaining of intermittent, non-radiating, right lower back pain onset 3 weeks worsening 1 week ago. Pt right lower back pain is worsened with ambulation. Pt has tried Rx flexeril with no relief of his symptoms. He states that he has had 2 lumbar fusions in the past and that he used to take a lot of opoid medications but hasn't recently due to kidney issues. Pt last back surgery was January 2016. Pt denies neck pain, numbness, tingling, bowel/bladder incontinence, saddle anesthesia, fever, weight loss, difficulty urinating, recent injury, recent trauma, and any other symptoms. Denies PMHx of CA.   The history is provided by the patient. No language interpreter was used.    Past Medical History:  Diagnosis Date  . Anxiety   . Arthritis   . Asthma   . Cervical spondylosis without myelopathy   . Depression   . Diabetes mellitus   . Hypertension   . Lumbar post-laminectomy syndrome   . Lumbosacral neuritis   . Mononeuritis multiplex   . Pneumonia   . Spinal stenosis in cervical region     Patient Active Problem List   Diagnosis Date Noted  . Allergic rhinitis 06/25/2016  . Hyperlipidemia 04/13/2016  . Routine adult health maintenance 04/13/2016  . Orthostatic hypotension 09/12/2015  . Chronic back pain 09/12/2015  . DM (diabetes mellitus) type 2, uncontrolled, with ketoacidosis ([place]) 09/12/2015  . BPH (benign prostatic hyperplasia) 09/12/2015  .  Cervical stenosis of spinal canal 10/29/2011  . 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 HTN (hypertension) 06/22/2011    Past Surgical History:  Procedure Laterality Date  . LUMBAR FUSION     L 4-5, L5 si       Home Medications    Prior to Admission medications   Medication Sig Start Date End Date Taking? Authorizing Provider  ACCU-CHEK FASTCLIX LANCETS MISC Use as directed 05/18/16   Mercy Riding, MD  amLODipine (NORVASC) 10 MG tablet Take 0.5 tablets (5 mg total) by mouth daily. 09/13/15   Ghimire, Henreitta Leber, MD  aspirin EC 81 MG tablet Take 1 tablet (81 mg total) by mouth daily. 05/17/16   Mercy Riding, MD  atorvastatin (LIPITOR) 40 MG tablet Take 1 tablet (40 mg total) by mouth daily. 06/25/16   Mercy Riding, MD  Blood Glucose Monitoring Suppl (ACCU-CHEK NANO SMARTVIEW) w/Device KIT Use to check you blood glucose if you have symptoms of low blood sugar 05/18/16   Mercy Riding, MD  calcitRIOL (ROCALTROL) 0.25 MCG capsule Take 0.25 mcg by mouth daily. 03/23/16   [provider]  clobetasol cream (TEMOVATE) 6.76 % Apply 1 application topically 2 (two) times daily as needed (rash).     Lucianne Lei, MD  clotrimazole-betamethasone (LOTRISONE) cream Apply 1 application topically 2 (two) times daily as needed.     Lucianne Lei, MD  fluticasone [facility]) 50 MCG/ACT nasal spray Place 2 sprays into both nostrils daily. 06/25/16  Mercy Riding, MD  glipiZIDE (GLUCOTROL) 5 MG tablet TAKE 1 TABLET (5 MG TOTAL) BY MOUTH 2 (TWO) TIMES DAILY BEFORE A MEAL. 08/09/16   Wendee Beavers T, MD  glucose blood (ACCU-CHEK SMARTVIEW) test strip Use as instructed 05/18/16   Wendee Beavers T, MD  lidocaine (LIDODERM) 5 % Place 1 patch onto the skin daily. Remove & Discard patch within 12 hours or as directed by MD 10/22/16   Lanny Cramp, Bea Graff, PA-C  lisinopril (PRINIVIL,ZESTRIL) 5 MG tablet Take 1 tablet (5 mg total) by mouth daily. 05/17/16   Mercy Riding, MD  Tamsulosin HCl (FLOMAX) 0.4 MG CAPS  Take 0.4 mg by mouth at bedtime.    [provider]    Family History Family History  Problem Relation Age of Onset  . Breast cancer Maternal Aunt   . Prostate cancer Father   . Hypertension Sister     Social History Social History  Substance Use Topics  . Smoking status: Never Smoker  . Smokeless tobacco: Never Used  . Alcohol use No     Comment: rare     Allergies   Isosorb dinitrate-hydralazine   Review of Systems Review of Systems  Constitutional: Negative for fever and unexpected weight change.  Gastrointestinal:       No bowel incontinence.   Genitourinary: Negative for difficulty urinating.       No bladder incontinence.   Musculoskeletal: Positive for back pain (right lower).     Physical Exam Updated Vital Signs BP (!) 143/86 (BP Location: Left Arm)   Pulse 93   Temp 98.2 F (36.8 C) (Oral)   Resp 17   SpO2 100%   Physical Exam  Constitutional: He appears well-developed and well-nourished. No distress.  HENT:  Head: Normocephalic and atraumatic.  Mouth/Throat: Oropharynx is clear and moist. No oropharyngeal exudate.  Eyes: Conjunctivae are normal. Pupils are equal, round, and reactive to light. Right eye exhibits no discharge. Left eye exhibits no discharge. No scleral icterus.  Neck: Normal range of motion. Neck supple. No thyromegaly present.  Cardiovascular: Normal rate, regular rhythm, normal heart sounds and intact distal pulses.  Exam reveals no gallop and no friction rub.   No murmur heard. Pulmonary/Chest: Effort normal and breath sounds normal. No stridor. No respiratory distress. He has no wheezes. He has no rales.  Abdominal: Soft. Bowel sounds are normal. He exhibits no distension. There is no tenderness. There is no rebound and no guarding.  Musculoskeletal: He exhibits no edema.       Lumbar back: He exhibits tenderness. He exhibits no bony tenderness.  Right sided lumbar tenderness. No midline spinal tenderness.     Lymphadenopathy:    He has no cervical adenopathy.  Neurological: He is alert. Coordination normal.  Normal sensation and 5/5 strength to lower extremities; 2+ patellar reflexes  Skin: Skin is warm and dry. No rash noted. He is not diaphoretic. No pallor.  Psychiatric: He has a normal mood and affect.  Nursing note and vitals reviewed.    ED Treatments / Results  DIAGNOSTIC STUDIES: Oxygen Saturation is 100% on RA, nl by my interpretation.    COORDINATION OF CARE: 11:43 AM Discussed treatment plan with pt at bedside which includes lumbar xray and pt agreed to plan.   Radiology Dg Lumbar Spine Complete  Result Date: 10/22/2016 CLINICAL DATA:  Back pain for 3 weeks, initial encounter EXAM: LUMBAR SPINE - COMPLETE 4+ VIEW COMPARISON:  05/10/2015 FINDINGS: Pedicle screws and interbody fusion are again identified  and stable. No hardware failure is noted. The overall appearance is similar to that seen on the prior exam. No new focal abnormality is seen. The overlying soft tissues are within normal limits. IMPRESSION: Multilevel lumbar fusion stable from prior exam. Electronically Signed   By: Inez Catalina M.D.   On: 10/22/2016 12:28    Procedures Procedures (including critical care time)  Medications Ordered in ED Medications  morphine 4 MG/ML injection 4 mg (4 mg Intramuscular Given 10/22/16 1204)     Initial Impression / Assessment and Plan / ED Course  I have reviewed the triage vital signs and the nursing notes.  Pertinent imaging results that were available during my care of the patient were reviewed by me and considered in my medical decision making (see chart for details).      Patient with back pain.  No neurological deficits and normal neuro exam.  Patient is ambulatory. No loss of bowel or bladder control.  No concern for cauda equina.  No fever, night sweats, weight loss, h/o cancer, IVDA, no recent procedure to back. No urinary symptoms suggestive of UTI. Lumbar spine  xray negative for any acute fracture, stable multilevel lumbar fusion. Pt given lidoderm patch and morphine injection while in the ED. Patient's symptoms improved. Pt will be discharged home with lidoderm patch Rx and informed to follow up with PCP or surgeon if symptoms persist. Supportive care and return precaution discussed. Patient vitals stable throughout ED course and discharged in satisfactory condition.  Final Clinical Impressions(s) / ED Diagnoses   Final diagnoses:  Chronic right-sided low back pain without sciatica    New Prescriptions Discharge Medication List as of 10/22/2016 12:40 PM    START taking these medications   Details  lidocaine (LIDODERM) 5 % Place 1 patch onto the skin daily. Remove & Discard patch within 12 hours or as directed by MD, Starting Mon 10/22/2016, Print       I personally performed the services described in this documentation, which was scribed in my presence. The recorded information has been reviewed and is accurate.     Frederica Kuster, PA-C 10/22/16 1936    Margette Fast, MD 10/22/16 2000

## 2016-11-08 ENCOUNTER — Encounter: Payer: Self-pay | Admitting: Student

## 2016-11-08 ENCOUNTER — Ambulatory Visit (INDEPENDENT_AMBULATORY_CARE_PROVIDER_SITE_OTHER): Payer: Self-pay | Admitting: Student

## 2016-11-08 VITALS — BP 120/76 | HR 89 | Temp 98.6°F | Ht 71.0 in | Wt 202.0 lb

## 2016-11-08 DIAGNOSIS — E1110 Type 2 diabetes mellitus with ketoacidosis without coma: Secondary | ICD-10-CM

## 2016-11-08 DIAGNOSIS — E119 Type 2 diabetes mellitus without complications: Secondary | ICD-10-CM

## 2016-11-08 DIAGNOSIS — I10 Essential (primary) hypertension: Secondary | ICD-10-CM

## 2016-11-08 DIAGNOSIS — E1121 Type 2 diabetes mellitus with diabetic nephropathy: Secondary | ICD-10-CM

## 2016-11-08 LAB — POCT GLYCOSYLATED HEMOGLOBIN (HGB A1C): Hemoglobin A1C: 5.7

## 2016-11-08 NOTE — Patient Instructions (Signed)
It was great seeing you today! We have addressed the following issues today 1. Diabetes: Your A1c is 5.7% today. Your goal A1c is less than 7.0%. I have discontinued your glipizide. You do not have to check your blood glucose. Continue watching your diet and exercising. See below for more on diet and exercise. Please come back and see [place] in 6 months 2.  Hypertension: Your blood pressure is 120/76 mmHg. Your goal blood pressure is less than 140/90 mmHg. Continue taking amlodipine.   3.  Colon cancer screening: I recommend you call and schedule your colonoscopy for colon cancer screening using the phone numbers below.   If we did any lab work today, and the results require attention, either me or my nurse will get in touch with you. If everything is normal, you will get a letter in mail and a message via . If you don't hear from [place] in two weeks, please give [place] a call. Otherwise, we look forward to seeing you again at your next visit. If you have any questions or concerns before then, please call the clinic at [phone].  Please bring all your medications to every doctors visit  Sign up for My Chart to have easy access to your labs results, and communication with your Primary care physician.    Please check-out at the front desk before leaving the clinic.              Colon Cancer  People with early colon cancer usually have no warning signs or symptoms.  If found early, most patients can be cured, but if found when it has already spread, the chance of survival is not as good.  Colon cancer is the second most common cause of concern is in the [place] with over 56,000 deaths from colon cancer in 2005  Colon cancer is a common, treatable disease. Screening tests can find a cancer  early, before you have symptoms, and make it more likely that you will survive the disease.  Who needs to be tested? If you are age 41-75 yrs, you should be tested for colon cancer.  Ways to be tested:  A  colonoscopy the best test to detect colon cancer. It requires you to drink a bowel preparation to clean out your colon before the test. During this test, a tube with a camera inserted into your rectum and examines your entire colon. You can be given medicine to make you sleepy during the exam. Therefore, you will not be able to drive immediately after the test. There is a small risk of bowel injury during the test.   Stool cards that you can take home and take a sample of your stool is another option. The cards are not as good as colonoscopy at detecting cancer, but the tests are easier and cheaper.   To schedule the colonoscopy, you can call one of the 3 options below:  [facility]. Phone number: [phone]  [facility]. Phone number: [phone]  [facility]: Phone number [phone]     Take Care,   Dr. Cyndia Skeeters

## 2016-11-08 NOTE — Assessment & Plan Note (Signed)
BP 120/76 today. He is on amlodipine 10 mg and lisinopril 5 mg, although the later is for renal protection. Now his DM is well controlled, I will stop the later to see if this could give him some GFR back. -BMP today

## 2016-11-08 NOTE — Progress Notes (Signed)
  Subjective:    Anthony Moran is a 66 y.o. old male here diabetes and hypertension. He is here with his wife as usual.   HPI Diabetes: A1c 5.7% today. Previous A1c 8.0%. Patient is on glipizide 5 mg twice a day. He reports following the lifestyle recommendation (portion size and daily exercise) from the previous encounter. He denies symptoms of hypo-or hyperglycemia. He did just check his CBG at home. He says it usually 80s to 100s. Denies vision change or numbness in his legs.   Hypertension: he is on amlodipine 10 mg and lisinopril 5 mg daily. Denies dizziness, chest pain or shortness of breath.   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   has a past medical history of Anxiety; Arthritis; Asthma; Cervical spondylosis without myelopathy; Depression; Diabetes mellitus; Hypertension; Lumbar post-laminectomy syndrome; Lumbosacral neuritis; Mononeuritis multiplex; Pneumonia; and Spinal stenosis in cervical region.  FH:  Family History  Problem Relation Age of Onset  . Breast cancer Maternal Aunt   . Prostate cancer Father   . Hypertension Sister     [facility] Social History  Substance Use Topics  . Smoking status: Never Smoker  . Smokeless tobacco: Never Used  . Alcohol use No     Comment: rare    Review of Systems Review of systems negative except for pertinent positives and negatives in history of present illness above.     Objective:     Vitals:   11/08/16 1436  BP: 120/76  Pulse: 89  Temp: 98.6 F (37 C)  TempSrc: Oral  SpO2: 96%  Weight: 202 lb (91.6 kg)  Height: 5\' 11"  (1.803 m)    Physical Exam GEN: appears well, no apparent distress. 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 Congratulated patient and his wife. They are both thrilled with the result. -Stopped his glipizide. Encouraged him to keep up with the life style change including diet and exercise. He reports using portion size from diet stand point.  -He is uptodate on his foot, eye exam and immunizations.  -Follow up in 6 months. They understand that we may need to put him back on medication if his A1c goes up again.   HTN (hypertension) BP 120/76 today. He is on amlodipine 10 mg and lisinopril 5 mg, although the later is for renal protection. Now his DM is well controlled, I will stop the later to see if this could give him some GFR back. -BMP today   Orders Placed This Encounter  Procedures  . HgB A1c    Return for Diabetes and hypertension.  Mercy Riding, MD 11/08/16 Pager: [phone]

## 2016-11-08 NOTE — Assessment & Plan Note (Signed)
A1c 5.7%, from 8.0% previously. Congratulated patient and his wife. They are both thrilled with the result. -Stopped his glipizide. Encouraged him to keep up with the life style change including diet and exercise. He reports using portion size from diet stand point.  -He is uptodate on his foot, eye exam and immunizations.  -Follow up in 6 months. They understand that we may need to put him back on medication if his A1c goes up again.

## 2017-02-21 DIAGNOSIS — D631 Anemia in chronic kidney disease: Secondary | ICD-10-CM | POA: Diagnosis not present

## 2017-02-21 DIAGNOSIS — F419 Anxiety disorder, unspecified: Secondary | ICD-10-CM | POA: Diagnosis not present

## 2017-02-21 DIAGNOSIS — J302 Other seasonal allergic rhinitis: Secondary | ICD-10-CM | POA: Diagnosis not present

## 2017-02-21 DIAGNOSIS — F329 Major depressive disorder, single episode, unspecified: Secondary | ICD-10-CM | POA: Diagnosis not present

## 2017-02-21 DIAGNOSIS — M1990 Unspecified osteoarthritis, unspecified site: Secondary | ICD-10-CM | POA: Diagnosis not present

## 2017-02-21 DIAGNOSIS — N2581 Secondary hyperparathyroidism of renal origin: Secondary | ICD-10-CM | POA: Diagnosis not present

## 2017-02-21 DIAGNOSIS — N183 Chronic kidney disease, stage 3 (moderate): Secondary | ICD-10-CM | POA: Diagnosis not present

## 2017-02-21 DIAGNOSIS — E118 Type 2 diabetes mellitus with unspecified complications: Secondary | ICD-10-CM | POA: Diagnosis not present

## 2017-02-21 DIAGNOSIS — E785 Hyperlipidemia, unspecified: Secondary | ICD-10-CM | POA: Diagnosis not present

## 2017-02-21 DIAGNOSIS — E669 Obesity, unspecified: Secondary | ICD-10-CM | POA: Diagnosis not present

## 2017-02-21 DIAGNOSIS — I129 Hypertensive chronic kidney disease with stage 1 through stage 4 chronic kidney disease, or unspecified chronic kidney disease: Secondary | ICD-10-CM | POA: Diagnosis not present

## 2017-02-21 DIAGNOSIS — J45909 Unspecified asthma, uncomplicated: Secondary | ICD-10-CM | POA: Diagnosis not present

## 2017-02-26 ENCOUNTER — Other Ambulatory Visit: Payer: Self-pay | Admitting: *Deleted

## 2017-02-26 DIAGNOSIS — E785 Hyperlipidemia, unspecified: Secondary | ICD-10-CM

## 2017-02-26 MED ORDER — ATORVASTATIN CALCIUM 40 MG PO TABS: 40.0000 mg | ORAL_TABLET | Freq: Every day | ORAL | 2 refills | Status: DC

## 2017-04-03 ENCOUNTER — Other Ambulatory Visit: Payer: Self-pay | Admitting: Student

## 2017-04-03 DIAGNOSIS — E785 Hyperlipidemia, unspecified: Secondary | ICD-10-CM

## 2017-04-03 MED ORDER — ATORVASTATIN CALCIUM 40 MG PO TABS: 40.0000 mg | ORAL_TABLET | Freq: Every day | ORAL | 2 refills | Status: DC

## 2017-05-13 DIAGNOSIS — N400 Enlarged prostate without lower urinary tract symptoms: Secondary | ICD-10-CM | POA: Diagnosis not present

## 2017-05-13 DIAGNOSIS — N5201 Erectile dysfunction due to arterial insufficiency: Secondary | ICD-10-CM | POA: Diagnosis not present

## 2017-05-13 DIAGNOSIS — Z125 Encounter for screening for malignant neoplasm of prostate: Secondary | ICD-10-CM | POA: Diagnosis not present

## 2017-07-08 DIAGNOSIS — Z6827 Body mass index (BMI) 27.0-27.9, adult: Secondary | ICD-10-CM | POA: Diagnosis not present

## 2017-07-08 DIAGNOSIS — M1990 Unspecified osteoarthritis, unspecified site: Secondary | ICD-10-CM | POA: Diagnosis not present

## 2017-07-08 DIAGNOSIS — E785 Hyperlipidemia, unspecified: Secondary | ICD-10-CM | POA: Diagnosis not present

## 2017-07-08 DIAGNOSIS — I129 Hypertensive chronic kidney disease with stage 1 through stage 4 chronic kidney disease, or unspecified chronic kidney disease: Secondary | ICD-10-CM | POA: Diagnosis not present

## 2017-07-08 DIAGNOSIS — N2581 Secondary hyperparathyroidism of renal origin: Secondary | ICD-10-CM | POA: Diagnosis not present

## 2017-07-08 DIAGNOSIS — F419 Anxiety disorder, unspecified: Secondary | ICD-10-CM | POA: Diagnosis not present

## 2017-07-08 DIAGNOSIS — N183 Chronic kidney disease, stage 3 (moderate): Secondary | ICD-10-CM | POA: Diagnosis not present

## 2017-07-08 DIAGNOSIS — D631 Anemia in chronic kidney disease: Secondary | ICD-10-CM | POA: Diagnosis not present

## 2017-07-08 DIAGNOSIS — N400 Enlarged prostate without lower urinary tract symptoms: Secondary | ICD-10-CM | POA: Diagnosis not present

## 2017-07-08 DIAGNOSIS — J302 Other seasonal allergic rhinitis: Secondary | ICD-10-CM | POA: Diagnosis not present

## 2017-07-08 DIAGNOSIS — E118 Type 2 diabetes mellitus with unspecified complications: Secondary | ICD-10-CM | POA: Diagnosis not present

## 2017-07-08 DIAGNOSIS — F329 Major depressive disorder, single episode, unspecified: Secondary | ICD-10-CM | POA: Diagnosis not present

## 2017-07-16 ENCOUNTER — Ambulatory Visit: Payer: Self-pay | Admitting: Student

## 2017-07-25 ENCOUNTER — Other Ambulatory Visit: Payer: Self-pay

## 2017-07-25 ENCOUNTER — Encounter: Payer: Self-pay | Admitting: Student

## 2017-07-25 ENCOUNTER — Ambulatory Visit (INDEPENDENT_AMBULATORY_CARE_PROVIDER_SITE_OTHER): Payer: Medicare Other | Admitting: Student

## 2017-07-25 VITALS — BP 138/80 | HR 77 | Temp 98.3°F | Ht 70.0 in | Wt 184.0 lb

## 2017-07-25 DIAGNOSIS — E1121 Type 2 diabetes mellitus with diabetic nephropathy: Secondary | ICD-10-CM | POA: Diagnosis not present

## 2017-07-25 DIAGNOSIS — M545 Low back pain, unspecified: Secondary | ICD-10-CM

## 2017-07-25 DIAGNOSIS — E785 Hyperlipidemia, unspecified: Secondary | ICD-10-CM

## 2017-07-25 DIAGNOSIS — Z23 Encounter for immunization: Secondary | ICD-10-CM | POA: Diagnosis not present

## 2017-07-25 DIAGNOSIS — G8929 Other chronic pain: Secondary | ICD-10-CM

## 2017-07-25 DIAGNOSIS — R809 Proteinuria, unspecified: Secondary | ICD-10-CM

## 2017-07-25 DIAGNOSIS — I10 Essential (primary) hypertension: Secondary | ICD-10-CM

## 2017-07-25 DIAGNOSIS — Z862 Personal history of diseases of the blood and blood-forming organs and certain disorders involving the immune mechanism: Secondary | ICD-10-CM

## 2017-07-25 DIAGNOSIS — J309 Allergic rhinitis, unspecified: Secondary | ICD-10-CM

## 2017-07-25 LAB — POCT UA - MICROALBUMIN
CREATININE, POC: 300 mg/dL
MICROALBUMIN (UR) POC: 150 mg/L

## 2017-07-25 MED ORDER — PNEUMOCOCCAL 13-VAL CONJ VACC IM SUSP: 0.5000 mL | Freq: Once | INTRAMUSCULAR | 0 refills | Status: AC

## 2017-07-25 MED ORDER — LORATADINE 10 MG PO TABS: 10.0000 mg | ORAL_TABLET | Freq: Every day | ORAL | 11 refills | Status: DC

## 2017-07-25 MED ORDER — LISINOPRIL 5 MG PO TABS: 5.0000 mg | ORAL_TABLET | Freq: Every day | ORAL | 3 refills | Status: AC

## 2017-07-25 NOTE — Progress Notes (Signed)
Subjective:    Anthony Moran is a 67 y.o. old male here for diabetes and back pain.  He is here with his wife.  HPI Diabetes: well-controlled.  Last A1c 5.7% about a months ago.  Not on medication. Checks his blood glucose intermittently.  He states his blood glucose ranges from 79-140. Mostly 80-110. Eye exam about a year ago. Hasn't been to a dentist in over a year.  Exercise is limited due to his back pain.  He reports watching his diet.  He does not drink soda or juice.  Denies polydipsia or polyuria.  Denies vision change.  Reports some numbness in his feet bilaterally.  Still taking Lipitor, aspirin and lisinopril 5 mg daily.  Back pain: this is a chronic issue that flares up intermittently.  The current flareup started 3 weeks ago. He thinks he overdid his exercise.  He denies lifting heavy weight.  He says it always comes after exercise. Pain is over lower back bilaterally.  He describes the pain as dull and sometimes sharp. Hurts when he gets up in the morning for about one hour. Eases after walking.  Overall, patient progression is worse. Denies weakness and tingling in his legs.  Denies fever, unintentional weight loss, urinary retention, bowel or bladder issue or saddle anesthesia.  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 Repeat lumbar MRI in 2015 with L3-4, spondylolisthesis, DDD, spinal stenosis compressing both the L3 and the L4 nerve roots s/p L3 Laminotomy/foraminotomies to decompress the bilateral L3 and L4 nerve roots in 2016.   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   has a past medical history of Anxiety, Arthritis, Asthma, Cervical spondylosis without myelopathy, Depression, Diabetes mellitus, Hypertension, Lumbar post-laminectomy syndrome, Lumbosacral neuritis, Mononeuritis multiplex, Pneumonia, and Spinal stenosis in cervical region.  FH:  Family History  Problem Relation Age of Onset  . Prostate cancer Father   . Breast cancer Maternal Aunt   . Hypertension Sister     [name] Social History   Tobacco Use  . Smoking status: Never Smoker  . Smokeless tobacco: Never Used  Substance Use Topics  . Alcohol use: No    Comment: rare  . Drug use: No    Review of Systems Review of systems negative except for pertinent positives and negatives in history of present illness above.     Objective:     Vitals:   07/25/17 1338  BP: 138/80  Pulse: 77  Temp: 98.3 F (36.8 C)  TempSrc: Oral  SpO2: 100%  Weight: 184 lb (83.5 kg)  Height: _0  (1.778 m)   Body mass index is 26.4 kg/m.  Physical Exam  GEN: Very pleasant, appears well, no apparent distress. HEM: negative for cervical or periauricular lymphadenopathies CVS: RRR, nl s1 & s2, no murmurs, no edema,  2+ DP & PT pulses bilaterally RESP: no IWOB, good air movement bilaterally, CTAB GI: BS present & normal, soft, NTND MSK: Back & LE exam  Normal skin, spine with normal alignment and no deformity. LE symmetric appears symmetric. Midline surgical scar over lumbar area  No step offs, no tenderness to palpation over spines.  Mild tenderness over her lumbosacral paraspinal muscles bilaterally  He  has fair range of motion in his back  Lying and seated SLR are negative  Neuro exam in LE: motor 5/5 in all muscle groups, light sensation intact in L4-S1 dermatomes, patellar reflexes 1+ bilaterally, monofilament exam intact in both feet  2+ DP pulses bilaterally  SKIN: Midline surgical scar over lumbar area PSYCH: euthymic mood with congruent affect  Diabetic Foot  Exam: Inspection: no skin lesion, ulcer or callus. Toenails trimmed. Noted hematoma under his right big toe Vascular: DP & PT pulses 2+ bilaterally Neuro: Intact 9-point monofilament exam bilaterally    Assessment and Plan:  1. 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  Unfortunately, not able to do point-of-care A1c today as lab run out of the test strip.  Discussed about diet again.  Gave him handout.  Encouraged him to see an eye doctor for his annual eye exam.  Also encouraged him to find a dentist for his regular dental checkup.  Microalbuminuria has improved.  Recommended proper shoe wear.  Will continue lisinopril.  We may consider increasing the dose if his renal function is stable.  Follow-up in 3 months. - Hemoglobin A1c  2. Essential hypertension: Stable.  Will continue his amlodipine and lisinopril.  Lifestyle changes above.  Follow-up in 3 months. - CMP14+EGFR  3. Chronic bilateral low back pain without sciatica: this is likely due to underlying degenerative disc disease.  Neuro exam within normal limit.  He has no red flags for infectious etiology, fracture or malignancy.  He has an upcoming appointment with neurosurgery in 3 months.  I recommended taking Tylenol around-the-clock.  We also discussed about light exercise such as stretching.  He can continue applying ice which has been helpful.  4. Hyperlipidemia, unspecified hyperlipidemia type: Continue Lipitor - Lipid panel  5. History of anemia - CBC with Differential/Platelet  6. Proteinuria, unspecified type: He is followed by nephrology. -Continue lisinopril for renal protection.  We may consider increasing if renal function stays stable.  7. Allergic rhinitis, unspecified seasonality, unspecified trigger: Discussed about proper way of using his Flonase.  He has been spraying his nasal septum which might be the reason why Flonase was not helpful.  Recommended  taking loratadine as well.  Unfortunately, he does not have prescription coverage to try Xyzal. - loratadine (CLARITIN) 10 MG tablet; Take 1 tablet (10 mg total) by mouth daily.  Dispense: 30 tablet; Refill: 11  8. Encounter for immunization - pneumococcal 13-valent conjugate vaccine (PREVNAR 13) SUSP injection; Inject 0.5 mLs into the muscle once for 1 dose.  Dispense: 0.5 mL; Refill: 0  9. Need for immunization against influenza - Flu Vaccine QUAD 36+ mos IM  Return in about 3 months (around 10/22/2017) for Diabetes and hypertension.  Mercy Riding, MD 07/25/17 Pager: [phone]

## 2017-07-25 NOTE — Patient Instructions (Addendum)
It was great seeing you today! We have addressed the following issues today  Diabetes: Continue diet and exercise as below.  Please schedule your follow-up with your eye doctor and a dentist.   Back pain: This is likely muscle spasm.  It could also be due to arthritis that could happen with aging.  I recommend going easier on exercise.  Take Tylenol around-the-clock as we discussed.  You can try ice or heat, whichever helps.  If no improvement with this over the next couple of weeks, please come back and see [place].   If we did any lab work today, and the results require attention, either me or my nurse will get in touch with you. If everything is normal, you will get a letter in mail and a message via . If you don't hear from [place] in two weeks, please give [place] a call. Otherwise, we look forward to seeing you again at your next visit. If you have any questions or concerns before then, please call the clinic at [phone].  Please bring all your medications to every doctors visit  Sign up for My Chart to have easy access to your labs results, and communication with your Primary care physician.    Please check-out at the front desk before leaving the clinic.    Take Care,   Dr. Cyndia Skeeters

## 2017-07-26 LAB — CBC WITH DIFFERENTIAL/PLATELET
BASOS ABS: 0 10*3/uL (ref 0.0–0.2)
Basos: 1 %
EOS (ABSOLUTE): 0.1 10*3/uL (ref 0.0–0.4)
Eos: 2 %
HEMOGLOBIN: 11.4 g/dL — AB (ref 13.0–17.7)
Hematocrit: 35.7 % — ABNORMAL LOW (ref 37.5–51.0)
IMMATURE GRANS (ABS): 0 10*3/uL (ref 0.0–0.1)
Immature Granulocytes: 0 %
LYMPHS: 31 %
Lymphocytes Absolute: 1.2 10*3/uL (ref 0.7–3.1)
MCH: 25.6 pg — AB (ref 26.6–33.0)
MCHC: 31.9 g/dL (ref 31.5–35.7)
MCV: 80 fL (ref 79–97)
MONOCYTES: 5 %
Monocytes Absolute: 0.2 10*3/uL (ref 0.1–0.9)
NEUTROS ABS: 2.4 10*3/uL (ref 1.4–7.0)
NEUTROS PCT: 61 %
Platelets: 222 10*3/uL (ref 150–379)
RBC: 4.45 x10E6/uL (ref 4.14–5.80)
RDW: 14 % (ref 12.3–15.4)
WBC: 4 10*3/uL (ref 3.4–10.8)

## 2017-07-26 LAB — LIPID PANEL
CHOL/HDL RATIO: 1.8 ratio (ref 0.0–5.0)
Cholesterol, Total: 129 mg/dL (ref 100–199)
HDL: 71 mg/dL (ref >39)
LDL Calculated: 50 mg/dL (ref 0–99)
Triglycerides: 42 mg/dL (ref 0–149)
VLDL Cholesterol Cal: 8 mg/dL (ref 5–40)

## 2017-07-26 LAB — CMP14+EGFR
A/G RATIO: 2 (ref 1.2–2.2)
ALBUMIN: 4.5 g/dL (ref 3.6–4.8)
ALT: 10 IU/L (ref 0–44)
AST: 14 IU/L (ref 0–40)
Alkaline Phosphatase: 89 IU/L (ref 39–117)
BUN / CREAT RATIO: 7 — AB (ref 10–24)
BUN: 13 mg/dL (ref 8–27)
Bilirubin Total: 0.6 mg/dL (ref 0.0–1.2)
CALCIUM: 9.5 mg/dL (ref 8.6–10.2)
CO2: 22 mmol/L (ref 20–29)
CREATININE: 1.79 mg/dL — AB (ref 0.76–1.27)
Chloride: 104 mmol/L (ref 96–106)
GFR, EST AFRICAN AMERICAN: 45 mL/min/{1.73_m2} — AB (ref >59)
GFR, EST NON AFRICAN AMERICAN: 39 mL/min/{1.73_m2} — AB (ref >59)
GLOBULIN, TOTAL: 2.3 g/dL (ref 1.5–4.5)
Glucose: 100 mg/dL — ABNORMAL HIGH (ref 65–99)
POTASSIUM: 4.2 mmol/L (ref 3.5–5.2)
SODIUM: 141 mmol/L (ref 134–144)
TOTAL PROTEIN: 6.8 g/dL (ref 6.0–8.5)

## 2017-07-26 LAB — HEMOGLOBIN A1C
Est. average glucose Bld gHb Est-mCnc: 131 mg/dL
HEMOGLOBIN A1C: 6.2 % — AB (ref 4.8–5.6)

## 2017-07-30 ENCOUNTER — Encounter: Payer: Self-pay | Admitting: Student

## 2017-07-30 NOTE — Progress Notes (Signed)
Attempted to call patient about his recent lab results from his recent visit.  Unfortunately, patient did not pick up the form.  Serum creatinine 1.79 (improved).  Hemoglobin 11.4 (improved).  A1c 6.2%.  No further intervention or medication changes needed at this time.  Follow-up in 3 months as previously discussed.  Result letter routed to admin for mail out.

## 2017-08-02 ENCOUNTER — Other Ambulatory Visit: Payer: Self-pay | Admitting: Student

## 2017-08-02 DIAGNOSIS — E1110 Type 2 diabetes mellitus with ketoacidosis without coma: Secondary | ICD-10-CM

## 2017-10-17 DIAGNOSIS — Z125 Encounter for screening for malignant neoplasm of prostate: Secondary | ICD-10-CM | POA: Diagnosis not present

## 2017-10-25 ENCOUNTER — Other Ambulatory Visit: Payer: Self-pay

## 2017-10-25 ENCOUNTER — Encounter: Payer: Self-pay | Admitting: Student

## 2017-10-25 ENCOUNTER — Ambulatory Visit (INDEPENDENT_AMBULATORY_CARE_PROVIDER_SITE_OTHER): Payer: Medicare Other | Admitting: Student

## 2017-10-25 VITALS — BP 125/84 | HR 80 | Temp 98.3°F | Wt 183.0 lb

## 2017-10-25 DIAGNOSIS — S90221A Contusion of right lesser toe(s) with damage to nail, initial encounter: Secondary | ICD-10-CM | POA: Insufficient documentation

## 2017-10-25 DIAGNOSIS — G8929 Other chronic pain: Secondary | ICD-10-CM | POA: Diagnosis not present

## 2017-10-25 DIAGNOSIS — S90221D Contusion of right lesser toe(s) with damage to nail, subsequent encounter: Secondary | ICD-10-CM

## 2017-10-25 DIAGNOSIS — M545 Low back pain, unspecified: Secondary | ICD-10-CM

## 2017-10-25 DIAGNOSIS — F99 Mental disorder, not otherwise specified: Secondary | ICD-10-CM | POA: Insufficient documentation

## 2017-10-25 MED ORDER — CYCLOBENZAPRINE HCL 10 MG PO TABS: 10.0000 mg | ORAL_TABLET | Freq: Every day | ORAL | 1 refills | Status: DC

## 2017-10-25 NOTE — Patient Instructions (Signed)
It was great seeing you today! We have addressed the following issues today  Back pain: We gave you a prescription for Flexeril (muscle relaxer).  Please take this medication at night before bedtime.  You can also take Tylenol 1000 mg 3 times a day.  Recommend daily walking, at least 30 minutes.  See below for more on back exercise.  Toenail: I strongly recommend you wear loose fitting shoe  Please arrive about 15 minutes yearly for your future appointments.  Our clinic policy is to reschedule your appointment if you late by 15 minutes.  If we did any lab work today, and the results require attention, either me or my nurse will get in touch with you. If everything is normal, you will get a letter in mail and a message via . If you don't hear from [place] in two weeks, please give [place] a call. Otherwise, we look forward to seeing you again at your next visit. If you have any questions or concerns before then, please call the clinic at [phone].  Please bring all your medications to every doctors visit  Sign up for My Chart to have easy access to your labs results, and communication with your Primary care physician.    Please check-out at the front desk before leaving the clinic.    Take Care,   Dr. Cyndia Skeeters   Back Exercises If you have pain in your back, do these exercises 2-3 times each day or as told by your doctor. When the pain goes away, do the exercises once each day, but repeat the steps more times for each exercise (do more repetitions). If you do not have pain in your back, do these exercises once each day or as told by your doctor. Exercises Single Knee to Chest  Do these steps 3-5 times in a row for each leg: 1. Lie on your back on a firm bed or the floor with your legs stretched out. 2. Bring one knee to your chest. 3. Hold your knee to your chest by grabbing your knee or thigh. 4. Pull on your knee until you feel a gentle stretch in your lower back. 5. Keep doing the stretch  for 10-30 seconds. 6. Slowly let go of your leg and straighten it.  Pelvic Tilt  Do these steps 5-10 times in a row: 1. Lie on your back on a firm bed or the floor with your legs stretched out. 2. Bend your knees so they point up to the ceiling. Your feet should be flat on the floor. 3. Tighten your lower belly (abdomen) muscles to press your lower back against the floor. This will make your tailbone point up to the ceiling instead of pointing down to your feet or the floor. 4. Stay in this position for 5-10 seconds while you gently tighten your muscles and breathe evenly.  Cat-Cow  Do these steps until your lower back bends more easily: 1. Get on your hands and knees on a firm surface. Keep your hands under your shoulders, and keep your knees under your hips. You may put padding under your knees. 2. Let your head hang down, and make your tailbone point down to the floor so your lower back is round like the back of a cat. 3. Stay in this position for 5 seconds. 4. Slowly lift your head and make your tailbone point up to the ceiling so your back hangs low (sags) like the back of a cow. 5. Stay in this position for  5 seconds.  Press-Ups  Do these steps 5-10 times in a row: 1. Lie on your belly (face-down) on the floor. 2. Place your hands near your head, about shoulder-width apart. 3. While you keep your back relaxed and keep your hips on the floor, slowly straighten your arms to raise the top half of your body and lift your shoulders. Do not use your back muscles. To make yourself more comfortable, you may change where you place your hands. 4. Stay in this position for 5 seconds. 5. Slowly return to lying flat on the floor.  Bridges  Do these steps 10 times in a row: 1. Lie on your back on a firm surface. 2. Bend your knees so they point up to the ceiling. Your feet should be flat on the floor. 3. Tighten your butt muscles and lift your butt off of the floor until your waist is  almost as high as your knees. If you do not feel the muscles working in your butt and the back of your thighs, slide your feet 1-2 inches farther away from your butt. 4. Stay in this position for 3-5 seconds. 5. Slowly lower your butt to the floor, and let your butt muscles relax.  If this exercise is too easy, try doing it with your arms crossed over your chest. Belly Crunches  Do these steps 5-10 times in a row: 1. Lie on your back on a firm bed or the floor with your legs stretched out. 2. Bend your knees so they point up to the ceiling. Your feet should be flat on the floor. 3. Cross your arms over your chest. 4. Tip your chin a little bit toward your chest but do not bend your neck. 5. Tighten your belly muscles and slowly raise your chest just enough to lift your shoulder blades a tiny bit off of the floor. 6. Slowly lower your chest and your head to the floor.  Back Lifts Do these steps 5-10 times in a row: 1. Lie on your belly (face-down) with your arms at your sides, and rest your forehead on the floor. 2. Tighten the muscles in your legs and your butt. 3. Slowly lift your chest off of the floor while you keep your hips on the floor. Keep the back of your head in line with the curve in your back. Look at the floor while you do this. 4. Stay in this position for 3-5 seconds. 5. Slowly lower your chest and your face to the floor.  Contact a doctor if:  Your back pain gets a lot worse when you do an exercise.  Your back pain does not lessen 2 hours after you exercise. If you have any of these problems, stop doing the exercises. Do not do them again unless your doctor says it is okay. Get help right away if:  You have sudden, very bad back pain. If this happens, stop doing the exercises. Do not do them again unless your doctor says it is okay. This information is not intended to replace advice given to you by your health care provider. Make sure you discuss any questions you have  with your health care provider. Document Released: 06/30/2010 Document Revised: 11/03/2015 Document Reviewed: 07/22/2014 Elsevier Interactive Patient Education  [employer].

## 2017-10-25 NOTE — Progress Notes (Signed)
Subjective:    Anthony Moran is a 67 y.o. old male here for back pain and concern about his toe nail.  Patient is here with his wife.  HPI  Back pain: chronic issue.  He thinks his pain flared up because of the exercise he has done about 3 days ago.  Pain is localized to his lower back mainly on the right but occasionally radiates to his left.  Denies radiation to his legs.  Denies numbness or tingling in his legs.  Denies fever, excessive night sweats, unintentional weight loss, saddle anesthesia, urinary retention, bowel or bladder issue.  He has no history of cancer.  He also states that he has not been taking his Tylenol as recommended out of concern for side effect on his kidney.  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 Repeat lumbar MRI in 2015 with L3-4, spondylolisthesis, DDD, spinal stenosis compressing both the L3 and the L4 nerve roots s/p L3 Laminotomy/foraminotomies to decompress the bilateral L3 and L4 nerve roots in 2016.   He was seen for similar back pain about 3 months ago.  At that visit, he was recommended to take Tylenol 1000 mg 3 times daily.   Toenail: he had right big toenail discoloration for months.  He was advised to wear loose fitting shoe in the past.  States he just recently got a loose fitting shoe.   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   has a past medical history of Anxiety, Arthritis, Asthma, Cervical spondylosis without myelopathy, Depression, Diabetes mellitus, Hypertension,  Lumbar post-laminectomy syndrome, Lumbosacral neuritis, Mononeuritis multiplex, Pneumonia, and Spinal stenosis in cervical region.  FH:  Family History  Problem Relation Age of Onset  . Prostate cancer Father   . Breast cancer Maternal Aunt   . Hypertension Sister     [facility] Social History   Tobacco Use  . Smoking status: Never Smoker  . Smokeless tobacco: Never Used  Substance Use Topics  . Alcohol use: No    Comment: rare  . Drug use: No    Review of Systems Review of systems negative except for pertinent positives and negatives in history of present illness above.     Objective:     Vitals:   10/25/17 0921  BP: 125/84  Pulse: 80  Temp: 98.3 F (36.8 C)  TempSrc: Oral  SpO2: 96%  Weight: 183 lb (83 kg)   Body mass index is 26.26 kg/m.  Physical Exam  GEN: appears well & comfortable. No apparent distress. CVS: RRR, nl s1 & s2, no murmurs, no edema RESP: no IWOB, good air movement bilaterally, CTAB GI: BS present & normal, soft, NTND GU: no suprapubic or CVA tenderness MSK: Back & LE exam  Surgical scar over his lower back from the surgery.  No overlying skin erythema.  No step offs, mild tenderness to palpation over lumbar spines and lumbar paraspinous muscles  Range of motion limited flexion due to spasm  Neuro exam in LE: motor 5/5 in all muscle groups, light sensation intact in L4-S1 dermatomes, patellar reflexes 2+ bilaterally  SKIN: As above NEURO: alert and oiented appropriately, no gross deficits  PSYCH: euthymic mood with congruent affect    Assessment and Plan:  1. Chronic bilateral low back pain without sciatica: Chronic issue.  Pain flareup likely due to recent unsupervised physical activity.  Patient has history of prior lumbar spine surgery with instrumentation.  He has no numbness or tingling.  He has no red flags for acute fracture, infectious process, malignancy or cauda equina.  I reassured him about his concern about taking Tylenol and  regular basis.  We will give him Flexeril 10 mg nightly for muscle spasm.  Discussed about back exercise and gave him handout.  Offered him physical therapy but patient wanted to try the above interventions first. - cyclobenzaprine (FLEXERIL) 10 MG tablet; Take 1 tablet (10 mg total) by mouth at bedtime.  Dispense: 30 tablet; Refill: 1  2. Subungual hematoma of toenail of right foot, subsequent encounter: Likely due to tight fitting shoe.  He was advised to get loose fitting shoe that he just got.  He denies any drainable hematoma to drain.  He has no surrounding skin change or swelling to think of infectious etiology.  Follow-up as needed  3. Inappropriate behavior: patient arrived about 22 minutes late for his appointment toay. It was brought to my attention that he was not appropriate with the front staff when he was told that he was late and had to reschedule or wait for overflow. I discussed our late policy and the consequence of his inappropriate behavior with clinic staff. He apologized to me and promised to apologize to the clinic staff on his way out.   Return in about 2 months (around 12/25/2017) for Meet new PCP.  Mercy Riding, MD 10/25/17 Pager: [phone]

## 2017-11-01 DIAGNOSIS — E785 Hyperlipidemia, unspecified: Secondary | ICD-10-CM | POA: Diagnosis not present

## 2017-11-01 DIAGNOSIS — E118 Type 2 diabetes mellitus with unspecified complications: Secondary | ICD-10-CM | POA: Diagnosis not present

## 2017-11-01 DIAGNOSIS — N400 Enlarged prostate without lower urinary tract symptoms: Secondary | ICD-10-CM | POA: Diagnosis not present

## 2017-11-01 DIAGNOSIS — J45909 Unspecified asthma, uncomplicated: Secondary | ICD-10-CM | POA: Diagnosis not present

## 2017-11-01 DIAGNOSIS — I129 Hypertensive chronic kidney disease with stage 1 through stage 4 chronic kidney disease, or unspecified chronic kidney disease: Secondary | ICD-10-CM | POA: Diagnosis not present

## 2017-11-01 DIAGNOSIS — M4800 Spinal stenosis, site unspecified: Secondary | ICD-10-CM | POA: Diagnosis not present

## 2017-11-01 DIAGNOSIS — E1122 Type 2 diabetes mellitus with diabetic chronic kidney disease: Secondary | ICD-10-CM | POA: Diagnosis not present

## 2017-11-01 DIAGNOSIS — M47812 Spondylosis without myelopathy or radiculopathy, cervical region: Secondary | ICD-10-CM | POA: Diagnosis not present

## 2017-11-01 DIAGNOSIS — N183 Chronic kidney disease, stage 3 (moderate): Secondary | ICD-10-CM | POA: Diagnosis not present

## 2017-11-01 DIAGNOSIS — N2581 Secondary hyperparathyroidism of renal origin: Secondary | ICD-10-CM | POA: Diagnosis not present

## 2017-11-01 DIAGNOSIS — M1990 Unspecified osteoarthritis, unspecified site: Secondary | ICD-10-CM | POA: Diagnosis not present

## 2017-11-01 DIAGNOSIS — D631 Anemia in chronic kidney disease: Secondary | ICD-10-CM | POA: Diagnosis not present

## 2017-11-08 DIAGNOSIS — M545 Low back pain: Secondary | ICD-10-CM | POA: Diagnosis not present

## 2017-11-08 DIAGNOSIS — Z6826 Body mass index (BMI) 26.0-26.9, adult: Secondary | ICD-10-CM | POA: Diagnosis not present

## 2017-11-08 DIAGNOSIS — I10 Essential (primary) hypertension: Secondary | ICD-10-CM | POA: Diagnosis not present

## 2017-11-26 DIAGNOSIS — M48061 Spinal stenosis, lumbar region without neurogenic claudication: Secondary | ICD-10-CM | POA: Diagnosis not present

## 2017-11-26 DIAGNOSIS — M5136 Other intervertebral disc degeneration, lumbar region: Secondary | ICD-10-CM | POA: Diagnosis not present

## 2017-12-26 ENCOUNTER — Telehealth: Payer: Self-pay | Admitting: Family Medicine

## 2017-12-26 NOTE — Telephone Encounter (Signed)
LVM to set up appt to meet new PCP. Please assist in doing so

## 2018-01-15 ENCOUNTER — Ambulatory Visit: Payer: Medicare Other | Admitting: Family Medicine

## 2018-01-31 ENCOUNTER — Ambulatory Visit (INDEPENDENT_AMBULATORY_CARE_PROVIDER_SITE_OTHER): Payer: Medicare Other | Admitting: Family Medicine

## 2018-01-31 ENCOUNTER — Other Ambulatory Visit: Payer: Self-pay

## 2018-01-31 ENCOUNTER — Encounter: Payer: Self-pay | Admitting: Family Medicine

## 2018-01-31 VITALS — BP 128/70 | HR 73 | Temp 98.6°F | Ht 70.0 in | Wt 184.0 lb

## 2018-01-31 DIAGNOSIS — Z1211 Encounter for screening for malignant neoplasm of colon: Secondary | ICD-10-CM | POA: Diagnosis not present

## 2018-01-31 DIAGNOSIS — E1121 Type 2 diabetes mellitus with diabetic nephropathy: Secondary | ICD-10-CM | POA: Diagnosis not present

## 2018-01-31 LAB — POCT GLYCOSYLATED HEMOGLOBIN (HGB A1C): HbA1c, POC (controlled diabetic range): 5.8 % (ref 0.0–7.0)

## 2018-01-31 NOTE — Patient Instructions (Signed)
It was a pleasure to see you today! Thank you for choosing [facility] for your primary care. Rubin Hurwitz was seen for diabetes check up.   1. Get your A1C taken today before leaving.   2. Please have your eye doctor records sent to the clinic  3. We have placed a referal for you to get your colonoscopy. You will be called with more information.   Best,  [name], MD, [place] - PGY1 01/31/2018 3:11 PM

## 2018-01-31 NOTE — Assessment & Plan Note (Signed)
Diabetes well controlled with diet.  A1c today 5.7.  Follow-up in 6 months for A1c recheck.

## 2018-01-31 NOTE — Progress Notes (Signed)
    Subjective:  Anthony Moran is a 67 y.o. male who presents to the [facility] today for diabetes follow-up and to meet new PCP.  HPI: Diabetes A1c today 5.7.  Well-controlled.  Not on any medication.  Foot exam today normal.  Predominantly controlled by diet.  Patient also very active.  Lipids well controlled and on statin therapy.  Patient is diabetic nephropathy, he is on lisinopril for renal protection.  Patient saw ophthalmologist yesterday for screening exam.  Will have records faxed office.  Colonoscopy Patient needs follow-up colonoscopy for routine cancer of the colon screening.  He is agreeable to repeat colonoscopy.   ROS: Per HPI  PMH: Smoking history reviewed.    Objective:  Physical Exam: BP 128/70   Pulse 73   Temp 98.6 F (37 C) (Oral)   Ht 5\' 10"  (1.778 m)   Wt 184 lb (83.5 kg)   SpO2 98%   BMI 26.40 kg/m   Gen: NAD, resting comfortably CV: RRR with no murmurs appreciated Pulm: NWOB, CTAB with no crackles, wheezes, or rhonchi  Diabetic Foot Exam - Simple   Simple Foot Form Diabetic Foot exam was performed with the following findings:  Yes 01/31/2018  3:38 PM  Visual Inspection No deformities, no ulcerations, no other skin breakdown bilaterally:  Yes Sensation Testing Intact to touch and monofilament testing bilaterally:  Yes Pulse Check Posterior Tibialis and Dorsalis pulse intact bilaterally:  Yes Comments      Results for orders placed or performed in visit on 01/31/18 (from the past 72 hour(s))  HgB A1c     Status: None   Collection Time: 01/31/18  3:18 PM  Result Value Ref Range   Hemoglobin A1C     HbA1c POC (<> result, manual entry)     HbA1c, POC (prediabetic range)     HbA1c, POC (controlled diabetic range) 5.8 0.0 - 7.0 %     Assessment/Plan:  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  A1c today 5.7.  Follow-up in 6 months for A1c recheck.    Lab Orders     HgB A1c  No  orders of the defined types were placed in this encounter.   Anthony Lowenstein, MD, MS FAMILY MEDICINE RESIDENT - PGY2 01/31/2018 3:42 PM

## 2018-03-04 ENCOUNTER — Other Ambulatory Visit: Payer: Self-pay | Admitting: *Deleted

## 2018-03-04 DIAGNOSIS — E785 Hyperlipidemia, unspecified: Secondary | ICD-10-CM

## 2018-03-05 MED ORDER — ATORVASTATIN CALCIUM 40 MG PO TABS: 40.0000 mg | ORAL_TABLET | Freq: Every day | ORAL | 3 refills | Status: DC

## 2018-03-10 DIAGNOSIS — N189 Chronic kidney disease, unspecified: Secondary | ICD-10-CM | POA: Diagnosis not present

## 2018-03-10 DIAGNOSIS — J45909 Unspecified asthma, uncomplicated: Secondary | ICD-10-CM | POA: Diagnosis not present

## 2018-03-10 DIAGNOSIS — E785 Hyperlipidemia, unspecified: Secondary | ICD-10-CM | POA: Diagnosis not present

## 2018-03-10 DIAGNOSIS — N400 Enlarged prostate without lower urinary tract symptoms: Secondary | ICD-10-CM | POA: Diagnosis not present

## 2018-03-10 DIAGNOSIS — J302 Other seasonal allergic rhinitis: Secondary | ICD-10-CM | POA: Diagnosis not present

## 2018-03-10 DIAGNOSIS — N2581 Secondary hyperparathyroidism of renal origin: Secondary | ICD-10-CM | POA: Diagnosis not present

## 2018-03-10 DIAGNOSIS — I129 Hypertensive chronic kidney disease with stage 1 through stage 4 chronic kidney disease, or unspecified chronic kidney disease: Secondary | ICD-10-CM | POA: Diagnosis not present

## 2018-03-10 DIAGNOSIS — N183 Chronic kidney disease, stage 3 (moderate): Secondary | ICD-10-CM | POA: Diagnosis not present

## 2018-03-10 DIAGNOSIS — E1122 Type 2 diabetes mellitus with diabetic chronic kidney disease: Secondary | ICD-10-CM | POA: Diagnosis not present

## 2018-03-10 DIAGNOSIS — Z23 Encounter for immunization: Secondary | ICD-10-CM | POA: Diagnosis not present

## 2018-03-10 DIAGNOSIS — M47812 Spondylosis without myelopathy or radiculopathy, cervical region: Secondary | ICD-10-CM | POA: Diagnosis not present

## 2018-03-10 DIAGNOSIS — D631 Anemia in chronic kidney disease: Secondary | ICD-10-CM | POA: Diagnosis not present

## 2018-03-10 DIAGNOSIS — E118 Type 2 diabetes mellitus with unspecified complications: Secondary | ICD-10-CM | POA: Diagnosis not present

## 2018-04-01 ENCOUNTER — Encounter: Payer: Self-pay | Admitting: Family Medicine

## 2018-06-06 DIAGNOSIS — R351 Nocturia: Secondary | ICD-10-CM | POA: Diagnosis not present

## 2018-06-06 DIAGNOSIS — Z125 Encounter for screening for malignant neoplasm of prostate: Secondary | ICD-10-CM | POA: Diagnosis not present

## 2018-06-06 DIAGNOSIS — N401 Enlarged prostate with lower urinary tract symptoms: Secondary | ICD-10-CM | POA: Diagnosis not present

## 2018-06-06 DIAGNOSIS — N5201 Erectile dysfunction due to arterial insufficiency: Secondary | ICD-10-CM | POA: Diagnosis not present

## 2018-07-15 DIAGNOSIS — E1122 Type 2 diabetes mellitus with diabetic chronic kidney disease: Secondary | ICD-10-CM | POA: Diagnosis not present

## 2018-07-15 DIAGNOSIS — N400 Enlarged prostate without lower urinary tract symptoms: Secondary | ICD-10-CM | POA: Diagnosis not present

## 2018-07-15 DIAGNOSIS — M4800 Spinal stenosis, site unspecified: Secondary | ICD-10-CM | POA: Diagnosis not present

## 2018-07-15 DIAGNOSIS — J302 Other seasonal allergic rhinitis: Secondary | ICD-10-CM | POA: Diagnosis not present

## 2018-07-15 DIAGNOSIS — E785 Hyperlipidemia, unspecified: Secondary | ICD-10-CM | POA: Diagnosis not present

## 2018-07-15 DIAGNOSIS — N2581 Secondary hyperparathyroidism of renal origin: Secondary | ICD-10-CM | POA: Diagnosis not present

## 2018-07-15 DIAGNOSIS — D631 Anemia in chronic kidney disease: Secondary | ICD-10-CM | POA: Diagnosis not present

## 2018-07-15 DIAGNOSIS — J45909 Unspecified asthma, uncomplicated: Secondary | ICD-10-CM | POA: Diagnosis not present

## 2018-07-15 DIAGNOSIS — N183 Chronic kidney disease, stage 3 (moderate): Secondary | ICD-10-CM | POA: Diagnosis not present

## 2018-07-15 DIAGNOSIS — M1990 Unspecified osteoarthritis, unspecified site: Secondary | ICD-10-CM | POA: Diagnosis not present

## 2018-07-15 DIAGNOSIS — I129 Hypertensive chronic kidney disease with stage 1 through stage 4 chronic kidney disease, or unspecified chronic kidney disease: Secondary | ICD-10-CM | POA: Diagnosis not present

## 2018-07-15 DIAGNOSIS — M47812 Spondylosis without myelopathy or radiculopathy, cervical region: Secondary | ICD-10-CM | POA: Diagnosis not present

## 2018-08-07 ENCOUNTER — Other Ambulatory Visit: Payer: Self-pay

## 2018-08-07 DIAGNOSIS — J309 Allergic rhinitis, unspecified: Secondary | ICD-10-CM

## 2018-08-07 MED ORDER — LORATADINE 10 MG PO TABS: 10.0000 mg | ORAL_TABLET | Freq: Every day | ORAL | 11 refills | Status: DC

## 2018-11-17 DIAGNOSIS — N189 Chronic kidney disease, unspecified: Secondary | ICD-10-CM | POA: Diagnosis not present

## 2018-11-17 DIAGNOSIS — N2581 Secondary hyperparathyroidism of renal origin: Secondary | ICD-10-CM | POA: Diagnosis not present

## 2018-11-17 DIAGNOSIS — M1990 Unspecified osteoarthritis, unspecified site: Secondary | ICD-10-CM | POA: Diagnosis not present

## 2018-11-17 DIAGNOSIS — J45909 Unspecified asthma, uncomplicated: Secondary | ICD-10-CM | POA: Diagnosis not present

## 2018-11-17 DIAGNOSIS — E118 Type 2 diabetes mellitus with unspecified complications: Secondary | ICD-10-CM | POA: Diagnosis not present

## 2018-11-17 DIAGNOSIS — M47812 Spondylosis without myelopathy or radiculopathy, cervical region: Secondary | ICD-10-CM | POA: Diagnosis not present

## 2018-11-17 DIAGNOSIS — E1122 Type 2 diabetes mellitus with diabetic chronic kidney disease: Secondary | ICD-10-CM | POA: Diagnosis not present

## 2018-11-17 DIAGNOSIS — N183 Chronic kidney disease, stage 3 (moderate): Secondary | ICD-10-CM | POA: Diagnosis not present

## 2018-11-17 DIAGNOSIS — N400 Enlarged prostate without lower urinary tract symptoms: Secondary | ICD-10-CM | POA: Diagnosis not present

## 2018-11-17 DIAGNOSIS — M4800 Spinal stenosis, site unspecified: Secondary | ICD-10-CM | POA: Diagnosis not present

## 2018-11-17 DIAGNOSIS — E785 Hyperlipidemia, unspecified: Secondary | ICD-10-CM | POA: Diagnosis not present

## 2018-11-17 DIAGNOSIS — D631 Anemia in chronic kidney disease: Secondary | ICD-10-CM | POA: Diagnosis not present

## 2018-11-17 DIAGNOSIS — I129 Hypertensive chronic kidney disease with stage 1 through stage 4 chronic kidney disease, or unspecified chronic kidney disease: Secondary | ICD-10-CM | POA: Diagnosis not present

## 2019-03-09 ENCOUNTER — Other Ambulatory Visit: Payer: Self-pay

## 2019-03-09 DIAGNOSIS — E785 Hyperlipidemia, unspecified: Secondary | ICD-10-CM

## 2019-03-09 MED ORDER — ATORVASTATIN CALCIUM 40 MG PO TABS: 40.0000 mg | ORAL_TABLET | Freq: Every day | ORAL | 3 refills | Status: DC

## 2019-03-19 DIAGNOSIS — N183 Chronic kidney disease, stage 3 unspecified: Secondary | ICD-10-CM | POA: Diagnosis not present

## 2019-03-19 DIAGNOSIS — N2581 Secondary hyperparathyroidism of renal origin: Secondary | ICD-10-CM | POA: Diagnosis not present

## 2019-03-24 DIAGNOSIS — I129 Hypertensive chronic kidney disease with stage 1 through stage 4 chronic kidney disease, or unspecified chronic kidney disease: Secondary | ICD-10-CM | POA: Diagnosis not present

## 2019-03-24 DIAGNOSIS — E1122 Type 2 diabetes mellitus with diabetic chronic kidney disease: Secondary | ICD-10-CM | POA: Diagnosis not present

## 2019-03-24 DIAGNOSIS — N1832 Chronic kidney disease, stage 3b: Secondary | ICD-10-CM | POA: Diagnosis not present

## 2019-03-24 DIAGNOSIS — D631 Anemia in chronic kidney disease: Secondary | ICD-10-CM | POA: Diagnosis not present

## 2019-03-24 DIAGNOSIS — N2581 Secondary hyperparathyroidism of renal origin: Secondary | ICD-10-CM | POA: Diagnosis not present

## 2019-06-08 DIAGNOSIS — Z125 Encounter for screening for malignant neoplasm of prostate: Secondary | ICD-10-CM | POA: Diagnosis not present

## 2019-06-11 DIAGNOSIS — N401 Enlarged prostate with lower urinary tract symptoms: Secondary | ICD-10-CM | POA: Diagnosis not present

## 2019-08-13 ENCOUNTER — Ambulatory Visit: Payer: Medicare Other | Attending: Internal Medicine

## 2019-08-13 DIAGNOSIS — Z23 Encounter for immunization: Secondary | ICD-10-CM

## 2019-08-13 NOTE — Progress Notes (Signed)
   Covid-19 Vaccination Clinic  Name:  Anthony Moran    MRN: [id number] DOB: 1950/12/19  08/13/2019  [name] was observed post Covid-19 immunization for 15 minutes without incident. He was provided with Vaccine Information Sheet and instruction to access the V-Safe system.   [name] was instructed to call 911 with any severe reactions post vaccine: Marland Kitchen Difficulty breathing  . Swelling of face and throat  . A fast heartbeat  . A bad rash all over body  . Dizziness and weakness

## 2019-09-03 ENCOUNTER — Other Ambulatory Visit: Payer: Self-pay | Admitting: *Deleted

## 2019-09-03 DIAGNOSIS — J309 Allergic rhinitis, unspecified: Secondary | ICD-10-CM

## 2019-09-03 MED ORDER — LORATADINE 10 MG PO TABS: 10.0000 mg | ORAL_TABLET | Freq: Every day | ORAL | 11 refills | Status: DC

## 2019-09-09 ENCOUNTER — Ambulatory Visit: Payer: Medicare Other | Attending: Internal Medicine

## 2019-09-09 DIAGNOSIS — Z23 Encounter for immunization: Secondary | ICD-10-CM

## 2019-09-09 NOTE — Progress Notes (Signed)
   Covid-19 Vaccination Clinic  Name:  Anthony Moran    MRN: [id number] DOB: [birth year]  09/09/2019  Anthony Moran was observed post Covid-19 immunization for 15 minutes without incident. He was provided with Vaccine Information Sheet and instruction to access the V-Safe system.   Anthony Moran was instructed to call 911 with any severe reactions post vaccine: Marland Kitchen Difficulty breathing  . Swelling of face and throat  . A fast heartbeat  . A bad rash all over body  . Dizziness and weakness   Immunizations Administered    Name Date Dose VIS Date Route   Pfizer COVID-19 Vaccine 09/09/2019  3:02 PM 0.3 mL 05/22/2019 Intramuscular   Manufacturer: [place]   Lot: [id number]   [place]: [id number]

## 2019-09-30 ENCOUNTER — Other Ambulatory Visit: Payer: Self-pay

## 2019-09-30 ENCOUNTER — Ambulatory Visit (INDEPENDENT_AMBULATORY_CARE_PROVIDER_SITE_OTHER): Payer: Medicare Other | Admitting: Family Medicine

## 2019-09-30 ENCOUNTER — Encounter: Payer: Self-pay | Admitting: Family Medicine

## 2019-09-30 VITALS — BP 128/78 | HR 84 | Ht 70.0 in | Wt 195.0 lb

## 2019-09-30 DIAGNOSIS — E1169 Type 2 diabetes mellitus with other specified complication: Secondary | ICD-10-CM | POA: Diagnosis not present

## 2019-09-30 DIAGNOSIS — Z1211 Encounter for screening for malignant neoplasm of colon: Secondary | ICD-10-CM

## 2019-09-30 DIAGNOSIS — I10 Essential (primary) hypertension: Secondary | ICD-10-CM

## 2019-09-30 DIAGNOSIS — I152 Hypertension secondary to endocrine disorders: Secondary | ICD-10-CM

## 2019-09-30 DIAGNOSIS — R809 Proteinuria, unspecified: Secondary | ICD-10-CM

## 2019-09-30 DIAGNOSIS — L309 Dermatitis, unspecified: Secondary | ICD-10-CM | POA: Diagnosis not present

## 2019-09-30 DIAGNOSIS — E785 Hyperlipidemia, unspecified: Secondary | ICD-10-CM | POA: Diagnosis not present

## 2019-09-30 DIAGNOSIS — Z23 Encounter for immunization: Secondary | ICD-10-CM

## 2019-09-30 DIAGNOSIS — E1159 Type 2 diabetes mellitus with other circulatory complications: Secondary | ICD-10-CM

## 2019-09-30 DIAGNOSIS — E1121 Type 2 diabetes mellitus with diabetic nephropathy: Secondary | ICD-10-CM

## 2019-09-30 LAB — POCT GLYCOSYLATED HEMOGLOBIN (HGB A1C): HbA1c, POC (controlled diabetic range): 5.9 % (ref 0.0–7.0)

## 2019-09-30 MED ORDER — CLOBETASOL PROPIONATE 0.05 % EX CREA: 1.0000 "application " | TOPICAL_CREAM | Freq: Two times a day (BID) | CUTANEOUS | 1 refills | Status: DC | PRN

## 2019-09-30 NOTE — Patient Instructions (Signed)
We are checking your hemoglobin A1c for diabetes, kidney function, and cholesterol.   We are giving you pneumonia shot today.  We are referring you to get your colonoscopy.

## 2019-09-30 NOTE — Assessment & Plan Note (Signed)
Well-controlled.  Continue to monitor 6 months.

## 2019-09-30 NOTE — Assessment & Plan Note (Signed)
Diet-controlled. Blood pressure at goal. Already on statin. - Will check renal function - Cholesterol -Follow-up in 6 months

## 2019-09-30 NOTE — Progress Notes (Signed)
    SUBJECTIVE:   CHIEF COMPLAINT / HPI:   Patient presents for annual physical.  Diabetes mellitus type 2  DMT2 unchanged . Symptoms: none  . Diet controlled . Statin therapy: Atorvastatin 40 . BP Goal: < 140/ < 90. At goal? Yes  . lisinopril (generic)  HgA1C Trend:  Lab Results  Component Value Date   HGBA1C 5.9 09/30/2019   HGBA1C 5.8 01/31/2018   HGBA1C 6.2 (H) 07/25/2017  .  Renal Function Labs:  Lab Results  Component Value Date   MICROALBUR 150 07/25/2017   LDLCALC 50 07/25/2017   CREATININE 1.79 (H) 07/25/2017  .  Marland Kitchen The ASCVD Risk score Mikey Bussing DC Jr., et al., 2013) failed to calculate for the following reasons: .   The valid total cholesterol range is 130 to 320 mg/dL Diabetes Health Maintenance Due  Topic Date Due  . OPHTHALMOLOGY EXAM  01/31/2019  . FOOT EXAM  02/01/2019  . HEMOGLOBIN A1C  03/31/2020  .   Eczema.  Well-controlled with clobetasol ointment.  Health maintenance Need for colon cancer screening Needs pneumonia vaccine 23  PERTINENT  PMH / PSH:  Hyperlipidemia Diabetes mellitus CKD 3  OBJECTIVE:   BP 128/78   Pulse 84   Ht 5\' 10"  (1.778 m)   Wt 195 lb (88.5 kg)   BMI 27.98 kg/m    Gen: NAD, resting comfortably CV: RRR with no murmurs appreciated Pulm: NWOB, CTAB with no crackles, wheezes, or rhonchi GI: Soft, Nontender, Nondistended. 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

## 2019-10-01 ENCOUNTER — Encounter: Payer: Self-pay | Admitting: Family Medicine

## 2019-10-01 LAB — BASIC METABOLIC PANEL
BUN/Creatinine Ratio: 12 (ref 10–24)
BUN: 22 mg/dL (ref 8–27)
CO2: 22 mmol/L (ref 20–29)
Calcium: 9.4 mg/dL (ref 8.6–10.2)
Chloride: 105 mmol/L (ref 96–106)
Creatinine, Ser: 1.79 mg/dL — ABNORMAL HIGH (ref 0.76–1.27)
GFR calc Af Amer: 44 mL/min/{1.73_m2} — ABNORMAL LOW (ref >59)
GFR calc non Af Amer: 38 mL/min/{1.73_m2} — ABNORMAL LOW (ref >59)
Glucose: 108 mg/dL — ABNORMAL HIGH (ref 65–99)
Potassium: 4.4 mmol/L (ref 3.5–5.2)
Sodium: 142 mmol/L (ref 134–144)

## 2019-10-01 LAB — LIPID PANEL
Chol/HDL Ratio: 2 ratio (ref 0.0–5.0)
Cholesterol, Total: 138 mg/dL (ref 100–199)
HDL: 69 mg/dL (ref >39)
LDL Chol Calc (NIH): 58 mg/dL (ref 0–99)
Triglycerides: 50 mg/dL (ref 0–149)
VLDL Cholesterol Cal: 11 mg/dL (ref 5–40)

## 2019-10-06 ENCOUNTER — Encounter: Payer: Self-pay | Admitting: Gastroenterology

## 2019-11-04 ENCOUNTER — Other Ambulatory Visit: Payer: Self-pay

## 2019-11-04 ENCOUNTER — Ambulatory Visit (AMBULATORY_SURGERY_CENTER): Payer: Self-pay

## 2019-11-04 VITALS — Ht 70.0 in | Wt 197.8 lb

## 2019-11-04 DIAGNOSIS — Z86010 Personal history of colonic polyps: Secondary | ICD-10-CM

## 2019-11-04 NOTE — Progress Notes (Signed)
No allergies to soy or egg Pt is not on blood thinners or diet pills Denies issues with sedation/intubation Denies atrial flutter/fib Denies constipation   Emmi instructions given to pt  Pt is aware of Covid safety and care partner requirements.  

## 2019-11-05 DIAGNOSIS — I129 Hypertensive chronic kidney disease with stage 1 through stage 4 chronic kidney disease, or unspecified chronic kidney disease: Secondary | ICD-10-CM | POA: Diagnosis not present

## 2019-11-05 DIAGNOSIS — N2581 Secondary hyperparathyroidism of renal origin: Secondary | ICD-10-CM | POA: Diagnosis not present

## 2019-11-05 DIAGNOSIS — D631 Anemia in chronic kidney disease: Secondary | ICD-10-CM | POA: Diagnosis not present

## 2019-11-05 DIAGNOSIS — N1832 Chronic kidney disease, stage 3b: Secondary | ICD-10-CM | POA: Diagnosis not present

## 2019-11-17 ENCOUNTER — Encounter: Payer: Medicare Other | Admitting: Gastroenterology

## 2019-11-24 ENCOUNTER — Encounter: Payer: Self-pay | Admitting: Gastroenterology

## 2019-11-24 ENCOUNTER — Other Ambulatory Visit: Payer: Self-pay

## 2019-11-24 ENCOUNTER — Ambulatory Visit (AMBULATORY_SURGERY_CENTER): Payer: Medicare Other | Admitting: Gastroenterology

## 2019-11-24 VITALS — BP 126/74 | HR 55 | Temp 98.0°F | Resp 20 | Ht 70.0 in | Wt 197.0 lb

## 2019-11-24 DIAGNOSIS — Z86010 Personal history of colonic polyps: Secondary | ICD-10-CM

## 2019-11-24 MED ORDER — SODIUM CHLORIDE 0.9 % IV SOLN: 500.0000 mL | Freq: Once | INTRAVENOUS | Status: DC

## 2019-11-24 NOTE — Progress Notes (Signed)
A/ox3, pleased with MAC, report to RN 

## 2019-11-24 NOTE — Op Note (Addendum)
Anthony Moran Procedure Date: 11/24/2019 9:25 AM MRN: [id number] Endoscopist: Milus Banister , MD Age: 69 Referring MD:  Date of Birth: November 12, 1950 Gender: Male Account #: [id number] Procedure:                Colonoscopy Indications:              High risk colon cancer surveillance: Personal                            history of colonic polyps; Colonoscopy 2013 single                            subCM adenoma removed Medicines:                Monitored Anesthesia Care Procedure:                Pre-Anesthesia Assessment:                           - Prior to the procedure, a History and Physical                            was performed, and patient medications and                            allergies were reviewed. The patient's tolerance of                            previous anesthesia was also reviewed. The risks                            and benefits of the procedure and the sedation                            options and risks were discussed with the patient.                            All questions were answered, and informed consent                            was obtained. Prior Anticoagulants: The patient has                            taken no previous anticoagulant or antiplatelet                            agents. ASA Grade Assessment: II - A patient with                            mild systemic disease. After reviewing the risks                            and benefits, the patient was deemed in  satisfactory condition to undergo the procedure.                           After obtaining informed consent, the colonoscope                            was passed under direct vision. Throughout the                            procedure, the patient's blood pressure, pulse, and                            oxygen saturations were monitored continuously. The                            Colonoscope was introduced through the  anus and                            advanced to the the cecum, identified by                            appendiceal orifice and ileocecal valve. The                            colonoscopy was performed without difficulty. The                            patient tolerated the procedure well. The quality                            of the bowel preparation was good. The ileocecal                            valve, appendiceal orifice, and rectum were                            photographed. Scope In: 9:36:04 AM Scope Out: 9:48:29 AM Scope Withdrawal Time: 0 hours 7 minutes 23 seconds  Total Procedure Duration: 0 hours 12 minutes 25 seconds  Findings:                 Internal hemorrhoids were found. The hemorrhoids                            were small.                           Left sided diverticulosis.                           The exam was otherwise without abnormality on                            direct and retroflexion views. Complications:            No immediate complications. Estimated blood loss:  None. Estimated Blood Loss:     Estimated blood loss: none. Impression:               - Internal hemorrhoids.                           - Diverticulosis.                           - The examination was otherwise normal on direct                            and retroflexion views.                           - No polyps or cancers. Recommendation:           - Patient has a contact number available for                            emergencies. The signs and symptoms of potential                            delayed complications were discussed with the                            patient. Return to normal activities tomorrow.                            Written discharge instructions were provided to the                            patient.                           - Resume previous diet.                           - Recall colonoscopy in 10 years for routine                             screening.                           - Continue present medications. Milus Banister, MD 11/24/2019 9:51:23 AM This report has been signed electronically.

## 2019-11-24 NOTE — Progress Notes (Signed)
Pt's states no medical or surgical changes since previsit or office visit. 

## 2019-11-24 NOTE — Patient Instructions (Signed)
YOU HAD AN ENDOSCOPIC PROCEDURE TODAY AT THE [facility]:   Refer to the procedure report that was given to you for any specific questions about what was found during the examination.  If the procedure report does not answer your questions, please call your gastroenterologist to clarify.  If you requested that your care partner not be given the details of your procedure findings, then the procedure report has been included in a sealed envelope for you to review at your convenience later.  YOU SHOULD EXPECT: Some feelings of bloating in the abdomen. Passage of more gas than usual.  Walking can help get rid of the air that was put into your GI tract during the procedure and reduce the bloating. If you had a lower endoscopy (such as a colonoscopy or flexible sigmoidoscopy) you may notice spotting of blood in your stool or on the toilet paper. If you underwent a bowel prep for your procedure, you may not have a normal bowel movement for a few days.  Please Note:  You might notice some irritation and congestion in your nose or some drainage.  This is from the oxygen used during your procedure.  There is no need for concern and it should clear up in a day or so.  SYMPTOMS TO REPORT IMMEDIATELY:   Following lower endoscopy (colonoscopy or flexible sigmoidoscopy):  Excessive amounts of blood in the stool  Significant tenderness or worsening of abdominal pains  Swelling of the abdomen that is new, acute  Fever of 100F or higher  For urgent or emergent issues, a gastroenterologist can be reached at any hour by calling [phone]. Do not use MyChart messaging for urgent concerns.    DIET:  We do recommend a small meal at first, but then you may proceed to your regular diet.  Drink plenty of fluids but you should avoid alcoholic beverages for 24 hours.  ACTIVITY:  You should plan to take it easy for the rest of today and you should NOT DRIVE or use heavy machinery until tomorrow (because  of the sedation medicines used during the test).    FOLLOW UP: Our staff will call the number listed on your records 48-72 hours following your procedure to check on you and address any questions or concerns that you may have regarding the information given to you following your procedure. If we do not reach you, we will leave a message.  We will attempt to reach you two times.  During this call, we will ask if you have developed any symptoms of COVID 19. If you develop any symptoms (ie: fever, flu-like symptoms, shortness of breath, cough etc.) before then, please call [phone].  If you test positive for Covid 19 in the 2 weeks post procedure, please call and report this information to us.    If any biopsies were taken you will be contacted by phone or by letter within the next 1-3 weeks.  Please call us at [phone] if you have not heard about the biopsies in 3 weeks.    SIGNATURES/CONFIDENTIALITY: You and/or your care partner have signed paperwork which will be entered into your electronic medical record.  These signatures attest to the fact that that the information above on your After Visit Summary has been reviewed and is understood.  Full responsibility of the confidentiality of this discharge information lies with you and/or your care-partner. 

## 2019-11-26 ENCOUNTER — Telehealth: Payer: Self-pay

## 2019-11-26 NOTE — Telephone Encounter (Signed)
Left message on 2nd follow up call. 

## 2019-11-26 NOTE — Telephone Encounter (Signed)
First post procedure follow up call, no answer 

## 2020-04-09 ENCOUNTER — Telehealth: Payer: Self-pay | Admitting: Family Medicine

## 2020-04-09 NOTE — Telephone Encounter (Signed)
**  After Hours/ Emergency Line Call**  Received a page to call [employer] . Wife reports her husband needs a 3 day prescription for Atorvastatin.   Will forward request to PCP.  Lyndee Hensen, DO PGY-2, [facility] 04/09/2020 5:41 PM

## 2020-04-12 ENCOUNTER — Other Ambulatory Visit: Payer: Self-pay | Admitting: Family Medicine

## 2020-04-12 DIAGNOSIS — E785 Hyperlipidemia, unspecified: Secondary | ICD-10-CM

## 2020-04-12 MED ORDER — ATORVASTATIN CALCIUM 40 MG PO TABS: 40.0000 mg | ORAL_TABLET | Freq: Every day | ORAL | 3 refills | Status: DC

## 2020-06-02 DIAGNOSIS — N401 Enlarged prostate with lower urinary tract symptoms: Secondary | ICD-10-CM | POA: Diagnosis not present

## 2020-06-09 DIAGNOSIS — N5201 Erectile dysfunction due to arterial insufficiency: Secondary | ICD-10-CM | POA: Diagnosis not present

## 2020-06-09 DIAGNOSIS — N401 Enlarged prostate with lower urinary tract symptoms: Secondary | ICD-10-CM | POA: Diagnosis not present

## 2020-06-09 DIAGNOSIS — R351 Nocturia: Secondary | ICD-10-CM | POA: Diagnosis not present

## 2020-07-08 DIAGNOSIS — D631 Anemia in chronic kidney disease: Secondary | ICD-10-CM | POA: Diagnosis not present

## 2020-07-08 DIAGNOSIS — I129 Hypertensive chronic kidney disease with stage 1 through stage 4 chronic kidney disease, or unspecified chronic kidney disease: Secondary | ICD-10-CM | POA: Diagnosis not present

## 2020-07-08 DIAGNOSIS — N2581 Secondary hyperparathyroidism of renal origin: Secondary | ICD-10-CM | POA: Diagnosis not present

## 2020-07-08 DIAGNOSIS — N1832 Chronic kidney disease, stage 3b: Secondary | ICD-10-CM | POA: Diagnosis not present

## 2020-08-07 ENCOUNTER — Other Ambulatory Visit: Payer: Self-pay | Admitting: Urology

## 2020-08-29 ENCOUNTER — Other Ambulatory Visit: Payer: Self-pay

## 2020-08-29 DIAGNOSIS — J309 Allergic rhinitis, unspecified: Secondary | ICD-10-CM

## 2020-08-29 MED ORDER — LORATADINE 10 MG PO TABS: 10.0000 mg | ORAL_TABLET | Freq: Every day | ORAL | 11 refills | Status: DC

## 2020-11-24 ENCOUNTER — Ambulatory Visit (INDEPENDENT_AMBULATORY_CARE_PROVIDER_SITE_OTHER): Payer: Medicare Other

## 2020-11-24 ENCOUNTER — Other Ambulatory Visit: Payer: Self-pay

## 2020-11-24 VITALS — BP 134/80 | HR 78 | Ht 68.0 in | Wt 188.0 lb

## 2020-11-24 DIAGNOSIS — Z0000 Encounter for general adult medical examination without abnormal findings: Secondary | ICD-10-CM | POA: Diagnosis not present

## 2020-11-24 NOTE — Patient Instructions (Signed)
You spoke to [name], [place] for your annual wellness visit.  We discussed goals:   Goals      Exercise 4x per week (30 min per time)     Patient already walks 3x per week. Patient would like to add one more day.         We also discussed recommended health maintenance. Please call our office and schedule a visit. As discussed, you are due for:  Health Maintenance  Topic Date Due   Zoster Vaccines- Shingrix (1 of 2) Never done   OPHTHALMOLOGY EXAM  01/31/2019   FOOT EXAM  02/01/2019   COVID-19 Vaccine (3 - Booster for Pfizer series) 02/09/2020   HEMOGLOBIN A1C  03/31/2020   INFLUENZA VACCINE  01/09/2021   TETANUS/TDAP  04/13/2026   COLONOSCOPY (Pts 45-58yrs Insurance coverage will need to be confirmed)  11/23/2029   Hepatitis C Screening  Completed   PNA vac Low Risk Adult  Completed   HPV VACCINES  Aged Out       Our clinic's number is [phone]. Please call with questions or concerns about what we discussed today.

## 2020-11-24 NOTE — Progress Notes (Addendum)
Subjective:   Anthony Moran is a 70 y.o. male who presents for Medicare Annual/Subsequent preventive examination.  Review of Systems: Defer to PCP.  Cardiac Risk Factors include: advanced age (>24men, >31 women);hypertension  Objective:    Vitals: BP 134/80   Pulse 78   Ht 5\' 8"  (1.727 m)   Wt 188 lb (85.3 kg)   SpO2 98%   BMI 28.59 kg/m   Body mass index is 28.59 kg/m.  Advanced Directives 11/24/2020 09/30/2019 10/25/2017 07/25/2017 11/08/2016 10/22/2016 06/25/2016  Does Patient Have a Medical Advance Directive? No No No No No No No  Type of Advance Directive - - - - - - -  Would patient like information on creating a medical advance directive? Yes (MAU/Ambulatory/Procedural Areas - Information given) No - Patient declined No - Patient declined No - Patient declined No - Patient declined No - Patient declined No - Patient declined   Tobacco Social History   Tobacco Use  Smoking Status Never  Smokeless Tobacco Never     Clinical Intake:  Pre-visit preparation completed: Yes  Pain Score: 2   How often do you need to have someone help you when you read instructions, pamphlets, or other written materials from your doctor or pharmacy?: 1 - Never What is the last grade level you completed in school?: College  Past Medical History:  Diagnosis Date   Allergy    seasonal   Anxiety    Arthritis    Asthma    Cervical spondylosis without myelopathy    Chronic kidney disease    Chk 3 stage   Depression    Diabetes mellitus    Hypertension    Lumbar post-laminectomy syndrome    Lumbosacral neuritis    Mononeuritis multiplex    Pneumonia    Spinal stenosis in cervical region    Past Surgical History:  Procedure Laterality Date   LUMBAR FUSION     L 4-5, L5 si   Family History  Problem Relation Age of Onset   Hypertension Mother    Prostate cancer Father    Hypertension Sister    Breast cancer Maternal Aunt    Colon cancer Neg Hx    Colon polyps Neg Hx     Esophageal cancer Neg Hx    Rectal cancer Neg Hx    Stomach cancer Neg Hx    Social History   Socioeconomic History   Marital status: Married    Spouse name: Legrand Rams   Number of children: 3   Years of education: 16   Highest education level: Bachelor's degree (e.g., BA, AB, BS)  Occupational History    Employer: UNEMPLOYED  Tobacco Use   Smoking status: Never   Smokeless tobacco: Never  Vaping Use   Vaping Use: Never used  Substance and Sexual Activity   Alcohol use: Never   Drug use: No   Sexual activity: Yes  Other Topics Concern   Not on file  Social History Narrative   Patient lives with his wife in [place].    Patient enjoys spending time at home with his wife during "covid times."   Patient does yoga for his back pain weekly.    Patient likes to read.    Social Determinants of Health   Financial Resource Strain: Low Risk    Difficulty of Paying Living Expenses: Not hard at all  Food Insecurity: No Food Insecurity   Worried About Charity fundraiser in the Last Year: Never true   Ran Out of  Food in the Last Year: Never true  Transportation Needs: No Transportation Needs   Lack of Transportation (Medical): No   Lack of Transportation (Non-Medical): No  Physical Activity: Insufficiently Active   Days of Exercise per Week: 3 days   Minutes of Exercise per Session: 40 min  Stress: No Stress Concern Present   Feeling of Stress : Only a little  Social Connections: Moderately Integrated   Frequency of Communication with Friends and Family: More than three times a week   Frequency of Social Gatherings with Friends and Family: More than three times a week   Attends Religious Services: More than 4 times per year   Active Member of [employer] or Organizations: No   Attends Archivist Meetings: Never   Marital Status: Married   Outpatient Encounter Medications as of 11/24/2020  Medication Sig   Acetaminophen (TYLENOL PO) Take by mouth.   amLODipine (NORVASC) 10  MG tablet Take 0.5 tablets (5 mg total) by mouth daily.   aspirin EC 81 MG tablet Take 1 tablet (81 mg total) by mouth daily.   atorvastatin (LIPITOR) 40 MG tablet Take 1 tablet (40 mg total) by mouth daily.   calcitRIOL (ROCALTROL) 0.25 MCG capsule Take 0.25 mcg by mouth daily.   Cholecalciferol (VITAMIN D3) 30 MCG/15ML LIQD Take by mouth.   clobetasol cream (TEMOVATE) 5.78 % Apply 1 application topically 2 (two) times daily as needed (rash).   fluticasone (FLONASE) 50 MCG/ACT nasal spray Place 2 sprays into both nostrils daily.   lisinopril (PRINIVIL,ZESTRIL) 5 MG tablet Take 1 tablet (5 mg total) by mouth daily.   loratadine (CLARITIN) 10 MG tablet Take 1 tablet (10 mg total) by mouth daily.   tamsulosin (FLOMAX) 0.4 MG CAPS capsule TAKE 1 CAPSULE BY MOUTH EVERY DAY   VITAMIN D PO Take by mouth.   [DISCONTINUED] montelukast (SINGULAIR) 10 MG tablet Take 10 mg by mouth daily.   No facility-administered encounter medications on file as of 11/24/2020.   Activities of Daily Living In your present state of health, do you have any difficulty performing the following activities: 11/24/2020  Hearing? N  Vision? N  Difficulty concentrating or making decisions? N  Walking or climbing stairs? Y  Dressing or bathing? N  Doing errands, shopping? N  Preparing Food and eating ? N  Using the Toilet? N  In the past six months, have you accidently leaked urine? N  Do you have problems with loss of bowel control? N  Managing your Medications? N  Managing your Finances? N  Housekeeping or managing your Housekeeping? N  Some recent data might be hidden   Patient Care Team: Lurline Del, DO as PCP - General (Family Medicine)   Assessment:   This is a routine wellness examination for Anthony Moran.  Exercise Activities and Dietary recommendations Current Exercise Habits: Home exercise routine, Type of exercise: yoga;walking, Time (Minutes): 40, Frequency (Times/Week): 3, Weekly Exercise (Minutes/Week):  120, Exercise limited by: orthopedic condition(s)   Goals      Exercise 4x per week (30 min per time)     Patient already walks 3x per week. Patient would like to add one more day.         Fall Risk Fall Risk  11/24/2020 09/30/2019 10/25/2017 07/25/2017 06/01/2016  Falls in the past year? 0 0 No No No  Risk for fall due to : No Fall Risks - - - -  Follow up Falls prevention discussed - - - -   Is the patient's  home free of loose throw rugs in walkways, pet beds, electrical cords, etc?   yes      Grab bars in the bathroom? yes      Handrails on the stairs?   yes      Adequate lighting?   yes  Patient rating of health (0-10): 10  Depression Screen PHQ 2/9 Scores 11/24/2020 09/30/2019 10/25/2017 07/25/2017  PHQ - 2 Score 0 0 0 0  PHQ- 9 Score - - - -   Cognitive Function  6CIT Screen 11/24/2020  What Year? 0 points  What month? 0 points  What time? 0 points  Count back from 20 0 points  Months in reverse 0 points  Repeat phrase 0 points  Total Score 0   Immunization History  Administered Date(s) Administered   Influenza,inj,Quad PF,6+ Mos 07/25/2017   PFIZER(Purple Top)SARS-COV-2 Vaccination 08/13/2019, 09/09/2019   Pneumococcal Conjugate-13 07/25/2017   Pneumococcal Polysaccharide-23 09/30/2019   Tdap 04/13/2016   Screening Tests Health Maintenance  Topic Date Due   Zoster Vaccines- Shingrix (1 of 2) Never done   OPHTHALMOLOGY EXAM  01/31/2019   FOOT EXAM  02/01/2019   COVID-19 Vaccine (3 - Booster for Pfizer series) 02/09/2020   HEMOGLOBIN A1C  03/31/2020   INFLUENZA VACCINE  01/09/2021   TETANUS/TDAP  04/13/2026   COLONOSCOPY (Pts 45-44yrs Insurance coverage will need to be confirmed)  11/23/2029   Hepatitis C Screening  Completed   PNA vac Low Risk Adult  Completed   HPV VACCINES  Aged Out   Cancer Screenings: Lung: Low Dose CT Chest recommended if Age 59-80 years, 30 pack-year currently smoking OR have quit w/in 15years. Patient does not qualify. Colorectal:  UTD- Due 2031  Additional Screenings: Hepatitis Screening: Completed   Plan:  PCP apt scheduled for 6/29 @830am . Fill out the advance directive packet I gave you.  Add one more walk in!  I have personally reviewed and noted the following in the patient's chart:   Medical and social history Use of alcohol, tobacco or illicit drugs  Current medications and supplements Functional ability and status Nutritional status Physical activity Advanced directives List of other physicians Hospitalizations, surgeries, and ER visits in previous 12 months Vitals Screenings to include cognitive, depression, and falls Referrals and appointments  In addition, I have reviewed and discussed with patient certain preventive protocols, quality metrics, and best practice recommendations. A written personalized care plan for preventive services as well as general preventive health recommendations were provided to patient.  [name], [place]  11/24/2020   I have reviewed this visit and agree with the documentation.  Dorris Singh, MD  Family Medicine Teaching Service

## 2020-12-05 NOTE — Progress Notes (Signed)
    SUBJECTIVE:   CHIEF COMPLAINT / HPI:   Hypertension: Patient is a 70 y.o. male who present today for follow up of hypertension.   Patient endorses no problems  Home medications include: Amlodipine 10 mg, lisinopril 5 mg  Patient endorses taking these medications as prescribed.\  Most recent creatinine trend:  Lab Results  Component Value Date   CREATININE 1.79 (H) 09/30/2019   CREATININE 1.79 (H) 07/25/2017   CREATININE 2.04 (H) 06/01/2016   Patient does not check blood pressure at home.  Patient has not had a BMP in the past 1 year.  Diabetic Follow Up: Patient is a 70 y.o. male who present today for diabetic follow up.   Patient endorses no problems  Home medications include: Diet controlled Most recent A1Cs:  Lab Results  Component Value Date   HGBA1C 5.8 12/07/2020   HGBA1C 5.9 09/30/2019   HGBA1C 5.8 01/31/2018   Last Microalbumin, LDL, Creatinine: Lab Results  Component Value Date   MICROALBUR 150 07/25/2017   LDLCALC 58 09/30/2019   CREATININE 1.79 (H) 09/30/2019   Patient does not check blood glucose on a regular basis.  Patient is not up to date on diabetic eye. Patient is not up to date on diabetic foot exam.  Bilateral foot edema Started about 8 months or a year ago. He has no itching or pain but has noticed his feet seem a bit darker in color in the region of the swelling. Improves with elevating feet.   PERTINENT  PMH / PSH: Type 2 diabetes  OBJECTIVE:   BP 129/60   Pulse 70   Ht 5\' 8"  (1.727 m)   Wt 188 lb 12.8 oz (85.6 kg)   SpO2 97%   BMI 28.71 kg/m    Diabetic foot exam was performed.  No deformities or other abnormal visual findings.  Posterior tibialis and dorsalis pulse intact bilaterally.  Intact to touch and monofilament testing bilaterally.  Sensation: mildly decreased in heel bilaterally    General: NAD, pleasant, able to participate in exam Cardiac: RRR, no murmurs. Respiratory: CTAB, normal effort, No wheezes,  rales or rhonchi Abdomen: Bowel sounds present, nontender, nondistended. 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  A1c today 5.8.  Foot exam performed.  Recommend yearly diabetic eye exam.  Hyperlipidemia associated with type 2 diabetes mellitus ([place]) Continues on his atorvastatin.  We will check a lipid panel today.  Hypertension associated with diabetes ([place]) Well-controlled blood pressure on amlodipine and lisinopril.  Patient is experiencing a little bit of edema in the bilateral feet.  He is not having any shortness of breath.  I believe this is most likely due to his amlodipine.  I did discuss with him the spouse about the option of reducing the amlodipine and increasing his lisinopril but at this time he wants to hold off.    Lurline Del, [place]

## 2020-12-07 ENCOUNTER — Ambulatory Visit (INDEPENDENT_AMBULATORY_CARE_PROVIDER_SITE_OTHER): Payer: Medicare Other | Admitting: Family Medicine

## 2020-12-07 ENCOUNTER — Other Ambulatory Visit: Payer: Self-pay

## 2020-12-07 ENCOUNTER — Encounter: Payer: Self-pay | Admitting: Family Medicine

## 2020-12-07 VITALS — BP 129/60 | HR 70 | Ht 68.0 in | Wt 188.8 lb

## 2020-12-07 DIAGNOSIS — E1159 Type 2 diabetes mellitus with other circulatory complications: Secondary | ICD-10-CM

## 2020-12-07 DIAGNOSIS — I152 Hypertension secondary to endocrine disorders: Secondary | ICD-10-CM | POA: Diagnosis not present

## 2020-12-07 DIAGNOSIS — E785 Hyperlipidemia, unspecified: Secondary | ICD-10-CM | POA: Diagnosis not present

## 2020-12-07 DIAGNOSIS — E119 Type 2 diabetes mellitus without complications: Secondary | ICD-10-CM | POA: Diagnosis not present

## 2020-12-07 DIAGNOSIS — E1121 Type 2 diabetes mellitus with diabetic nephropathy: Secondary | ICD-10-CM

## 2020-12-07 DIAGNOSIS — E1169 Type 2 diabetes mellitus with other specified complication: Secondary | ICD-10-CM

## 2020-12-07 LAB — POCT GLYCOSYLATED HEMOGLOBIN (HGB A1C): HbA1c, POC (controlled diabetic range): 5.8 % (ref 0.0–7.0)

## 2020-12-07 NOTE — Assessment & Plan Note (Signed)
Continues on his atorvastatin.  We will check a lipid panel today.

## 2020-12-07 NOTE — Patient Instructions (Signed)
Today we checked her A1c and it was 5.8.  We do not need to make any changes.  For the swelling in your feet I believe this is due to your amlodipine.  Please let me know if you would like to try adjusting your medications.  We are going to check your kidney function today and I will call you with these results.  If you develop any trouble breathing, chest pains or other concerning symptoms please let us know.  We are also going to check your cholesterol today.  I would like for you to get your diabetic eye exam sometime in the next few months.

## 2020-12-07 NOTE — Assessment & Plan Note (Signed)
Well-controlled blood pressure on amlodipine and lisinopril.  Patient is experiencing a little bit of edema in the bilateral feet.  He is not having any shortness of breath.  I believe this is most likely due to his amlodipine.  I did discuss with him the spouse about the option of reducing the amlodipine and increasing his lisinopril but at this time he wants to hold off.

## 2020-12-07 NOTE — Assessment & Plan Note (Signed)
Diet controlled.  A1c today 5.8.  Foot exam performed.  Recommend yearly diabetic eye exam.

## 2020-12-08 ENCOUNTER — Encounter: Payer: Self-pay | Admitting: Family Medicine

## 2020-12-08 DIAGNOSIS — N1832 Chronic kidney disease, stage 3b: Secondary | ICD-10-CM | POA: Insufficient documentation

## 2020-12-08 DIAGNOSIS — N183 Chronic kidney disease, stage 3 unspecified: Secondary | ICD-10-CM | POA: Insufficient documentation

## 2020-12-08 LAB — BASIC METABOLIC PANEL
BUN/Creatinine Ratio: 9 — ABNORMAL LOW (ref 10–24)
BUN: 16 mg/dL (ref 8–27)
CO2: 24 mmol/L (ref 20–29)
Calcium: 9.1 mg/dL (ref 8.6–10.2)
Chloride: 105 mmol/L (ref 96–106)
Creatinine, Ser: 1.76 mg/dL — ABNORMAL HIGH (ref 0.76–1.27)
Glucose: 105 mg/dL — ABNORMAL HIGH (ref 65–99)
Potassium: 4 mmol/L (ref 3.5–5.2)
Sodium: 143 mmol/L (ref 134–144)
eGFR: 42 mL/min/{1.73_m2} — ABNORMAL LOW (ref >59)

## 2020-12-08 LAB — LIPID PANEL
Chol/HDL Ratio: 1.8 ratio (ref 0.0–5.0)
Cholesterol, Total: 132 mg/dL (ref 100–199)
HDL: 73 mg/dL (ref >39)
LDL Chol Calc (NIH): 48 mg/dL (ref 0–99)
Triglycerides: 45 mg/dL (ref 0–149)
VLDL Cholesterol Cal: 11 mg/dL (ref 5–40)

## 2021-03-30 ENCOUNTER — Ambulatory Visit (INDEPENDENT_AMBULATORY_CARE_PROVIDER_SITE_OTHER): Payer: Medicare Other

## 2021-03-30 ENCOUNTER — Other Ambulatory Visit: Payer: Self-pay

## 2021-03-30 DIAGNOSIS — Z23 Encounter for immunization: Secondary | ICD-10-CM

## 2021-03-30 NOTE — Progress Notes (Signed)
Patient presents to nurse clinic for flu vaccination and COVID booster.   Patient tolerated injections well.   Talbot Grumbling, RN

## 2021-04-02 ENCOUNTER — Other Ambulatory Visit: Payer: Self-pay | Admitting: Family Medicine

## 2021-04-02 DIAGNOSIS — E785 Hyperlipidemia, unspecified: Secondary | ICD-10-CM

## 2021-04-05 DIAGNOSIS — N1832 Chronic kidney disease, stage 3b: Secondary | ICD-10-CM | POA: Diagnosis not present

## 2021-04-11 DIAGNOSIS — I129 Hypertensive chronic kidney disease with stage 1 through stage 4 chronic kidney disease, or unspecified chronic kidney disease: Secondary | ICD-10-CM | POA: Diagnosis not present

## 2021-04-11 DIAGNOSIS — D631 Anemia in chronic kidney disease: Secondary | ICD-10-CM | POA: Diagnosis not present

## 2021-04-11 DIAGNOSIS — N1832 Chronic kidney disease, stage 3b: Secondary | ICD-10-CM | POA: Diagnosis not present

## 2021-04-11 DIAGNOSIS — N2581 Secondary hyperparathyroidism of renal origin: Secondary | ICD-10-CM | POA: Diagnosis not present

## 2021-05-24 DIAGNOSIS — N401 Enlarged prostate with lower urinary tract symptoms: Secondary | ICD-10-CM | POA: Diagnosis not present

## 2021-05-31 DIAGNOSIS — R351 Nocturia: Secondary | ICD-10-CM | POA: Diagnosis not present

## 2021-05-31 DIAGNOSIS — N401 Enlarged prostate with lower urinary tract symptoms: Secondary | ICD-10-CM | POA: Diagnosis not present

## 2021-05-31 DIAGNOSIS — R972 Elevated prostate specific antigen [PSA]: Secondary | ICD-10-CM | POA: Diagnosis not present

## 2021-08-25 ENCOUNTER — Other Ambulatory Visit: Payer: Self-pay | Admitting: Family Medicine

## 2021-08-25 DIAGNOSIS — J309 Allergic rhinitis, unspecified: Secondary | ICD-10-CM

## 2021-09-18 NOTE — Progress Notes (Signed)
? ? ?  SUBJECTIVE:  ? ?CHIEF COMPLAINT / HPI:  ? ?Nasal drainage  watery eyes: ?Has been going on about a month. He has had some times where it has woken up from sleep due to the drainage. No fevers. He has had sneezing and coughing. He only uses flonase on occasion due to some nose bleeds. He uses claritin on occasion. ? ?Diet-controlled type 2 diabetes: ?A1c's have been in the fives.  He did have an A1c of 8.0 in 2017. He endorses no problems or concerns with this today. ? ?PERTINENT  PMH / PSH:  ? ?OBJECTIVE:  ? ?BP 118/70   Pulse 69   Temp 98.3 ?F (36.8 ?C)   Wt 181 lb 9.6 oz (82.4 kg)   SpO2 99%   BMI 27.61 kg/m?   ? ?General: NAD, pleasant, able to participate in exam ?HEENT: No pharyngeal erythema, boggy turbinates present in bilateral naris ?Cardiac: RRR, no murmurs. ?Respiratory: CTAB, normal effort, No wheezes, rales or rhonchi ?Skin: warm and dry, no rashes noted ? ?ASSESSMENT/PLAN:  ? ?Seasonal allergies ?Assessment: 71 y.o. male with worsening seasonal allergies.  Has not been taking his Claritin daily and is stopped using Flonase because he had a few nosebleeds.  He has had symptoms for about a month.  No shortness of breath but has a cough and nasal drainage which sometimes he feels drained and throat at night.  Physical exam is reassuring and suggestive of seasonal allergies.  Doubt any significant infection particular given the timeline.  Discussed using Claritin daily.  Showed how to correctly use Flonase to minimize nosebleeds.  He is going to restart this and use it daily.  He is going to follow-up in 2 weeks if symptoms do not improve. ? ?Type 2 diabetes: ?Diet controlled.  A1c today of 5.5.  Recommend recheck in about 6 months.   ? ?He is going to follow-up in about 2-3 months for lipid panel and lab work.  He continues to follow with nephrology every 6 months for CKD. ? ?Lurline Del, DO ?[place]  ? ?

## 2021-09-19 ENCOUNTER — Encounter: Payer: Self-pay | Admitting: Family Medicine

## 2021-09-19 ENCOUNTER — Ambulatory Visit (INDEPENDENT_AMBULATORY_CARE_PROVIDER_SITE_OTHER): Payer: Medicare Other | Admitting: Family Medicine

## 2021-09-19 VITALS — BP 118/70 | HR 69 | Temp 98.3°F | Wt 181.6 lb

## 2021-09-19 DIAGNOSIS — J302 Other seasonal allergic rhinitis: Secondary | ICD-10-CM

## 2021-09-19 DIAGNOSIS — E1121 Type 2 diabetes mellitus with diabetic nephropathy: Secondary | ICD-10-CM

## 2021-09-19 LAB — POCT GLYCOSYLATED HEMOGLOBIN (HGB A1C): Hemoglobin A1C: 5.5 % (ref 4.0–5.6)

## 2021-09-19 NOTE — Patient Instructions (Signed)
1C today is excellent at 5.5.  Keep up the great work. ? ?For your nasal drainage and watery eyes I think the symptoms are due to seasonal allergies.  I recommend using the Claritin daily.  You can use Flonase with 2 sprays in each side of the nose daily.  Please give this at least 1 to 2 weeks to show maximum effect.  If you do not have improvement in the next 2 weeks please follow-up.  If you develop fevers, shortness of breath, or other symptoms please follow-up sooner. ?

## 2021-10-16 ENCOUNTER — Ambulatory Visit (INDEPENDENT_AMBULATORY_CARE_PROVIDER_SITE_OTHER): Payer: Medicare Other | Admitting: Family Medicine

## 2021-10-16 ENCOUNTER — Encounter: Payer: Self-pay | Admitting: Family Medicine

## 2021-10-16 VITALS — BP 123/83 | HR 74 | Ht 68.0 in | Wt 182.4 lb

## 2021-10-16 DIAGNOSIS — J302 Other seasonal allergic rhinitis: Secondary | ICD-10-CM | POA: Diagnosis not present

## 2021-10-16 NOTE — Progress Notes (Signed)
? ? ?  SUBJECTIVE:  ? ?CHIEF COMPLAINT / HPI:  ? ?Seasonal allergies: ?Has been using flonase daily and claritin daily but still notes some nasal drainage at night and watery eyes. He has changed his filters recently. He noticed it is worse when he runs the [facility]. He has been changing the air filter monthly. He states he can smell smoke from his neighbors smoking sometimes as well which may be contributing.  He states that after last appointment he did stop using the Flonase nasal spray after 2 weeks but continues using the Claritin. ? ?PERTINENT  PMH / PSH: None relevant ? ?OBJECTIVE:  ? ?BP 123/83   Pulse 74   Ht '5\' 8"'$  (1.727 m)   Wt 182 lb 6 oz (82.7 kg)   SpO2 100%   BMI 27.73 kg/m?   ? ?General: NAD, pleasant, able to participate in exam ?HEENT: Boggy nasal turbinates bilaterally.  No cervical lymphadenopathy.  No pharyngeal erythema. ?Cardiac: RRR, no murmurs. ?Respiratory: CTAB, normal effort, No wheezes, rales or rhonchi ?Psych: Normal affect and mood ? ?ASSESSMENT/PLAN:  ? ?Seasonal allergies: ?Previously using Flonase and Claritin.  He did stop using the Flonase after 2 weeks due to a misunderstanding.  He is not experiencing any fevers and physical exam is consistent for boggy nasal turbinates suggestive of seasonal allergies similar to previous exam.  I recommended using the Claritin going forward as well as the Flonase 2 sprays in each nare daily.  I also recommended getting a Nettie pot or nasal lavage to flush his sinuses once to twice per day.  Provided extensive discussion over seasonal allergies and how to use the nasal lavage versus Nettie pot to rinse out the sinuses.  I recommended continuing to change his air filters regularly, trying to avoid cigarette smoke from his neighbors when possible, and recommended following up in a couple of months to see how he is doing.  I discussed returning sooner should he develop any trouble breathing, shortness of breath, fevers, or any other concerns that  they may have. ? ?Lurline Del, DO ?[place]  ? ? ? ?

## 2021-10-16 NOTE — Patient Instructions (Signed)
For your seasonal allergies I want you to take the Flonase 2 sprays in each nose each day going forward.  I would use this for at least a couple of months as we get through the allergy season.  You can also continue taking the loratadine daily.  I do want you to get a nasal lavage or Nettie pot and flush out your sinuses at least once to twice per day.  If you develop any worsening symptoms, if you develop any fevers, trouble breathing, or other concerns please let us know. ? ? ?

## 2021-12-01 DIAGNOSIS — N2581 Secondary hyperparathyroidism of renal origin: Secondary | ICD-10-CM | POA: Diagnosis not present

## 2021-12-01 DIAGNOSIS — D631 Anemia in chronic kidney disease: Secondary | ICD-10-CM | POA: Diagnosis not present

## 2021-12-01 DIAGNOSIS — N1832 Chronic kidney disease, stage 3b: Secondary | ICD-10-CM | POA: Diagnosis not present

## 2021-12-01 DIAGNOSIS — I129 Hypertensive chronic kidney disease with stage 1 through stage 4 chronic kidney disease, or unspecified chronic kidney disease: Secondary | ICD-10-CM | POA: Diagnosis not present

## 2022-04-05 ENCOUNTER — Other Ambulatory Visit: Payer: Self-pay

## 2022-04-05 DIAGNOSIS — E785 Hyperlipidemia, unspecified: Secondary | ICD-10-CM

## 2022-04-05 MED ORDER — ATORVASTATIN CALCIUM 40 MG PO TABS: 40.0000 mg | ORAL_TABLET | Freq: Every day | ORAL | 3 refills | Status: DC

## 2022-04-25 ENCOUNTER — Ambulatory Visit: Payer: Medicare Other | Admitting: Family Medicine

## 2022-04-26 ENCOUNTER — Ambulatory Visit: Payer: Medicare Other

## 2022-05-07 ENCOUNTER — Telehealth: Payer: Self-pay

## 2022-05-07 ENCOUNTER — Encounter: Payer: Self-pay | Admitting: Family Medicine

## 2022-05-07 ENCOUNTER — Ambulatory Visit (INDEPENDENT_AMBULATORY_CARE_PROVIDER_SITE_OTHER): Payer: Medicare Other | Admitting: Family Medicine

## 2022-05-07 ENCOUNTER — Other Ambulatory Visit: Payer: Self-pay | Admitting: Family Medicine

## 2022-05-07 VITALS — BP 128/86 | HR 70 | Wt 177.2 lb

## 2022-05-07 DIAGNOSIS — Z23 Encounter for immunization: Secondary | ICD-10-CM | POA: Diagnosis not present

## 2022-05-07 DIAGNOSIS — E1159 Type 2 diabetes mellitus with other circulatory complications: Secondary | ICD-10-CM | POA: Diagnosis not present

## 2022-05-07 DIAGNOSIS — E1121 Type 2 diabetes mellitus with diabetic nephropathy: Secondary | ICD-10-CM | POA: Diagnosis not present

## 2022-05-07 DIAGNOSIS — I152 Hypertension secondary to endocrine disorders: Secondary | ICD-10-CM

## 2022-05-07 DIAGNOSIS — N1832 Chronic kidney disease, stage 3b: Secondary | ICD-10-CM | POA: Diagnosis not present

## 2022-05-07 LAB — POCT GLYCOSYLATED HEMOGLOBIN (HGB A1C): HbA1c, POC (controlled diabetic range): 5.6 % (ref 0.0–7.0)

## 2022-05-07 NOTE — Progress Notes (Signed)
    SUBJECTIVE:   CHIEF COMPLAINT / HPI: check up/vaccines  Patient is here today to meet new PCP.  No acute health issues.  Hypertension - Taking amlodipine 10 mg daily and Lisinopril 5 mg daily.  No chest pain.  Type 2 diabetes - Not currently on diabetic medications.  Last A1c 5.5.  Due for repeat A1c.  Chronic kidney disease stage III - Sees [facility].  Per scanned media note from December 01, 2021 they are considering SGLT2 inhibitor.  Urine microalbumin to creatinine ratio was 67.  States they have appointment with [facility] next week.  Healthcare maintenance -patient is agreeable to getting COVID and flu vaccines today.  PERTINENT  PMH / PSH: Hypertension, type 2 diabetes, seasonal allergies  OBJECTIVE:   BP 128/86   Pulse 70   Wt 177 lb 4 oz (80.4 kg)   SpO2 100%   BMI 26.95 kg/m    General: NAD Cardiovascular: RRR, no murmurs, no peripheral edema Respiratory: normal WOB on RA, CTAB, no wheezes, ronchi or rales Extremities: Moving all 4 extremities equally    ASSESSMENT/PLAN:   Hypertension associated with diabetes (H[facility]) Continues to be well-controlled on amlodipine and lisinopril.  Continue current regimen. 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  Continue current management with lifestyle modifications with improved diet.  Patient is not due for urine microalbumin today as it was done by his nephrologist June 2023 with a result of 67.  He is next due June 2024. -Repeat A1c 6 months -Discuss ophthalmology exam at next visit  Stage 3b chronic kidney disease (CKD) ([place]) Has appointment with [facility] next week.  Is going for renal function panel this afternoon per patient and his wife. [facility] sending regular records. -Consider SGLT2 inhibitor at next visit - Follow-up early January  Encounter for  immunization Patient received COVID booster, and flu vaccine today.     Salvadore Oxford, MD [place]

## 2022-05-07 NOTE — Assessment & Plan Note (Signed)
Has appointment with [facility] next week.  Is going for renal function panel this afternoon per patient and his wife. [facility] sending regular records. -Consider SGLT2 inhibitor at next visit - Follow-up early January

## 2022-05-07 NOTE — Assessment & Plan Note (Addendum)
Repeat A1c today was 5.6 (5.5 previously).  Continue current management with lifestyle modifications with improved diet.  Patient is not due for urine microalbumin today as it was done by his nephrologist June 2023 with a result of 67.  He is next due June 2024. -Repeat A1c 6 months -Discuss ophthalmology exam at next visit

## 2022-05-07 NOTE — Assessment & Plan Note (Signed)
Continues to be well-controlled on amlodipine and lisinopril.  Continue current regimen. -Lisinopril 5 mg daily -Amlodipine 10 mg daily

## 2022-05-07 NOTE — Telephone Encounter (Signed)
Patient's wife returns call to nurse line. She reports missing a phone call from our office. She did not receive a VM.   Per note, it appears that provider was reaching out with A1c results. Wife reports that they waited at office and they were provided with results prior to leaving.   Wife has no further questions at this time. Advised wife that I would send message to provider to see if there was any other information that needed to be discussed.   Talbot Grumbling, RN

## 2022-05-07 NOTE — Assessment & Plan Note (Signed)
Patient received COVID booster, and flu vaccine today.

## 2022-05-07 NOTE — Patient Instructions (Addendum)
It was great to see you! Thank you for allowing me to participate in your care!  I recommend that you always bring your medications to each appointment as this makes it easy to ensure we are on the correct medications and helps [place] not miss when refills are needed.  Our plans for today:  -Please have [facility] fax over your next notes. -I will let you know about your A1c result checked today. -Please continue taking your medications, and bring in at next visit   Take care and seek immediate care sooner if you develop any concerns.   Dr. Salvadore Oxford, MD [place]

## 2022-05-28 DIAGNOSIS — I129 Hypertensive chronic kidney disease with stage 1 through stage 4 chronic kidney disease, or unspecified chronic kidney disease: Secondary | ICD-10-CM | POA: Diagnosis not present

## 2022-05-28 DIAGNOSIS — D631 Anemia in chronic kidney disease: Secondary | ICD-10-CM | POA: Diagnosis not present

## 2022-05-28 DIAGNOSIS — N1832 Chronic kidney disease, stage 3b: Secondary | ICD-10-CM | POA: Diagnosis not present

## 2022-05-28 DIAGNOSIS — N2581 Secondary hyperparathyroidism of renal origin: Secondary | ICD-10-CM | POA: Diagnosis not present

## 2022-06-01 DIAGNOSIS — R972 Elevated prostate specific antigen [PSA]: Secondary | ICD-10-CM | POA: Diagnosis not present

## 2022-06-06 DIAGNOSIS — R351 Nocturia: Secondary | ICD-10-CM | POA: Diagnosis not present

## 2022-06-06 DIAGNOSIS — N401 Enlarged prostate with lower urinary tract symptoms: Secondary | ICD-10-CM | POA: Diagnosis not present

## 2022-07-19 ENCOUNTER — Telehealth: Payer: Self-pay | Admitting: Family Medicine

## 2022-07-19 NOTE — Telephone Encounter (Signed)
Left message for patient to call back and schedule Medicare Annual Wellness Visit (AWV).   Please offer to do virtually or by telephone.  Left office number and my jabber [phone].  Last AWV:11/24/2020   Please schedule at anytime with Nurse Health Advisor.

## 2022-08-02 NOTE — Progress Notes (Signed)
I connected with  Theadore Nan on 08/03/2022 by a audio enabled telemedicine application and verified that I am speaking with the correct person using two identifiers.  Patient Location: Home  Provider Location: Home Office  I discussed the limitations of evaluation and management by telemedicine. The patient expressed understanding and agreed to proceed.   Subjective:   Anthony Moran is a 72 y.o. male who presents for Medicare Annual/Subsequent preventive examination.  Review of Systems    Per HPI unless specifically indicated below.  Cardiac Risk Factors include: advanced age (>40mn, >>14women);male gender, Hypertension, and Hyperlipidemia.          Objective:       05/07/2022    2:18 PM 10/16/2021    1:45 PM 09/19/2021    9:06 AM  Vitals with BMI  Height  '5\' 8"'$    Weight 177 lbs 4 oz 182 lbs 6 oz 181 lbs 10 oz  BMI  [id number]  Systolic [id number] Diastolic 86 83 70  Pulse 70 74 69    Today's Vitals   08/03/22 1000  PainSc: 0-No pain   There is no height or weight on file to calculate BMI.     08/03/2022   10:05 AM 05/07/2022    2:17 PM 10/16/2021    1:44 PM 09/19/2021    9:23 AM 11/24/2020    2:04 PM 09/30/2019    8:55 AM 10/25/2017    9:22 AM  Advanced Directives  Does Patient Have a Medical Advance Directive? No No No No No No No  Would patient like information on creating a medical advance directive?  No - Patient declined No - Patient declined No - Patient declined Yes (MAU/Ambulatory/Procedural Areas - Information given) No - Patient declined No - Patient declined    Current Medications (verified) Outpatient Encounter Medications as of 08/03/2022  Medication Sig   amLODipine (NORVASC) 10 MG tablet Take 0.5 tablets (5 mg total) by mouth daily. (Patient taking differently: Take 10 mg by mouth daily.)   aspirin EC 81 MG tablet Take 1 tablet (81 mg total) by mouth daily.   atorvastatin (LIPITOR) 40 MG tablet Take 1 tablet (40 mg total) by mouth daily. PLEASE  SCHEDULE APPT WITH PROVIDER   calcitRIOL (ROCALTROL) 0.25 MCG capsule Take 0.25 mcg by mouth daily.   clobetasol cream (TEMOVATE) 0AB-123456789% Apply 1 application topically 2 (two) times daily as needed (rash).   fluticasone (FLONASE) 50 MCG/ACT nasal spray Place 2 sprays into both nostrils daily.   lisinopril (PRINIVIL,ZESTRIL) 5 MG tablet Take 1 tablet (5 mg total) by mouth daily.   loratadine (CLARITIN) 10 MG tablet TAKE 1 TABLET BY MOUTH EVERY DAY   tamsulosin (FLOMAX) 0.4 MG CAPS capsule TAKE 1 CAPSULE BY MOUTH EVERY DAY   [DISCONTINUED] montelukast (SINGULAIR) 10 MG tablet Take 10 mg by mouth daily.   No facility-administered encounter medications on file as of 08/03/2022.    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   Pneumonia    Spinal stenosis in cervical region    Past Surgical History:  Procedure Laterality Date   LUMBAR FUSION     Lumbar three-four laminectomy with posterior lumbar interbody fusion, Dr Arnoldo Morale   Family History  Problem Relation Age of Onset   Hypertension Mother    Prostate cancer Father    Hypertension Sister    Breast cancer Maternal Aunt    Colon cancer Neg Hx    Colon polyps Neg Hx    Esophageal cancer Neg Hx    Rectal cancer Neg Hx    Stomach cancer Neg Hx    Social History   Socioeconomic History   Marital status: Married    Spouse name: Legrand Rams   Number of children: 3   Years of education: 16   Highest education level: Bachelor's degree (e.g., BA, AB, BS)   Occupational History   Occupation: Retired  Tobacco Use   Smoking status: Never   Smokeless tobacco: Never  Vaping Use   Vaping Use: Never used  Substance and Sexual Activity   Alcohol use: Never   Drug use: No   Sexual activity: Yes  Other Topics Concern   Not on file  Social History Narrative   Patient lives with his wife in [place].    Patient enjoys spending time at home with his wife during "covid times."   Patient does yoga for his back pain weekly.    Patient likes to read.    Social Determinants of Health   Financial Resource Strain: Low Risk  (08/03/2022)   Overall Financial Resource Strain (CARDIA)    Difficulty of Paying Living Expenses: Not hard at all  Food Insecurity: No Food Insecurity (08/03/2022)   Hunger Vital Sign    Worried About Running Out of Food in the Last Year: Never true    Ran Out of Food in the Last Year: Never true  Transportation Needs: Unknown (08/03/2022)   PRAPARE - Hydrologist (Medical): No    Lack of Transportation (Non-Medical): Not on file  Physical Activity: Insufficiently Active (08/03/2022)   Exercise Vital Sign    Days of Exercise per Week: 3 days    Minutes of Exercise per Session: 30 min  Stress: No Stress Concern Present (08/03/2022)   [place]    Feeling of Stress : Not at all  Social Connections: Moderately Isolated (08/03/2022)   Social Connection and Isolation Panel [NHANES]    Frequency of Communication with Friends and Family: More than three times a week    Frequency of Social Gatherings with Friends and Family: Once a week    Attends Religious Services: Never    Marine scientist or Organizations: No    Attends Music therapist: Never    Marital Status: Married    Tobacco Counseling Counseling given: No   Clinical Intake:  Pre-visit preparation completed: No  Pain : No/denies pain Pain Score:  0-No pain     Nutritional Status: BMI of 19-24  Normal Nutritional Risks: None Diabetes: Yes CBG done?: No Did pt. bring in CBG monitor from home?: No  How often do you need to have someone help you when you read instructions, pamphlets, or other written materials from your doctor or pharmacy?: 1 - Never  Diabetic?Nutrition Risk Assessment:  Has the patient had any N/V/D within the last 2 months?  No  Does the  patient have any non-healing wounds?  No  Has the patient had any unintentional weight loss or weight gain?  No   Diabetes:  Is the patient diabetic?  Yes If diabetic, was a CBG obtained today?  No  Did the patient bring in their glucometer from home?   none How often do you monitor your CBG's? never.   Financial Strains and Diabetes Management:  Are you having any financial strains with the device, your supplies or your medication? No .  Does the patient want to be seen by Chronic Care Management for management of their diabetes?  No  Would the patient like to be referred to a Nutritionist or for Diabetic Management?  No   Diabetic Exams:  Diabetic Eye Exam: Overdue for diabetic eye exam. Pt has been advised about the importance in completing this exam. Patient advised to call and schedule an eye exam. Diabetic Foot Exam: Overdue, Pt has been advised about the importance in completing this exam. Pt is scheduled for diabetic foot exam on next diabetic visit .   Interpreter Needed?: No  Information entered by :: Donnie Mesa, CMA   Activities of Daily Living    08/03/2022    9:55 AM  In your present state of health, do you have any difficulty performing the following activities:  Hearing? 0  Vision? 1  Difficulty concentrating or making decisions? 0  Walking or climbing stairs? 0  Dressing or bathing? 0  Doing errands, shopping? 0    Patient Care Team: Salvadore Oxford, MD as PCP - General (Family Medicine) Remi Haggard, MD as Consulting Physician  (Urology)  Indicate any recent Medical Services you may have received from other than [facility] providers in the past year (date may be approximate).     Assessment:   This is a routine wellness examination for Anthony Moran.  Hearing/Vision screen Denies any hearing issues. Denies any vision changes. Annual Eye Exam  Dietary issues and exercise activities discussed: Current Exercise Habits: Structured exercise class, Type of exercise: stretching;yoga, Time (Minutes): 40, Frequency (Times/Week): 3, Weekly Exercise (Minutes/Week): 120, Intensity: Moderate, Exercise limited by: orthopedic condition(s)   Goals Addressed   None    Depression Screen    08/03/2022    9:54 AM 05/07/2022    2:17 PM 10/16/2021    1:45 PM 09/19/2021    9:08 AM 12/07/2020    8:38 AM 11/24/2020    1:59 PM 09/30/2019    8:55 AM  PHQ 2/9 Scores  PHQ - 2 Score 0 0 0 0 0 0 0  PHQ- 9 Score  0 0 0 0      Fall Risk    08/03/2022    9:55 AM 05/07/2022    2:17 PM 10/16/2021    1:44 PM 12/07/2020    8:38 AM 11/24/2020    2:05 PM  Fall Risk   Falls in the past year? 0 0 0 0 0  Number falls in past yr: 0 0 0 0   Injury with Fall? 0 0 0 0   Risk for fall due to : No Fall Risks    No Fall Risks  Follow up Falls evaluation completed    Falls prevention discussed    FALL RISK PREVENTION PERTAINING TO THE HOME:  Any stairs in or around the home? No  If so, are there any without handrails? No  Home free of loose throw rugs in walkways, pet beds, electrical cords, etc? No Adequate lighting in your home to reduce risk  of falls? Yes   ASSISTIVE DEVICES UTILIZED TO PREVENT FALLS:  Life alert? No  Use of a cane, walker or w/c? No  Grab bars in the bathroom? No  Shower chair or bench in shower? No  Elevated toilet seat or a handicapped toilet? Yes   TIMED UP AND GO:  Was the test performed?Unable to perform, virtual appointment   Cognitive Function:        08/03/2022    9:58 AM 11/24/2020    2:08 PM  6CIT Screen  What  Year? 0 points 0 points  What month? 0 points 0 points  What time? 0 points 0 points  Count back from 20 0 points 0 points  Months in reverse 0 points 0 points  Repeat phrase 10 points 0 points  Total Score 10 points 0 points    Immunizations Immunization History  Administered Date(s) Administered   COVID-19, mRNA, vaccine(Comirnaty)12 years and older 05/07/2022   Fluad Quad(high Dose 65+) 03/30/2021, 05/07/2022   Influenza,inj,Quad PF,6+ Mos 07/25/2017   PFIZER(Purple Top)SARS-COV-2 Vaccination 08/13/2019, 09/09/2019, 04/09/2020   Pfizer Covid-19 Vaccine Bivalent Booster 8yr & up 03/30/2021   Pneumococcal Conjugate-13 07/25/2017   Pneumococcal Polysaccharide-23 09/30/2019   Tdap 04/13/2016    TDAP status: Up to date  Flu Vaccine status: Up to date  Pneumococcal vaccine status: Up to date  Covid-19 vaccine status: Information provided on how to obtain vaccines.   Qualifies for Shingles Vaccine? Yes   Zostavax completed No   Shingrix Completed?: No.    Education has been provided regarding the importance of this vaccine. Patient has been advised to call insurance company to determine out of pocket expense if they have not yet received this vaccine. Advised may also receive vaccine at local pharmacy or Health Dept. Verbalized acceptance and understanding.  Screening Tests Health Maintenance  Topic Date Due   Zoster Vaccines- Shingrix (1 of 2) Never done   Diabetic kidney evaluation - Urine ACR  07/25/2018   OPHTHALMOLOGY EXAM  01/31/2019   Diabetic kidney evaluation - eGFR measurement  12/07/2021   FOOT EXAM  12/07/2021   HEMOGLOBIN A1C  11/05/2022   Medicare Annual Wellness (AWV)  08/04/2023   DTaP/Tdap/Td (2 - Td or Tdap) 04/13/2026   COLONOSCOPY (Pts 45-477yrInsurance coverage will need to be confirmed)  11/23/2029   Pneumonia Vaccine 6548Years old  Completed   INFLUENZA VACCINE  Completed   COVID-19 Vaccine  Completed   Hepatitis C Screening  Completed   HPV  VACCINES  Aged Out    Health Maintenance  Health Maintenance Due  Topic Date Due   Zoster Vaccines- Shingrix (1 of 2) Never done   Diabetic kidney evaluation - Urine ACR  07/25/2018   OPHTHALMOLOGY EXAM  01/31/2019   Diabetic kidney evaluation - eGFR measurement  12/07/2021   FOOT EXAM  12/07/2021    Colorectal cancer screening: Type of screening: Colonoscopy. Completed 11/24/2019. Repeat every 10 years  Lung Cancer Screening: (Low Dose CT Chest recommended if Age 71-80ears, 30 pack-year currently smoking OR have quit w/in 15years.) does not qualify.   Lung Cancer Screening Referral: NOT APPLICABLE   Additional Screening:  Hepatitis C Screening: does qualify; Completed 06/01/2016  Vision Screening: Recommended annual ophthalmology exams for early detection of glaucoma and other disorders of the eye. Is the patient up to date with their annual eye exam?  No  Who is the provider or what is the name of the office in which the patient attends annual eye exams?  [facility] If pt is not established with a provider, would they like to be referred to a provider to establish care? No .   Dental Screening: Recommended annual dental exams for proper oral hygiene  Community Resource Referral / Chronic Care Management: CRR required this visit?  No   CCM required this visit?  No      Plan:     I have personally reviewed and noted the following in the patient's chart:   Medical and social history Use of alcohol, tobacco or illicit drugs  Current medications and supplements including opioid prescriptions. Patient is not currently taking opioid prescriptions. Functional ability and status Nutritional status Physical activity Advanced directives List of other physicians Hospitalizations, surgeries, and ER visits in previous 12 months Vitals Screenings to include cognitive, depression, and falls Referrals and appointments  In addition, I have reviewed and discussed with  patient certain preventive protocols, quality metrics, and best practice recommendations. A written personalized care plan for preventive services as well as general preventive health recommendations were provided to patient.    Anthony Moran , Thank you for taking time to come for your Medicare Wellness Visit. I appreciate your ongoing commitment to your health goals. Please review the following plan we discussed and let me know if I can assist you in the future.   These are the goals we discussed:  Goals      Exercise 4x per week (30 min per time)     Patient already walks 3x per week. Patient would like to add one more day.         This is a list of the screening recommended for you and due dates:  Health Maintenance  Topic Date Due   Zoster (Shingles) Vaccine (1 of 2) Never done   Yearly kidney health urinalysis for diabetes  07/25/2018   Eye exam for diabetics  01/31/2019   Yearly kidney function blood test for diabetes  12/07/2021   Complete foot exam   12/07/2021   Hemoglobin A1C  11/05/2022   Medicare Annual Wellness Visit  08/04/2023   DTaP/Tdap/Td vaccine (2 - Td or Tdap) 04/13/2026   Colon Cancer Screening  11/23/2029   Pneumonia Vaccine  Completed   Flu Shot  Completed   COVID-19 Vaccine  Completed   Hepatitis C Screening: USPSTF Recommendation to screen - Ages 60-79 yo.  Completed   HPV Vaccine  Aged [address], [place]   08/03/2022  Nurse Notes: Approximately 30 minute Non-Face -To-Face Medicare Wellness Visit

## 2022-08-02 NOTE — Patient Instructions (Signed)
Health Maintenance, Male Adopting a healthy lifestyle and getting preventive care are important in promoting health and wellness. Ask your health care provider about: The right schedule for you to have regular tests and exams. Things you can do on your own to prevent diseases and keep yourself healthy. What should I know about diet, weight, and exercise? Eat a healthy diet  Eat a diet that includes plenty of vegetables, fruits, low-fat dairy products, and lean protein. Do not eat a lot of foods that are high in solid fats, added sugars, or sodium. Maintain a healthy weight Body mass index (BMI) is a measurement that can be used to identify possible weight problems. It estimates body fat based on height and weight. Your health care provider can help determine your BMI and help you achieve or maintain a healthy weight. Get regular exercise Get regular exercise. This is one of the most important things you can do for your health. Most adults should: Exercise for at least 150 minutes each week. The exercise should increase your heart rate and make you sweat (moderate-intensity exercise). Do strengthening exercises at least twice a week. This is in addition to the moderate-intensity exercise. Spend less time sitting. Even light physical activity can be beneficial. Watch cholesterol and blood lipids Have your blood tested for lipids and cholesterol at 71 years of age, then have this test every 5 years. You may need to have your cholesterol levels checked more often if: Your lipid or cholesterol levels are high. You are older than 71 years of age. You are at high risk for heart disease. What should I know about cancer screening? Many types of cancers can be detected early and may often be prevented. Depending on your health history and family history, you may need to have cancer screening at various ages. This may include screening for: Colorectal cancer. Prostate cancer. Skin cancer. Lung  cancer. What should I know about heart disease, diabetes, and high blood pressure? Blood pressure and heart disease High blood pressure causes heart disease and increases the risk of stroke. This is more likely to develop in people who have high blood pressure readings or are overweight. Talk with your health care provider about your target blood pressure readings. Have your blood pressure checked: Every 3-5 years if you are 18-39 years of age. Every year if you are 40 years old or older. If you are between the ages of 65 and 75 and are a current or former smoker, ask your health care provider if you should have a one-time screening for abdominal aortic aneurysm (AAA). Diabetes Have regular diabetes screenings. This checks your fasting blood sugar level. Have the screening done: Once every three years after age 45 if you are at a normal weight and have a low risk for diabetes. More often and at a younger age if you are overweight or have a high risk for diabetes. What should I know about preventing infection? Hepatitis B If you have a higher risk for hepatitis B, you should be screened for this virus. Talk with your health care provider to find out if you are at risk for hepatitis B infection. Hepatitis C Blood testing is recommended for: Everyone born from 1945 through 1965. Anyone with known risk factors for hepatitis C. Sexually transmitted infections (STIs) You should be screened each year for STIs, including gonorrhea and chlamydia, if: You are sexually active and are younger than 71 years of age. You are older than 71 years of age and your   health care provider tells you that you are at risk for this type of infection. Your sexual activity has changed since you were last screened, and you are at increased risk for chlamydia or gonorrhea. Ask your health care provider if you are at risk. Ask your health care provider about whether you are at high risk for HIV. Your health care provider  may recommend a prescription medicine to help prevent HIV infection. If you choose to take medicine to prevent HIV, you should first get tested for HIV. You should then be tested every 3 months for as long as you are taking the medicine. Follow these instructions at home: Alcohol use Do not drink alcohol if your health care provider tells you not to drink. If you drink alcohol: Limit how much you have to 0-2 drinks a day. Know how much alcohol is in your drink. In the [place] one drink equals one 12 oz bottle of beer (355 mL), one 5 oz glass of wine (148 mL), or one 1 oz glass of hard liquor (44 mL). Lifestyle Do not use any products that contain nicotine or tobacco. These products include cigarettes, chewing tobacco, and vaping devices, such as e-cigarettes. If you need help quitting, ask your health care provider. Do not use street drugs. Do not share needles. Ask your health care provider for help if you need support or information about quitting drugs. General instructions Schedule regular health, dental, and eye exams. Stay current with your vaccines. Tell your health care provider if: You often feel depressed. You have ever been abused or do not feel safe at home. Summary Adopting a healthy lifestyle and getting preventive care are important in promoting health and wellness. Follow your health care provider's instructions about healthy diet, exercising, and getting tested or screened for diseases. Follow your health care provider's instructions on monitoring your cholesterol and blood pressure. This information is not intended to replace advice given to you by your health care provider. Make sure you discuss any questions you have with your health care provider. Document Revised: 10/17/2020 Document Reviewed: 10/17/2020 Elsevier Patient Education  2023 Elsevier Inc.  

## 2022-08-03 ENCOUNTER — Ambulatory Visit (INDEPENDENT_AMBULATORY_CARE_PROVIDER_SITE_OTHER): Payer: Medicare Other

## 2022-08-03 DIAGNOSIS — Z0000 Encounter for general adult medical examination without abnormal findings: Secondary | ICD-10-CM | POA: Diagnosis not present

## 2022-08-06 ENCOUNTER — Encounter: Payer: Self-pay | Admitting: Family Medicine

## 2022-08-06 ENCOUNTER — Ambulatory Visit (INDEPENDENT_AMBULATORY_CARE_PROVIDER_SITE_OTHER): Payer: Medicare Other | Admitting: Family Medicine

## 2022-08-06 VITALS — BP 124/70 | HR 58 | Ht 70.5 in | Wt 177.0 lb

## 2022-08-06 DIAGNOSIS — N1832 Chronic kidney disease, stage 3b: Secondary | ICD-10-CM | POA: Diagnosis not present

## 2022-08-06 DIAGNOSIS — E1121 Type 2 diabetes mellitus with diabetic nephropathy: Secondary | ICD-10-CM

## 2022-08-06 DIAGNOSIS — L309 Dermatitis, unspecified: Secondary | ICD-10-CM

## 2022-08-06 DIAGNOSIS — Z23 Encounter for immunization: Secondary | ICD-10-CM | POA: Diagnosis not present

## 2022-08-06 MED ORDER — CLOBETASOL PROPIONATE 0.05 % EX CREA: 1.0000 | TOPICAL_CREAM | Freq: Two times a day (BID) | CUTANEOUS | 1 refills | Status: AC | PRN

## 2022-08-06 NOTE — Assessment & Plan Note (Signed)
Per media tab, patient records from last [facility] visit. Patient chronic kidney disease is stable, and will likely introduce SGLT2 inhibitor for patient's renal protection with in next 6 months. Discussed with patient what this medication is and how it works. Patient does not wish start at this time. Explained that it would help to prevent further kidney damage. Patient amenable to considering at later date.

## 2022-08-06 NOTE — Patient Instructions (Addendum)
It was great to see you! Thank you for allowing me to participate in your care!  I recommend that you always bring your medications to each appointment as this makes it easy to ensure we are on the correct medications and helps [place] not miss when refills are needed.  Our plans for today:  -I have refilled your clobetasol cream. -I have sent a prescription to your pharmacy for the shingles vaccine, you will get this twice, once the the first time you go, then again 2 months later. -Please call your eye doctor to make an appointment -Jardiance is the name of medication that you are kidney doctor will likely want to start you on.   Please arrive 15 minutes PRIOR to your next scheduled appointment time! If you do not, this affects OTHER patients' care.  Take care and seek immediate care sooner if you develop any concerns.   Dr. Salvadore Oxford, MD [place]

## 2022-08-06 NOTE — Assessment & Plan Note (Signed)
Patient states will make appointment with [facility]. Repeat A1c in 3 months.

## 2022-08-06 NOTE — Assessment & Plan Note (Signed)
Sent Shingrix vaccine to patient's pharmacy. Patient stated he would go to get.

## 2022-08-06 NOTE — Assessment & Plan Note (Signed)
Refilled Clobetasol cream. Discussed risk of chronic steroid cream use. Explain using only intermittently with flares. Discussed trying Euscerin at least 2 times daily after bathing for itchiness before try steroid cream. Patient verbalized understanding.

## 2022-08-06 NOTE — Progress Notes (Signed)
    SUBJECTIVE:   CHIEF COMPLAINT / HPI: follow-up  Eczema - Intermittent itching. Has to use clobetosol cream. Would like a refill. Also uses Euscerin cream intermittently for dry skin. Denies having current rash which normally present on bilateral calves.  Eye doctor - Patient states he has seen Dr. Katy Fitch, states will call for diabetic eye exam.  CKD - Patient reports he last saw his kidney doctor in November who said his kidney disease was stable.  Discussed vaccination for herpes zoster. Patient denies having had chicken pox in past.   PERTINENT  PMH / PSH: T2DM, CKD3b, HTN  OBJECTIVE:   BP 124/70   Pulse (!) 58   Ht 5' 10.5" (1.791 m)   Wt 177 lb (80.3 kg)   SpO2 100%   BMI 25.04 kg/m   General: NAD  Neuro: A&O Cardiovascular: RRR, no murmurs, no peripheral edema Respiratory: normal WOB on RA, CTAB, no wheezes, ronchi or rales Abdomen: soft, NTTP, no rebound or guarding Extremities: Moving all 4 extremities equally   ASSESSMENT/PLAN:   Eczema, unspecified type Assessment & Plan: Refilled Clobetasol cream. Discussed risk of chronic steroid cream use. Explain using only intermittently with flares. Discussed trying Euscerin at least 2 times daily after bathing for itchiness before try steroid cream. Patient verbalized understanding.  Orders: -     Clobetasol Propionate; Apply 1 Application topically 2 (two) times daily as needed (rash).  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 Repeat A1c in 3 months.   Stage 3b chronic kidney disease (CKD) ([place]) Assessment & Plan: Per media tab, patient records from last [facility] visit. Patient chronic kidney disease is stable, and will likely introduce SGLT2 inhibitor for patient's renal protection with in next 6 months. Discussed with patient what this medication is and how it works. Patient does  not wish start at this time. Explained that it would help to prevent further kidney damage. Patient amenable to considering at later date.   Encounter for immunization Assessment & Plan: Sent Shingrix vaccine to patient's pharmacy. Patient stated he would go to get.     Return in about 3 months (around 11/04/2022) for A1c check.  Salvadore Oxford, MD [place]

## 2022-11-13 ENCOUNTER — Other Ambulatory Visit: Payer: Self-pay | Admitting: Family Medicine

## 2022-11-13 DIAGNOSIS — J309 Allergic rhinitis, unspecified: Secondary | ICD-10-CM

## 2022-11-21 DIAGNOSIS — N1832 Chronic kidney disease, stage 3b: Secondary | ICD-10-CM | POA: Diagnosis not present

## 2022-11-28 DIAGNOSIS — I129 Hypertensive chronic kidney disease with stage 1 through stage 4 chronic kidney disease, or unspecified chronic kidney disease: Secondary | ICD-10-CM | POA: Diagnosis not present

## 2022-11-28 DIAGNOSIS — E1122 Type 2 diabetes mellitus with diabetic chronic kidney disease: Secondary | ICD-10-CM | POA: Diagnosis not present

## 2022-11-28 DIAGNOSIS — D631 Anemia in chronic kidney disease: Secondary | ICD-10-CM | POA: Diagnosis not present

## 2022-11-28 DIAGNOSIS — N1832 Chronic kidney disease, stage 3b: Secondary | ICD-10-CM | POA: Diagnosis not present

## 2022-11-28 DIAGNOSIS — N2581 Secondary hyperparathyroidism of renal origin: Secondary | ICD-10-CM | POA: Diagnosis not present

## 2022-12-21 DIAGNOSIS — N1832 Chronic kidney disease, stage 3b: Secondary | ICD-10-CM | POA: Diagnosis not present

## 2022-12-21 DIAGNOSIS — I129 Hypertensive chronic kidney disease with stage 1 through stage 4 chronic kidney disease, or unspecified chronic kidney disease: Secondary | ICD-10-CM | POA: Diagnosis not present

## 2023-02-23 ENCOUNTER — Other Ambulatory Visit: Payer: Self-pay | Admitting: Family Medicine

## 2023-02-23 DIAGNOSIS — E785 Hyperlipidemia, unspecified: Secondary | ICD-10-CM

## 2023-04-10 DIAGNOSIS — N2581 Secondary hyperparathyroidism of renal origin: Secondary | ICD-10-CM | POA: Diagnosis not present

## 2023-04-10 DIAGNOSIS — N1832 Chronic kidney disease, stage 3b: Secondary | ICD-10-CM | POA: Diagnosis not present

## 2023-04-10 DIAGNOSIS — E1122 Type 2 diabetes mellitus with diabetic chronic kidney disease: Secondary | ICD-10-CM | POA: Diagnosis not present

## 2023-04-10 DIAGNOSIS — I129 Hypertensive chronic kidney disease with stage 1 through stage 4 chronic kidney disease, or unspecified chronic kidney disease: Secondary | ICD-10-CM | POA: Diagnosis not present

## 2023-04-10 DIAGNOSIS — D631 Anemia in chronic kidney disease: Secondary | ICD-10-CM | POA: Diagnosis not present

## 2023-04-10 DIAGNOSIS — N189 Chronic kidney disease, unspecified: Secondary | ICD-10-CM | POA: Diagnosis not present

## 2023-06-03 DIAGNOSIS — N401 Enlarged prostate with lower urinary tract symptoms: Secondary | ICD-10-CM | POA: Diagnosis not present

## 2023-06-10 DIAGNOSIS — N401 Enlarged prostate with lower urinary tract symptoms: Secondary | ICD-10-CM | POA: Diagnosis not present

## 2023-06-10 DIAGNOSIS — R972 Elevated prostate specific antigen [PSA]: Secondary | ICD-10-CM | POA: Diagnosis not present

## 2023-06-10 DIAGNOSIS — N5201 Erectile dysfunction due to arterial insufficiency: Secondary | ICD-10-CM | POA: Diagnosis not present

## 2023-06-10 DIAGNOSIS — R351 Nocturia: Secondary | ICD-10-CM | POA: Diagnosis not present

## 2023-06-11 DIAGNOSIS — E119 Type 2 diabetes mellitus without complications: Secondary | ICD-10-CM | POA: Diagnosis not present

## 2023-06-11 DIAGNOSIS — H40013 Open angle with borderline findings, low risk, bilateral: Secondary | ICD-10-CM | POA: Diagnosis not present

## 2023-06-11 DIAGNOSIS — H2513 Age-related nuclear cataract, bilateral: Secondary | ICD-10-CM | POA: Diagnosis not present

## 2023-07-23 ENCOUNTER — Ambulatory Visit: Payer: Medicare Other

## 2023-08-27 DIAGNOSIS — I129 Hypertensive chronic kidney disease with stage 1 through stage 4 chronic kidney disease, or unspecified chronic kidney disease: Secondary | ICD-10-CM | POA: Diagnosis not present

## 2023-08-27 DIAGNOSIS — N1832 Chronic kidney disease, stage 3b: Secondary | ICD-10-CM | POA: Diagnosis not present

## 2023-08-27 DIAGNOSIS — E1122 Type 2 diabetes mellitus with diabetic chronic kidney disease: Secondary | ICD-10-CM | POA: Diagnosis not present

## 2023-08-27 DIAGNOSIS — D631 Anemia in chronic kidney disease: Secondary | ICD-10-CM | POA: Diagnosis not present

## 2023-08-27 DIAGNOSIS — N189 Chronic kidney disease, unspecified: Secondary | ICD-10-CM | POA: Diagnosis not present

## 2023-08-27 DIAGNOSIS — N2581 Secondary hyperparathyroidism of renal origin: Secondary | ICD-10-CM | POA: Diagnosis not present

## 2023-08-27 DIAGNOSIS — R5383 Other fatigue: Secondary | ICD-10-CM | POA: Diagnosis not present

## 2023-08-28 LAB — LAB REPORT - SCANNED
Creatinine, POC: 236.8 mg/dL
EGFR: 31

## 2023-08-29 ENCOUNTER — Telehealth: Payer: Self-pay

## 2023-08-29 NOTE — Telephone Encounter (Signed)
Unsuccessful attempt to reach patient on preferred number listed in notes for scheduled AWV. Unable to leave message on voicemail. 

## 2023-10-08 ENCOUNTER — Ambulatory Visit: Admitting: Family Medicine

## 2023-10-08 NOTE — Progress Notes (Deleted)
    SUBJECTIVE:   CHIEF COMPLAINT / HPI: f/u  ***  PERTINENT  PMH / PSH: T2DM, HTN, HLD, CKD3B, BPH  OBJECTIVE:   There were no vitals taken for this visit.  ***  ASSESSMENT/PLAN:   Assessment & Plan  No follow-ups on file.  Ivin Marrow, MD [facility]

## 2023-10-14 ENCOUNTER — Ambulatory Visit: Admitting: Family Medicine

## 2023-10-17 ENCOUNTER — Other Ambulatory Visit: Payer: Self-pay | Admitting: Family Medicine

## 2023-10-17 DIAGNOSIS — L309 Dermatitis, unspecified: Secondary | ICD-10-CM

## 2023-10-21 ENCOUNTER — Ambulatory Visit: Admitting: Family Medicine

## 2023-10-21 NOTE — Progress Notes (Deleted)
    SUBJECTIVE:   CHIEF COMPLAINT / HPI: check-up  ***  PERTINENT  PMH / PSH: HTN, T2DM, CKD3  OBJECTIVE:   There were no vitals taken for this visit.  ***  ASSESSMENT/PLAN:   Assessment & Plan  No follow-ups on file.  Ivin Marrow, MD [facility]

## 2023-11-11 ENCOUNTER — Ambulatory Visit: Admitting: Family Medicine

## 2023-11-11 NOTE — Progress Notes (Deleted)
    SUBJECTIVE:   CHIEF COMPLAINT / HPI:   ***  PERTINENT  PMH / PSH: BPD, CKD3, T2DM, HLD, HTN  OBJECTIVE:   There were no vitals taken for this visit.  ***  ASSESSMENT/PLAN:   Assessment & Plan  No follow-ups on file.  Ivin Marrow, MD [facility]

## 2023-11-30 ENCOUNTER — Other Ambulatory Visit: Payer: Self-pay | Admitting: Family Medicine

## 2023-11-30 DIAGNOSIS — J309 Allergic rhinitis, unspecified: Secondary | ICD-10-CM

## 2024-03-05 DIAGNOSIS — N1832 Chronic kidney disease, stage 3b: Secondary | ICD-10-CM | POA: Diagnosis not present

## 2024-03-12 DIAGNOSIS — D631 Anemia in chronic kidney disease: Secondary | ICD-10-CM | POA: Diagnosis not present

## 2024-03-12 DIAGNOSIS — I129 Hypertensive chronic kidney disease with stage 1 through stage 4 chronic kidney disease, or unspecified chronic kidney disease: Secondary | ICD-10-CM | POA: Diagnosis not present

## 2024-03-12 DIAGNOSIS — N2581 Secondary hyperparathyroidism of renal origin: Secondary | ICD-10-CM | POA: Diagnosis not present

## 2024-03-12 DIAGNOSIS — N183 Chronic kidney disease, stage 3 unspecified: Secondary | ICD-10-CM | POA: Diagnosis not present

## 2024-04-09 ENCOUNTER — Other Ambulatory Visit: Payer: Self-pay | Admitting: Family Medicine

## 2024-04-09 DIAGNOSIS — E785 Hyperlipidemia, unspecified: Secondary | ICD-10-CM
